# Patient Record
Sex: Female | Born: 1950 | Race: White | Hispanic: No | Smoking: Current every day smoker
Health system: Southern US, Community
[De-identification: ages and names within clinical notes are randomized; demographics above are authoritative.]

## PROBLEM LIST (undated history)

## (undated) DIAGNOSIS — I2510 Atherosclerotic heart disease of native coronary artery without angina pectoris: Secondary | ICD-10-CM

## (undated) DIAGNOSIS — M1990 Unspecified osteoarthritis, unspecified site: Secondary | ICD-10-CM

## (undated) DIAGNOSIS — E7800 Pure hypercholesterolemia, unspecified: Secondary | ICD-10-CM

## (undated) DIAGNOSIS — E875 Hyperkalemia: Secondary | ICD-10-CM

## (undated) DIAGNOSIS — I10 Essential (primary) hypertension: Secondary | ICD-10-CM

## (undated) DIAGNOSIS — E119 Type 2 diabetes mellitus without complications: Secondary | ICD-10-CM

## (undated) DIAGNOSIS — I219 Acute myocardial infarction, unspecified: Secondary | ICD-10-CM

## (undated) DIAGNOSIS — C801 Malignant (primary) neoplasm, unspecified: Secondary | ICD-10-CM

## (undated) DIAGNOSIS — E559 Vitamin D deficiency, unspecified: Secondary | ICD-10-CM

## (undated) HISTORY — PX: ABDOMINAL HYSTERECTOMY: SHX81

## (undated) HISTORY — PX: OTHER SURGICAL HISTORY: SHX169

## (undated) HISTORY — PX: BUNIONECTOMY: SHX129

---

## 2003-10-25 ENCOUNTER — Emergency Department (HOSPITAL_COMMUNITY): Admission: EM | Admit: 2003-10-25 | Discharge: 2003-10-25 | Payer: Self-pay | Admitting: Emergency Medicine

## 2006-12-18 ENCOUNTER — Ambulatory Visit (HOSPITAL_COMMUNITY): Admission: RE | Admit: 2006-12-18 | Discharge: 2006-12-18 | Payer: Self-pay | Admitting: Internal Medicine

## 2007-01-04 ENCOUNTER — Other Ambulatory Visit: Admission: RE | Admit: 2007-01-04 | Discharge: 2007-01-04 | Payer: Self-pay | Admitting: Internal Medicine

## 2008-02-25 ENCOUNTER — Ambulatory Visit (HOSPITAL_COMMUNITY): Admission: RE | Admit: 2008-02-25 | Discharge: 2008-02-25 | Payer: Self-pay

## 2008-02-25 ENCOUNTER — Other Ambulatory Visit: Admission: RE | Admit: 2008-02-25 | Discharge: 2008-02-25 | Payer: Self-pay | Admitting: Internal Medicine

## 2010-09-25 ENCOUNTER — Encounter: Payer: Self-pay | Admitting: Internal Medicine

## 2014-01-29 ENCOUNTER — Other Ambulatory Visit (HOSPITAL_COMMUNITY): Payer: Self-pay | Admitting: Family Medicine

## 2014-01-29 DIAGNOSIS — Z0000 Encounter for general adult medical examination without abnormal findings: Secondary | ICD-10-CM

## 2014-02-02 ENCOUNTER — Ambulatory Visit (HOSPITAL_COMMUNITY): Payer: Self-pay

## 2014-02-06 ENCOUNTER — Ambulatory Visit (HOSPITAL_COMMUNITY)
Admission: RE | Admit: 2014-02-06 | Discharge: 2014-02-06 | Disposition: A | Discharge status: Home / Self Care | Payer: BC Managed Care – PPO | Source: Ambulatory Visit | Attending: Family Medicine | Admitting: Family Medicine

## 2014-02-06 DIAGNOSIS — Z0000 Encounter for general adult medical examination without abnormal findings: Secondary | ICD-10-CM

## 2014-02-06 DIAGNOSIS — Z1231 Encounter for screening mammogram for malignant neoplasm of breast: Secondary | ICD-10-CM | POA: Insufficient documentation

## 2014-04-16 ENCOUNTER — Telehealth: Payer: Self-pay

## 2014-04-16 NOTE — Telephone Encounter (Signed)
LMOM to call back

## 2014-04-21 NOTE — Telephone Encounter (Signed)
PT ws referred by Delman Cheadle , PA at [facility] for screening colonoscopy. She has not responded to letter or phone call. Letter to PCP.

## 2014-08-26 ENCOUNTER — Ambulatory Visit (HOSPITAL_COMMUNITY)
Admission: RE | Admit: 2014-08-26 | Discharge: 2014-08-26 | Disposition: A | Discharge status: Home / Self Care | Payer: BC Managed Care – PPO | Source: Ambulatory Visit | Attending: Family Medicine | Admitting: Family Medicine

## 2014-08-26 ENCOUNTER — Other Ambulatory Visit (HOSPITAL_COMMUNITY): Payer: Self-pay | Admitting: Family Medicine

## 2014-08-26 DIAGNOSIS — E119 Type 2 diabetes mellitus without complications: Secondary | ICD-10-CM | POA: Diagnosis not present

## 2014-08-26 DIAGNOSIS — F17290 Nicotine dependence, other tobacco product, uncomplicated: Secondary | ICD-10-CM

## 2014-08-26 DIAGNOSIS — E1165 Type 2 diabetes mellitus with hyperglycemia: Secondary | ICD-10-CM

## 2014-08-26 DIAGNOSIS — I10 Essential (primary) hypertension: Secondary | ICD-10-CM | POA: Insufficient documentation

## 2014-08-26 DIAGNOSIS — E782 Mixed hyperlipidemia: Secondary | ICD-10-CM

## 2014-08-26 DIAGNOSIS — IMO0002 Reserved for concepts with insufficient information to code with codable children: Secondary | ICD-10-CM

## 2014-08-26 DIAGNOSIS — R05 Cough: Secondary | ICD-10-CM | POA: Diagnosis not present

## 2015-03-06 ENCOUNTER — Encounter (HOSPITAL_COMMUNITY): Payer: Self-pay | Admitting: Emergency Medicine

## 2015-03-06 ENCOUNTER — Emergency Department (HOSPITAL_COMMUNITY)
Admission: EM | Admit: 2015-03-06 | Discharge: 2015-03-06 | Disposition: A | Discharge status: Home / Self Care | Payer: Self-pay | Attending: Emergency Medicine | Admitting: Emergency Medicine

## 2015-03-06 DIAGNOSIS — Y998 Other external cause status: Secondary | ICD-10-CM | POA: Insufficient documentation

## 2015-03-06 DIAGNOSIS — E119 Type 2 diabetes mellitus without complications: Secondary | ICD-10-CM | POA: Insufficient documentation

## 2015-03-06 DIAGNOSIS — Y9389 Activity, other specified: Secondary | ICD-10-CM | POA: Insufficient documentation

## 2015-03-06 DIAGNOSIS — Y9289 Other specified places as the place of occurrence of the external cause: Secondary | ICD-10-CM | POA: Insufficient documentation

## 2015-03-06 DIAGNOSIS — Z720 Tobacco use: Secondary | ICD-10-CM | POA: Insufficient documentation

## 2015-03-06 DIAGNOSIS — M795 Residual foreign body in soft tissue: Secondary | ICD-10-CM

## 2015-03-06 DIAGNOSIS — I10 Essential (primary) hypertension: Secondary | ICD-10-CM | POA: Insufficient documentation

## 2015-03-06 DIAGNOSIS — S70361A Insect bite (nonvenomous), right thigh, initial encounter: Secondary | ICD-10-CM | POA: Insufficient documentation

## 2015-03-06 DIAGNOSIS — W57XXXA Bitten or stung by nonvenomous insect and other nonvenomous arthropods, initial encounter: Secondary | ICD-10-CM | POA: Insufficient documentation

## 2015-03-06 HISTORY — DX: Type 2 diabetes mellitus without complications: E11.9

## 2015-03-06 HISTORY — DX: Essential (primary) hypertension: I10

## 2015-03-06 HISTORY — DX: Pure hypercholesterolemia, unspecified: E78.00

## 2015-03-06 MED ORDER — DOXYCYCLINE HYCLATE 100 MG PO CAPS: 100.0000 mg | ORAL_CAPSULE | Freq: Two times a day (BID) | ORAL | Status: DC

## 2015-03-06 NOTE — ED Notes (Signed)
Pt reports removed a tick from left thigh today. Pt reports pain to left thigh ever since. Small area of redness noted to anterior thigh.

## 2015-03-06 NOTE — Discharge Instructions (Signed)
Tick Bite Information Ticks are insects that attach themselves to the skin and draw blood for food. There are various types of ticks. Common types include wood ticks and deer ticks. Most ticks live in shrubs and grassy areas. Ticks can climb onto your body when you make contact with leaves or grass where the tick is waiting. The most common places on the body for ticks to attach themselves are the scalp, neck, armpits, waist, and groin. Most tick bites are harmless, but sometimes ticks carry germs that cause diseases. These germs can be spread to a person during the tick's feeding process. The chance of a disease spreading through a tick bite depends on:   The type of tick.  Time of year.   How long the tick is attached.   Geographic location.  HOW CAN YOU PREVENT TICK BITES? Take these steps to help prevent tick bites when you are outdoors:  Wear protective clothing. Long sleeves and long pants are best.   Wear white clothes so you can see ticks more easily.  Tuck your pant legs into your socks.   If walking on a trail, stay in the middle of the trail to avoid brushing against bushes.  Avoid walking through areas with long grass.  Put insect repellent on all exposed skin and along boot tops, pant legs, and sleeve cuffs.   Check clothing, hair, and skin repeatedly and before going inside.   Brush off any ticks that are not attached.  Take a shower or bath as soon as possible after being outdoors.  WHAT IS THE PROPER WAY TO REMOVE A TICK? Ticks should be removed as soon as possible to help prevent diseases caused by tick bites. 1. If latex gloves are available, put them on before trying to remove a tick.  2. Using fine-point tweezers, grasp the tick as close to the skin as possible. You may also use curved forceps or a tick removal tool. Grasp the tick as close to its head as possible. Avoid grasping the tick on its body. 3. Pull gently with steady upward pressure until  the tick lets go. Do not twist the tick or jerk it suddenly. This may break off the tick's head or mouth parts. 4. Do not squeeze or crush the tick's body. This could force disease-carrying fluids from the tick into your body.  5. After the tick is removed, wash the bite area and your hands with soap and water or other disinfectant such as alcohol. 6. Apply a small amount of antiseptic cream or ointment to the bite site.  7. Wash and disinfect any instruments that were used.  Do not try to remove a tick by applying a hot match, petroleum jelly, or fingernail polish to the tick. These methods do not work and may increase the chances of disease being spread from the tick bite.  WHEN SHOULD YOU SEEK MEDICAL CARE? Contact your health care provider if you are unable to remove a tick from your skin or if a part of the tick breaks off and is stuck in the skin.  After a tick bite, you need to be aware of signs and symptoms that could be related to diseases spread by ticks. Contact your health care provider if you develop any of the following in the days or weeks after the tick bite:  Unexplained fever.  Rash. A circular rash that appears days or weeks after the tick bite may indicate the possibility of Lyme disease. The rash may resemble   a target with a bull's-eye and may occur at a different part of your body than the tick bite.  Redness and swelling in the area of the tick bite.   Tender, swollen lymph glands.   Diarrhea.   Weight loss.   Cough.   Fatigue.   Muscle, joint, or bone pain.   Abdominal pain.   Headache.   Lethargy or a change in your level of consciousness.  Difficulty walking or moving your legs.   Numbness in the legs.   Paralysis.  Shortness of breath.   Confusion.   Repeated vomiting.  Document Released: 08/18/2000 Document Revised: 06/11/2013 Document Reviewed: 01/29/2013 [employer] Patient Information 2015 [employer]. This information is  not intended to replace advice given to you by your health care provider. Make sure you discuss any questions you have with your health care provider.  

## 2015-03-07 NOTE — ED Provider Notes (Signed)
CSN: [id number]     Arrival date & time 03/06/15  1656 History   First MD Initiated Contact with Patient 03/06/15 1714     Chief Complaint  Patient presents with  . Insect Bite     (Consider location/radiation/quality/duration/timing/severity/associated sxs/prior Treatment) The history is provided by the patient.   Lisa Jenkins is a 64 y.o. female with a history of DM and HTN presenting for evaluation of tick bite to her left upper thigh. She discovered the firmly embedded tick earlier this evening and removed it using a tweezers but is concerned about possible retained pincher.  She has pain and redness around the site of the bite.  She denies fever but had chills this am, denies rash, headache, nausea or vomiting.  Her cbg today was 49.     Past Medical History  Diagnosis Date  . Diabetes mellitus without complication   . High cholesterol   . Hypertension    History reviewed. No pertinent past surgical history. History reviewed. No pertinent family history. History  Substance Use Topics  . Smoking status: Current Every Day Smoker -- 0.50 packs/day  . Smokeless tobacco: Not on file  . Alcohol Use: No   OB History    No data available     Review of Systems  Constitutional: Positive for chills. Negative for fever.  HENT: Negative.  Negative for sore throat.   Respiratory: Negative for shortness of breath.   Cardiovascular: Negative for chest pain.  Gastrointestinal: Negative for nausea and abdominal pain.  Genitourinary: Negative.   Musculoskeletal: Negative for joint swelling and arthralgias.  Skin: Positive for wound. Negative for rash.  Neurological: Negative for weakness, light-headedness, numbness and headaches.  Psychiatric/Behavioral: Negative.       Allergies  Review of patient's allergies indicates not on file.  Home Medications   Prior to Admission medications   Medication Sig Start Date End Date Taking? Authorizing Provider  doxycycline (VIBRAMYCIN)  100 MG capsule Take 1 capsule (100 mg total) by mouth 2 (two) times daily. 03/06/15   Lisa Jefferson, PA-C   BP 148/73 mmHg  Pulse 112  Temp(Src) 98.6 F (37 C) (Oral)  Resp 18  Ht 5\' 2"  (1.575 m)  Wt 165 lb (74.844 kg)  BMI 30.17 kg/m2  SpO2 98% Physical Exam  Constitutional: She appears well-developed and well-nourished. No distress.  HENT:  Head: Normocephalic.  Neck: Neck supple.  Cardiovascular: Normal rate.   Pulmonary/Chest: Effort normal. She has no wheezes.  Musculoskeletal: Normal range of motion. She exhibits no edema.  Skin: There is erythema.  Indurated lesion left upper anterior thigh with a non blanching 2 cm surrounding area of erythema.  Central punctum has a dark suspected fb.  No drainage, no red streaking.    ED Course  FOREIGN BODY REMOVAL Date/Time: 03/06/2015 5:30 PM Performed by: Lisa Jefferson Authorized by: Lisa Jefferson Consent: Verbal consent obtained. Risks and benefits: risks, benefits and alternatives were discussed Consent given by: patient Patient identity confirmed: verbally with patient Body area: skin Anesthesia method: none. Patient sedated: no Localization method: magnification Removal mechanism: forceps Depth: subcutaneous Complexity: simple 1 objects recovered. Objects recovered: tick pincher Post-procedure assessment: foreign body removed Patient tolerance: Patient tolerated the procedure well with no immediate complications   (including critical care time) Labs Review Labs Reviewed - No data to display  Imaging Review No results found.   EKG Interpretation None      MDM   Final diagnoses:  Tick bite  Foreign body (FB) in  soft tissue    Pt tolerated removal of foreign body well.  She was placed on doxycycline. Advised to use soap and water to keep site clean.  Prn f/u anticipated.  Advised f/u with pcp for any worsened or new sx.    Lisa Jefferson, PA-C 03/07/15 [place], DO 03/09/15 1622

## 2015-07-21 ENCOUNTER — Other Ambulatory Visit: Payer: Self-pay | Admitting: Physician Assistant

## 2015-07-21 ENCOUNTER — Ambulatory Visit: Payer: Self-pay | Admitting: Physician Assistant

## 2015-07-21 ENCOUNTER — Encounter: Payer: Self-pay | Admitting: Physician Assistant

## 2015-07-21 VITALS — BP 144/72 | HR 78 | Temp 98.1°F | Ht 62.0 in | Wt 164.0 lb

## 2015-07-21 DIAGNOSIS — E785 Hyperlipidemia, unspecified: Secondary | ICD-10-CM | POA: Insufficient documentation

## 2015-07-21 DIAGNOSIS — E118 Type 2 diabetes mellitus with unspecified complications: Secondary | ICD-10-CM

## 2015-07-21 DIAGNOSIS — F17210 Nicotine dependence, cigarettes, uncomplicated: Secondary | ICD-10-CM | POA: Insufficient documentation

## 2015-07-21 DIAGNOSIS — E11319 Type 2 diabetes mellitus with unspecified diabetic retinopathy without macular edema: Secondary | ICD-10-CM | POA: Insufficient documentation

## 2015-07-21 DIAGNOSIS — I10 Essential (primary) hypertension: Secondary | ICD-10-CM | POA: Insufficient documentation

## 2015-07-21 DIAGNOSIS — R809 Proteinuria, unspecified: Secondary | ICD-10-CM

## 2015-07-21 DIAGNOSIS — E039 Hypothyroidism, unspecified: Secondary | ICD-10-CM | POA: Insufficient documentation

## 2015-07-21 MED ORDER — AMLODIPINE BESYLATE 5 MG PO TABS: 5.0000 mg | ORAL_TABLET | Freq: Every day | ORAL | Status: DC

## 2015-07-21 NOTE — Progress Notes (Signed)
BP 144/72 mmHg  Pulse 78  Temp(Src) 98.1 F (36.7 C)  Ht 5\' 2"  (1.575 m)  Wt 164 lb (74.39 kg)  BMI 29.99 kg/m2  SpO2 97%   Subjective:    Patient ID: Lisa Jenkins, female    DOB: 1951/08/29, 64 y.o.   MRN: [id number]  HPI: Lisa Jenkins is a 64 y.o. female presenting on 07/21/2015 for Diabetes and Hypothyroidism   HPI   Pt is taking 2 different rx of levothyroxin. Pt got labs drawn on way to her appt here.  Relevant past medical, surgical, family and social history reviewed and updated as indicated. Interim medical history since our last visit reviewed. Allergies and medications reviewed and updated.   Current outpatient prescriptions:  .  aspirin 81 MG chewable tablet, Chew by mouth daily., Disp: , Rfl:  .  insulin aspart (NOVOLOG) 100 UNIT/ML injection, Inject into the skin 3 (three) times daily before meals., Disp: , Rfl:  .  insulin glargine (LANTUS) 100 UNIT/ML injection, Inject 14 Units into the skin at bedtime., Disp: , Rfl:  .  levothyroxine (SYNTHROID, LEVOTHROID) 25 MCG tablet, Take 25 mcg by mouth daily before breakfast., Disp: , Rfl:  .  lisinopril (PRINIVIL,ZESTRIL) 40 MG tablet, Take 40 mg by mouth daily., Disp: , Rfl:  .  rosuvastatin (CRESTOR) 20 MG tablet, Take 20 mg by mouth Nightly., Disp: , Rfl:  .  sitaGLIPtin-metformin (JANUMET) 50-1000 MG tablet, Take 1 tablet by mouth 2 (two) times daily with a meal., Disp: , Rfl:   Review of Systems  Constitutional: Negative for fever, chills, diaphoresis, appetite change, fatigue and unexpected weight change.  HENT: Negative for congestion, dental problem, drooling, ear pain, facial swelling, hearing loss, mouth sores, sneezing, sore throat, trouble swallowing and voice change.   Eyes: Positive for visual disturbance. Negative for pain, discharge, redness and itching.  Respiratory: Positive for wheezing. Negative for cough, choking and shortness of breath.   Cardiovascular: Positive for leg swelling. Negative for  chest pain and palpitations.  Gastrointestinal: Negative for vomiting, abdominal pain, diarrhea, constipation and blood in stool.  Endocrine: Negative for cold intolerance, heat intolerance and polydipsia.  Genitourinary: Negative for dysuria, hematuria and decreased urine volume.  Musculoskeletal: Negative for back pain, arthralgias and gait problem.  Skin: Negative for rash.  Allergic/Immunologic: Negative for environmental allergies.  Neurological: Negative for seizures, syncope, light-headedness and headaches.  Hematological: Negative for adenopathy.  Psychiatric/Behavioral: Negative for suicidal ideas, dysphoric mood and agitation. The patient is not nervous/anxious.     Per HPI unless specifically indicated above     Objective:    BP 144/72 mmHg  Pulse 78  Temp(Src) 98.1 F (36.7 C)  Ht 5\' 2"  (1.575 m)  Wt 164 lb (74.39 kg)  BMI 29.99 kg/m2  SpO2 97%  Wt Readings from Last 3 Encounters:  07/21/15 164 lb (74.39 kg)  03/06/15 165 lb (74.844 kg)    Physical Exam  Constitutional: She is oriented to person, place, and time. She appears well-developed and well-nourished.  HENT:  Head: Normocephalic and atraumatic.  Neck: Neck supple.  Cardiovascular: Normal rate and regular rhythm.   Pulmonary/Chest: Effort normal and breath sounds normal.  Abdominal: Soft. Bowel sounds are normal. She exhibits no mass. There is no tenderness.  Lymphadenopathy:    She has no cervical adenopathy.  Neurological: She is alert and oriented to person, place, and time.  Skin: Skin is warm and dry.  Psychiatric: She has a normal mood and affect. Her behavior is  normal.  Vitals reviewed.  Foot exam done  No results found for this or any previous visit.    Assessment & Plan:   Encounter Diagnoses  Name Primary?  . Type 2 diabetes mellitus with complication, unspecified long term insulin use status ([place]) Yes  . Type 2 diabetes mellitus with retinopathy, macular edema presence unspecified,  unspecified laterality, unspecified long term insulin use status, unspecified retinopathy severity ([place])   . Hyperlipemia   . Essential hypertension, benign   . Cigarette nicotine dependence, uncomplicated   . Proteinuria   . Hypothyroidism, unspecified hypothyroidism type     -Pt counseled to stop the other thryoid med (the duplicate) -Cont current meds -Will call with lab results -Counseled smoking cessation -Add amlodipine for bp  -Pt still needs to attend dm educ class (has been pt here since January 2016 and has still not attended) -F/u 3 mo

## 2015-07-21 NOTE — Patient Instructions (Signed)
Smoking Cessation, Tips for Success If you are ready to quit smoking, congratulations! You have chosen to help yourself be healthier. Cigarettes bring nicotine, tar, carbon monoxide, and other irritants into your body. Your lungs, heart, and blood vessels will be able to work better without these poisons. There are many different ways to quit smoking. Nicotine gum, nicotine patches, a nicotine inhaler, or nicotine nasal spray can help with physical craving. Hypnosis, support groups, and medicines help break the habit of smoking. WHAT THINGS CAN I DO TO MAKE QUITTING EASIER?  Here are some tips to help you quit for good:  Pick a date when you will quit smoking completely. Tell all of your friends and family about your plan to quit on that date.  Do not try to slowly cut down on the number of cigarettes you are smoking. Pick a quit date and quit smoking completely starting on that day.  Throw away all cigarettes.   Clean and remove all ashtrays from your home, work, and car.  On a card, write down your reasons for quitting. Carry the card with you and read it when you get the urge to smoke.  Cleanse your body of nicotine. Drink enough water and fluids to keep your urine clear or pale yellow. Do this after quitting to flush the nicotine from your body.  Learn to predict your moods. Do not let a bad situation be your excuse to have a cigarette. Some situations in your life might tempt you into wanting a cigarette.  Never have "just one" cigarette. It leads to wanting another and another. Remind yourself of your decision to quit.  Change habits associated with smoking. If you smoked while driving or when feeling stressed, try other activities to replace smoking. Stand up when drinking your coffee. Brush your teeth after eating. Sit in a different chair when you read the paper. Avoid alcohol while trying to quit, and try to drink fewer caffeinated beverages. Alcohol and caffeine may urge you to  smoke.  Avoid foods and drinks that can trigger a desire to smoke, such as sugary or spicy foods and alcohol.  Ask people who smoke not to smoke around you.  Have something planned to do right after eating or having a cup of coffee. For example, plan to take a walk or exercise.  Try a relaxation exercise to calm you down and decrease your stress. Remember, you may be tense and nervous for the first 2 weeks after you quit, but this will pass.  Find new activities to keep your hands busy. Play with a pen, coin, or rubber band. Doodle or draw things on paper.  Brush your teeth right after eating. This will help cut down on the craving for the taste of tobacco after meals. You can also try mouthwash.   Use oral substitutes in place of cigarettes. Try using lemon drops, carrots, cinnamon sticks, or chewing gum. Keep them handy so they are available when you have the urge to smoke.  When you have the urge to smoke, try deep breathing.  Designate your home as a nonsmoking area.  If you are a heavy smoker, ask your health care provider about a prescription for nicotine chewing gum. It can ease your withdrawal from nicotine.  Reward yourself. Set aside the cigarette money you save and buy yourself something nice.  Look for support from others. Join a support group or smoking cessation program. Ask someone at home or at work to help you with your plan   to quit smoking.  Always ask yourself, "Do I need this cigarette or is this just a reflex?" Tell yourself, "Today, I choose not to smoke," or "I do not want to smoke." You are reminding yourself of your decision to quit.  Do not replace cigarette smoking with electronic cigarettes (commonly called e-cigarettes). The safety of e-cigarettes is unknown, and some may contain harmful chemicals.  If you relapse, do not give up! Plan ahead and think about what you will do the next time you get the urge to smoke. HOW WILL I FEEL WHEN I QUIT SMOKING? You  may have symptoms of withdrawal because your body is used to nicotine (the addictive substance in cigarettes). You may crave cigarettes, be irritable, feel very hungry, cough often, get headaches, or have difficulty concentrating. The withdrawal symptoms are only temporary. They are strongest when you first quit but will go away within 10-14 days. When withdrawal symptoms occur, stay in control. Think about your reasons for quitting. Remind yourself that these are signs that your body is healing and getting used to being without cigarettes. Remember that withdrawal symptoms are easier to treat than the major diseases that smoking can cause.  Even after the withdrawal is over, expect periodic urges to smoke. However, these cravings are generally short lived and will go away whether you smoke or not. Do not smoke! WHAT RESOURCES ARE AVAILABLE TO HELP ME QUIT SMOKING? Your health care provider can direct you to community resources or hospitals for support, which may include:  Group support.  Education.  Hypnosis.  Therapy.   This information is not intended to replace advice given to you by your health care provider. Make sure you discuss any questions you have with your health care provider.   Document Released: 05/19/2004 Document Revised: 09/11/2014 Document Reviewed: 02/06/2013 Elsevier Interactive Patient Education 2016 Else[employer] Inc.  

## 2015-07-22 ENCOUNTER — Ambulatory Visit: Payer: Self-pay | Admitting: Physician Assistant

## 2015-07-22 LAB — COMPREHENSIVE METABOLIC PANEL
ALBUMIN: 3.5 g/dL — AB (ref 3.6–5.1)
ALK PHOS: 70 U/L (ref 33–130)
ALT: 6 U/L (ref 6–29)
AST: 10 U/L (ref 10–35)
BILIRUBIN TOTAL: 0.5 mg/dL (ref 0.2–1.2)
BUN: 8 mg/dL (ref 7–25)
CO2: 26 mmol/L (ref 20–31)
CREATININE: 0.62 mg/dL (ref 0.50–0.99)
Calcium: 9.1 mg/dL (ref 8.6–10.4)
Chloride: 103 mmol/L (ref 98–110)
Glucose, Bld: 127 mg/dL — ABNORMAL HIGH (ref 65–99)
Potassium: 4.4 mmol/L (ref 3.5–5.3)
SODIUM: 138 mmol/L (ref 135–146)
TOTAL PROTEIN: 6 g/dL — AB (ref 6.1–8.1)

## 2015-07-22 LAB — LIPID PANEL
CHOLESTEROL: 135 mg/dL (ref 125–200)
HDL: 47 mg/dL (ref >=46)
LDL Cholesterol: 61 mg/dL (ref <130)
TRIGLYCERIDES: 136 mg/dL (ref <150)
Total CHOL/HDL Ratio: 2.9 Ratio (ref <=5.0)
VLDL: 27 mg/dL (ref <30)

## 2015-07-22 LAB — TSH: TSH: 1.96 u[IU]/mL (ref 0.350–4.500)

## 2015-07-22 LAB — HEMOGLOBIN A1C
HEMOGLOBIN A1C: 8.1 % — AB (ref <5.7)
MEAN PLASMA GLUCOSE: 186 mg/dL — AB (ref <117)

## 2015-08-01 DIAGNOSIS — E118 Type 2 diabetes mellitus with unspecified complications: Secondary | ICD-10-CM | POA: Insufficient documentation

## 2015-08-17 ENCOUNTER — Other Ambulatory Visit: Payer: Self-pay | Admitting: Physician Assistant

## 2015-08-18 ENCOUNTER — Other Ambulatory Visit: Payer: Self-pay | Admitting: Physician Assistant

## 2015-08-18 MED ORDER — ATORVASTATIN CALCIUM 20 MG PO TABS: 20.0000 mg | ORAL_TABLET | Freq: Every day | ORAL | Status: DC

## 2015-10-15 ENCOUNTER — Other Ambulatory Visit: Payer: Self-pay | Admitting: Physician Assistant

## 2015-10-21 ENCOUNTER — Encounter: Payer: Self-pay | Admitting: Physician Assistant

## 2015-10-21 ENCOUNTER — Ambulatory Visit: Payer: Self-pay | Admitting: Physician Assistant

## 2015-10-21 VITALS — BP 122/64 | HR 85 | Temp 98.1°F | Ht 62.0 in | Wt 161.7 lb

## 2015-10-21 DIAGNOSIS — E118 Type 2 diabetes mellitus with unspecified complications: Principal | ICD-10-CM

## 2015-10-21 DIAGNOSIS — F17210 Nicotine dependence, cigarettes, uncomplicated: Secondary | ICD-10-CM

## 2015-10-21 DIAGNOSIS — E785 Hyperlipidemia, unspecified: Secondary | ICD-10-CM

## 2015-10-21 DIAGNOSIS — I10 Essential (primary) hypertension: Secondary | ICD-10-CM

## 2015-10-21 DIAGNOSIS — Z1211 Encounter for screening for malignant neoplasm of colon: Secondary | ICD-10-CM

## 2015-10-21 DIAGNOSIS — E039 Hypothyroidism, unspecified: Secondary | ICD-10-CM

## 2015-10-21 DIAGNOSIS — E1165 Type 2 diabetes mellitus with hyperglycemia: Secondary | ICD-10-CM

## 2015-10-21 DIAGNOSIS — IMO0002 Reserved for concepts with insufficient information to code with codable children: Secondary | ICD-10-CM | POA: Insufficient documentation

## 2015-10-21 NOTE — Patient Instructions (Signed)
GET BLOOD DRAWN TODAY OR TOMORROW  Smoking Cessation, Tips for Success If you are ready to quit smoking, congratulations! You have chosen to help yourself be healthier. Cigarettes bring nicotine, tar, carbon monoxide, and other irritants into your body. Your lungs, heart, and blood vessels will be able to work better without these poisons. There are many different ways to quit smoking. Nicotine gum, nicotine patches, a nicotine inhaler, or nicotine nasal spray can help with physical craving. Hypnosis, support groups, and medicines help break the habit of smoking. WHAT THINGS CAN I DO TO MAKE QUITTING EASIER?  Here are some tips to help you quit for good:  Pick a date when you will quit smoking completely. Tell all of your friends and family about your plan to quit on that date.  Do not try to slowly cut down on the number of cigarettes you are smoking. Pick a quit date and quit smoking completely starting on that day.  Throw away all cigarettes.   Clean and remove all ashtrays from your home, work, and car.  On a card, write down your reasons for quitting. Carry the card with you and read it when you get the urge to smoke.  Cleanse your body of nicotine. Drink enough water and fluids to keep your urine clear or pale yellow. Do this after quitting to flush the nicotine from your body.  Learn to predict your moods. Do not let a bad situation be your excuse to have a cigarette. Some situations in your life might tempt you into wanting a cigarette.  Never have "just one" cigarette. It leads to wanting another and another. Remind yourself of your decision to quit.  Change habits associated with smoking. If you smoked while driving or when feeling stressed, try other activities to replace smoking. Stand up when drinking your coffee. Brush your teeth after eating. Sit in a different chair when you read the paper. Avoid alcohol while trying to quit, and try to drink fewer caffeinated beverages.  Alcohol and caffeine may urge you to smoke.  Avoid foods and drinks that can trigger a desire to smoke, such as sugary or spicy foods and alcohol.  Ask people who smoke not to smoke around you.  Have something planned to do right after eating or having a cup of coffee. For example, plan to take a walk or exercise.  Try a relaxation exercise to calm you down and decrease your stress. Remember, you may be tense and nervous for the first 2 weeks after you quit, but this will pass.  Find new activities to keep your hands busy. Play with a pen, coin, or rubber band. Doodle or draw things on paper.  Brush your teeth right after eating. This will help cut down on the craving for the taste of tobacco after meals. You can also try mouthwash.   Use oral substitutes in place of cigarettes. Try using lemon drops, carrots, cinnamon sticks, or chewing gum. Keep them handy so they are available when you have the urge to smoke.  When you have the urge to smoke, try deep breathing.  Designate your home as a nonsmoking area.  If you are a heavy smoker, ask your health care provider about a prescription for nicotine chewing gum. It can ease your withdrawal from nicotine.  Reward yourself. Set aside the cigarette money you save and buy yourself something nice.  Look for support from others. Join a support group or smoking cessation program. Ask someone at home or at  work to help you with your plan to quit smoking.  Always ask yourself, "Do I need this cigarette or is this just a reflex?" Tell yourself, "Today, I choose not to smoke," or "I do not want to smoke." You are reminding yourself of your decision to quit.  Do not replace cigarette smoking with electronic cigarettes (commonly called e-cigarettes). The safety of e-cigarettes is unknown, and some may contain harmful chemicals.  If you relapse, do not give up! Plan ahead and think about what you will do the next time you get the urge to smoke. HOW  WILL I FEEL WHEN I QUIT SMOKING? You may have symptoms of withdrawal because your body is used to nicotine (the addictive substance in cigarettes). You may crave cigarettes, be irritable, feel very hungry, cough often, get headaches, or have difficulty concentrating. The withdrawal symptoms are only temporary. They are strongest when you first quit but will go away within 10-14 days. When withdrawal symptoms occur, stay in control. Think about your reasons for quitting. Remind yourself that these are signs that your body is healing and getting used to being without cigarettes. Remember that withdrawal symptoms are easier to treat than the major diseases that smoking can cause.  Even after the withdrawal is over, expect periodic urges to smoke. However, these cravings are generally short lived and will go away whether you smoke or not. Do not smoke! WHAT RESOURCES ARE AVAILABLE TO HELP ME QUIT SMOKING? Your health care provider can direct you to community resources or hospitals for support, which may include:  Group support.  Education.  Hypnosis.  Therapy.   This information is not intended to replace advice given to you by your health care provider. Make sure you discuss any questions you have with your health care provider.   Document Released: 05/19/2004 Document Revised: 09/11/2014 Document Reviewed: 02/06/2013 Elsevier Interactive Patient Education [employer].

## 2015-10-21 NOTE — Progress Notes (Signed)
BP 122/64 mmHg  Pulse 85  Temp(Src) 98.1 F (36.7 C)  Ht 5\' 2"  (1.575 m)  Wt 161 lb 11.2 oz (73.347 kg)  BMI 29.57 kg/m2  SpO2 99%   Subjective:    Patient ID: Lisa Jenkins, female    DOB: 10-15-1950, 65 y.o.   MRN: [id number]  HPI: Lisa Jenkins is a 65 y.o. female presenting on 10/21/2015 for Diabetes and Hypertension   HPI   Pt feeling well today and has no new complaints Reviewed bs logs- look good- mostly in lower 100-120s.  Relevant past medical, surgical, family and social history reviewed and updated as indicated. Interim medical history since our last visit reviewed. Allergies and medications reviewed and updated.   Current outpatient prescriptions:  .  amLODipine (NORVASC) 5 MG tablet, Take 1 tablet (5 mg total) by mouth daily., Disp: 90 tablet, Rfl: 2 .  aspirin 81 MG chewable tablet, Chew by mouth daily., Disp: , Rfl:  .  atorvastatin (LIPITOR) 20 MG tablet, Take 1 tablet (20 mg total) by mouth daily., Disp: 90 tablet, Rfl: 3 .  insulin aspart (NOVOLOG) 100 UNIT/ML injection, Inject into the skin 3 (three) times daily before meals. Sliding scalte- (scale 3, Disp: , Rfl:  .  insulin glargine (LANTUS) 100 UNIT/ML injection, Inject 14 Units into the skin at bedtime., Disp: , Rfl:  .  levothyroxine (SYNTHROID, LEVOTHROID) 25 MCG tablet, Take 25 mcg by mouth daily before breakfast., Disp: , Rfl:  .  lisinopril (PRINIVIL,ZESTRIL) 40 MG tablet, TAKE 1 Tablet BY MOUTH ONCE DAILY FOR BLOOD PRESSURE, Disp: 90 tablet, Rfl: 1 .  sitaGLIPtin-metformin (JANUMET) 50-1000 MG tablet, Take 1 tablet by mouth 2 (two) times daily with a meal., Disp: , Rfl:    Review of Systems  Constitutional: Positive for chills. Negative for fever, diaphoresis, appetite change, fatigue and unexpected weight change.  HENT: Negative for congestion, dental problem, drooling, ear pain, facial swelling, hearing loss, mouth sores, sneezing, sore throat, trouble swallowing and voice change.   Eyes: Positive  for visual disturbance. Negative for pain, discharge, redness and itching.  Respiratory: Positive for cough. Negative for choking, shortness of breath and wheezing.   Cardiovascular: Positive for leg swelling. Negative for chest pain and palpitations.  Gastrointestinal: Negative for vomiting, abdominal pain, diarrhea, constipation and blood in stool.  Endocrine: Positive for polydipsia. Negative for cold intolerance and heat intolerance.  Genitourinary: Negative for dysuria, hematuria and decreased urine volume.  Musculoskeletal: Positive for arthralgias. Negative for back pain and gait problem.  Skin: Negative for rash.  Allergic/Immunologic: Negative for environmental allergies.  Neurological: Negative for seizures, syncope, light-headedness and headaches.  Hematological: Negative for adenopathy.  Psychiatric/Behavioral: Negative for suicidal ideas, dysphoric mood and agitation. The patient is not nervous/anxious.     Per HPI unless specifically indicated above     Objective:    BP 122/64 mmHg  Pulse 85  Temp(Src) 98.1 F (36.7 C)  Ht 5\' 2"  (1.575 m)  Wt 161 lb 11.2 oz (73.347 kg)  BMI 29.57 kg/m2  SpO2 99%  Wt Readings from Last 3 Encounters:  10/21/15 161 lb 11.2 oz (73.347 kg)  07/21/15 164 lb (74.39 kg)  03/06/15 165 lb (74.844 kg)    Physical Exam  Constitutional: She is oriented to person, place, and time. She appears well-developed and well-nourished.  HENT:  Head: Normocephalic and atraumatic.  Neck: Neck supple.  Cardiovascular: Normal rate and regular rhythm.   Pulmonary/Chest: Effort normal and breath sounds normal.  Abdominal: Soft.  Bowel sounds are normal. She exhibits no mass. There is no hepatosplenomegaly. There is no tenderness.  Musculoskeletal: She exhibits no edema.  Lymphadenopathy:    She has no cervical adenopathy.  Neurological: She is alert and oriented to person, place, and time.  Skin: Skin is warm and dry.  Psychiatric: She has a normal mood  and affect. Her behavior is normal.  Vitals reviewed.       Assessment & Plan:   Encounter Diagnoses  Name Primary?  Marland Kitchen Uncontrolled type 2 diabetes mellitus with complication, unspecified long term insulin use status ([place]) Yes  . Essential hypertension, benign   . Screening for colon cancer   . Hypothyroidism, unspecified hypothyroidism type   . Hyperlipidemia   . Cigarette nicotine dependence, uncomplicated     -iFOBT given for colon cancer screening -pt still needs to attend DM educ class -Get labs drawn today- a1c and microalbumin- will call with results -counseled on smoking cessation -F/u 3 months.  RTO sooner prn

## 2015-11-08 ENCOUNTER — Other Ambulatory Visit: Payer: Self-pay | Admitting: Physician Assistant

## 2015-11-08 MED ORDER — SITAGLIPTIN PHOS-METFORMIN HCL 50-1000 MG PO TABS: 1.0000 | ORAL_TABLET | Freq: Two times a day (BID) | ORAL | Status: DC

## 2015-11-08 MED ORDER — LEVOTHYROXINE SODIUM 25 MCG PO TABS: 25.0000 ug | ORAL_TABLET | Freq: Every day | ORAL | Status: DC

## 2015-11-08 MED ORDER — AMLODIPINE BESYLATE 5 MG PO TABS: 5.0000 mg | ORAL_TABLET | Freq: Every day | ORAL | Status: DC

## 2015-11-08 MED ORDER — ATORVASTATIN CALCIUM 20 MG PO TABS: 20.0000 mg | ORAL_TABLET | Freq: Every day | ORAL | Status: DC

## 2016-01-11 ENCOUNTER — Other Ambulatory Visit: Payer: Self-pay

## 2016-01-11 DIAGNOSIS — I10 Essential (primary) hypertension: Secondary | ICD-10-CM

## 2016-01-11 DIAGNOSIS — E1165 Type 2 diabetes mellitus with hyperglycemia: Secondary | ICD-10-CM

## 2016-01-11 DIAGNOSIS — E118 Type 2 diabetes mellitus with unspecified complications: Principal | ICD-10-CM

## 2016-01-11 DIAGNOSIS — E785 Hyperlipidemia, unspecified: Secondary | ICD-10-CM

## 2016-01-19 ENCOUNTER — Ambulatory Visit: Payer: Self-pay | Admitting: Physician Assistant

## 2016-02-21 ENCOUNTER — Ambulatory Visit: Payer: Self-pay | Admitting: Physician Assistant

## 2016-03-09 ENCOUNTER — Encounter: Payer: Self-pay | Admitting: Physician Assistant

## 2016-05-25 ENCOUNTER — Other Ambulatory Visit: Payer: Self-pay | Admitting: Physician Assistant

## 2016-05-28 ENCOUNTER — Other Ambulatory Visit: Payer: Self-pay | Admitting: Physician Assistant

## 2016-05-28 DIAGNOSIS — E1165 Type 2 diabetes mellitus with hyperglycemia: Secondary | ICD-10-CM

## 2016-05-28 DIAGNOSIS — I10 Essential (primary) hypertension: Secondary | ICD-10-CM

## 2016-05-28 DIAGNOSIS — E785 Hyperlipidemia, unspecified: Secondary | ICD-10-CM

## 2016-05-28 DIAGNOSIS — E118 Type 2 diabetes mellitus with unspecified complications: Principal | ICD-10-CM

## 2016-05-28 DIAGNOSIS — E039 Hypothyroidism, unspecified: Secondary | ICD-10-CM

## 2016-05-30 LAB — LIPID PANEL
Cholesterol: 149 mg/dL (ref 125–200)
HDL: 43 mg/dL — ABNORMAL LOW (ref >=46)
LDL CALC: 89 mg/dL (ref <130)
Total CHOL/HDL Ratio: 3.5 Ratio (ref <=5.0)
Triglycerides: 86 mg/dL (ref <150)
VLDL: 17 mg/dL (ref <30)

## 2016-05-30 LAB — COMPREHENSIVE METABOLIC PANEL
ALBUMIN: 3.8 g/dL (ref 3.6–5.1)
ALK PHOS: 81 U/L (ref 33–130)
ALT: 5 U/L — ABNORMAL LOW (ref 6–29)
AST: 8 U/L — AB (ref 10–35)
BILIRUBIN TOTAL: 0.4 mg/dL (ref 0.2–1.2)
BUN: 11 mg/dL (ref 7–25)
CALCIUM: 8.9 mg/dL (ref 8.6–10.4)
CO2: 21 mmol/L (ref 20–31)
Chloride: 108 mmol/L (ref 98–110)
Creat: 0.82 mg/dL (ref 0.50–0.99)
Glucose, Bld: 102 mg/dL — ABNORMAL HIGH (ref 65–99)
Potassium: 5 mmol/L (ref 3.5–5.3)
Sodium: 139 mmol/L (ref 135–146)
Total Protein: 6.2 g/dL (ref 6.1–8.1)

## 2016-05-30 LAB — TSH: TSH: 4.38 mIU/L

## 2016-05-31 ENCOUNTER — Encounter: Payer: Self-pay | Admitting: Physician Assistant

## 2016-05-31 ENCOUNTER — Ambulatory Visit: Payer: Self-pay | Admitting: Physician Assistant

## 2016-05-31 VITALS — BP 130/66 | HR 93 | Temp 98.1°F | Ht 62.0 in | Wt 158.0 lb

## 2016-05-31 DIAGNOSIS — F17210 Nicotine dependence, cigarettes, uncomplicated: Secondary | ICD-10-CM

## 2016-05-31 DIAGNOSIS — E039 Hypothyroidism, unspecified: Secondary | ICD-10-CM

## 2016-05-31 DIAGNOSIS — I10 Essential (primary) hypertension: Secondary | ICD-10-CM

## 2016-05-31 DIAGNOSIS — E785 Hyperlipidemia, unspecified: Secondary | ICD-10-CM

## 2016-05-31 DIAGNOSIS — Z1239 Encounter for other screening for malignant neoplasm of breast: Secondary | ICD-10-CM

## 2016-05-31 DIAGNOSIS — E118 Type 2 diabetes mellitus with unspecified complications: Secondary | ICD-10-CM

## 2016-05-31 LAB — HEMOGLOBIN A1C
Hgb A1c MFr Bld: 7.1 % — ABNORMAL HIGH (ref <5.7)
Mean Plasma Glucose: 157 mg/dL

## 2016-05-31 LAB — MICROALBUMIN, URINE: Microalb, Ur: 26.7 mg/dL

## 2016-05-31 NOTE — Progress Notes (Signed)
BP 130/66 (BP Location: Left Arm, Patient Position: Sitting, Cuff Size: Normal)   Pulse 93   Temp 98.1 F (36.7 C)   Ht 5\' 2"  (1.575 m)   Wt 158 lb (71.7 kg)   SpO2 98%   BMI 28.90 kg/m    Subjective:    Patient ID: Lisa Jenkins, female    DOB: [birth year], 65 y.o.   MRN: [id number]  HPI: Lisa Jenkins is a 65 y.o. female presenting on 05/31/2016 for Follow-up   HPI   Pt moved to [facility] for 5 months.  She moved back here recently.  Her last visit here was in February.  Pt brings in 7 Months of bs logs.  She has not been taking her novolog b/c her  bs have been good.    Pt says she is doing well and has no complaints today.  Relevant past medical, surgical, family and social history reviewed and updated as indicated. Interim medical history since our last visit reviewed. Allergies and medications reviewed and updated.   Current Outpatient Prescriptions:  .  amLODipine (NORVASC) 5 MG tablet, Take 1 tablet (5 mg total) by mouth daily., Disp: 90 tablet, Rfl: 2 .  aspirin 81 MG chewable tablet, Chew by mouth daily., Disp: , Rfl:  .  atorvastatin (LIPITOR) 20 MG tablet, Take 1 tablet (20 mg total) by mouth daily., Disp: 90 tablet, Rfl: 2 .  insulin glargine (LANTUS) 100 UNIT/ML injection, Inject 14 Units into the skin at bedtime., Disp: , Rfl:  .  levothyroxine (SYNTHROID, LEVOTHROID) 25 MCG tablet, Take 1 tablet (25 mcg total) by mouth daily before breakfast., Disp: 90 tablet, Rfl: 1 .  lisinopril (PRINIVIL,ZESTRIL) 40 MG tablet, TAKE 1 Tablet BY MOUTH ONCE DAILY FOR BLOOD PRESSURE, Disp: 90 tablet, Rfl: 1 .  sitaGLIPtin-metformin (JANUMET) 50-1000 MG tablet, Take 1 tablet by mouth 2 (two) times daily with a meal., Disp: 180 tablet, Rfl: 1 .  insulin aspart (NOVOLOG) 100 UNIT/ML injection, Inject into the skin 3 (three) times daily before meals. Sliding scalte- (scale 3, Disp: , Rfl:    Review of Systems  Constitutional: Positive for appetite change. Negative for chills,  diaphoresis, fatigue, fever and unexpected weight change.  HENT: Negative for congestion, dental problem, drooling, ear pain, facial swelling, hearing loss, mouth sores, sneezing, sore throat, trouble swallowing and voice change.   Eyes: Negative for pain, discharge, redness, itching and visual disturbance.  Respiratory: Negative for cough, choking, shortness of breath and wheezing.   Cardiovascular: Negative for chest pain, palpitations and leg swelling.  Gastrointestinal: Negative for abdominal pain, blood in stool, constipation, diarrhea and vomiting.  Endocrine: Negative for cold intolerance, heat intolerance and polydipsia.  Genitourinary: Negative for decreased urine volume, dysuria and hematuria.  Musculoskeletal: Negative for arthralgias, back pain and gait problem.  Skin: Negative for rash.  Allergic/Immunologic: Negative for environmental allergies.  Neurological: Negative for seizures, syncope, light-headedness and headaches.  Hematological: Negative for adenopathy.  Psychiatric/Behavioral: Negative for agitation, dysphoric mood and suicidal ideas. The patient is not nervous/anxious.     Per HPI unless specifically indicated above     Objective:    BP 130/66 (BP Location: Left Arm, Patient Position: Sitting, Cuff Size: Normal)   Pulse 93   Temp 98.1 F (36.7 C)   Ht 5\' 2"  (1.575 m)   Wt 158 lb (71.7 kg)   SpO2 98%   BMI 28.90 kg/m   Wt Readings from Last 3 Encounters:  05/31/16 158 lb (71.7 kg)  10/21/15  161 lb 11.2 oz (73.3 kg)  07/21/15 164 lb (74.4 kg)    Physical Exam  Constitutional: She is oriented to person, place, and time. She appears well-developed and well-nourished.  HENT:  Head: Normocephalic and atraumatic.  Neck: Neck supple.  Cardiovascular: Normal rate and regular rhythm.   Pulmonary/Chest: Effort normal and breath sounds normal.  Abdominal: Soft. Bowel sounds are normal. She exhibits no mass. There is no hepatosplenomegaly. There is no  tenderness.  Musculoskeletal: She exhibits no edema.  Lymphadenopathy:    She has no cervical adenopathy.  Neurological: She is alert and oriented to person, place, and time.  Skin: Skin is warm and dry.  Several red spots R ankle. Inflamed but no signs infection.  Psychiatric: She has a normal mood and affect. Her behavior is normal.  Vitals reviewed.     Results for orders placed or performed in visit on 05/28/16  Lipid Profile  Result Value Ref Range   Cholesterol 149 125 - 200 mg/dL   Triglycerides 86 <150 mg/dL   HDL 43 (L) >=46 mg/dL   Total CHOL/HDL Ratio 3.5 <=5.0 Ratio   VLDL 17 <30 mg/dL   LDL Cholesterol 89 <130 mg/dL  Comprehensive Metabolic Panel (CMET)  Result Value Ref Range   Sodium 139 135 - 146 mmol/L   Potassium 5.0 3.5 - 5.3 mmol/L   Chloride 108 98 - 110 mmol/L   CO2 21 20 - 31 mmol/L   Glucose, Bld 102 (H) 65 - 99 mg/dL   BUN 11 7 - 25 mg/dL   Creat 0.82 0.50 - 0.99 mg/dL   Total Bilirubin 0.4 0.2 - 1.2 mg/dL   Alkaline Phosphatase 81 33 - 130 U/L   AST 8 (L) 10 - 35 U/L   ALT 5 (L) 6 - 29 U/L   Total Protein 6.2 6.1 - 8.1 g/dL   Albumin 3.8 3.6 - 5.1 g/dL   Calcium 8.9 8.6 - 10.4 mg/dL  HgB A1c  Result Value Ref Range   Hgb A1c MFr Bld 7.1 (H) <5.7 %   Mean Plasma Glucose 157 mg/dL  Microalbumin, urine  Result Value Ref Range   Microalb, Ur 26.7 Not estab mg/dL  TSH  Result Value Ref Range   TSH 4.38 mIU/L      Assessment & Plan:   Encounter Diagnoses  Name Primary?  . Type 2 diabetes mellitus with complication, unspecified long term insulin use status ([place]) Yes  . Essential hypertension, benign   . Hypothyroidism, unspecified hypothyroidism type   . Hyperlipidemia   . Cigarette nicotine dependence, uncomplicated   . Screening for breast cancer      -order screening mammogram -refer for annual diabetic eye exam -continue current medications -counseled on care for apparent bug bites right ankle -counseled on smoking  cessation -f/u 3 months.  RTO sooner prn

## 2016-06-12 ENCOUNTER — Ambulatory Visit (HOSPITAL_COMMUNITY): Payer: Self-pay

## 2016-06-28 ENCOUNTER — Ambulatory Visit (HOSPITAL_COMMUNITY)
Admission: RE | Admit: 2016-06-28 | Discharge: 2016-06-28 | Disposition: A | Discharge status: Home / Self Care | Payer: Self-pay | Source: Ambulatory Visit | Attending: Physician Assistant | Admitting: Physician Assistant

## 2016-06-28 ENCOUNTER — Other Ambulatory Visit: Payer: Self-pay | Admitting: Physician Assistant

## 2016-06-28 DIAGNOSIS — Z1239 Encounter for other screening for malignant neoplasm of breast: Secondary | ICD-10-CM

## 2016-06-30 ENCOUNTER — Other Ambulatory Visit: Payer: Self-pay | Admitting: Physician Assistant

## 2016-06-30 DIAGNOSIS — R928 Other abnormal and inconclusive findings on diagnostic imaging of breast: Secondary | ICD-10-CM

## 2016-07-03 ENCOUNTER — Other Ambulatory Visit: Payer: Self-pay | Admitting: Physician Assistant

## 2016-07-03 MED ORDER — SITAGLIPTIN PHOS-METFORMIN HCL 50-1000 MG PO TABS: 1.0000 | ORAL_TABLET | Freq: Two times a day (BID) | ORAL | 1 refills | Status: DC

## 2016-07-19 ENCOUNTER — Other Ambulatory Visit: Payer: Self-pay | Admitting: Physician Assistant

## 2016-07-19 MED ORDER — LEVOTHYROXINE SODIUM 25 MCG PO TABS: 25.0000 ug | ORAL_TABLET | Freq: Every day | ORAL | 1 refills | Status: DC

## 2016-07-28 ENCOUNTER — Encounter (HOSPITAL_COMMUNITY): Payer: Self-pay | Admitting: Emergency Medicine

## 2016-07-28 ENCOUNTER — Observation Stay (HOSPITAL_COMMUNITY)
Admission: EM | Admit: 2016-07-28 | Discharge: 2016-07-29 | Disposition: A | Discharge status: Home / Self Care | Payer: Self-pay | Attending: Internal Medicine | Admitting: Internal Medicine

## 2016-07-28 ENCOUNTER — Emergency Department (HOSPITAL_COMMUNITY): Payer: Self-pay

## 2016-07-28 DIAGNOSIS — E039 Hypothyroidism, unspecified: Secondary | ICD-10-CM | POA: Insufficient documentation

## 2016-07-28 DIAGNOSIS — M791 Myalgia: Secondary | ICD-10-CM | POA: Insufficient documentation

## 2016-07-28 DIAGNOSIS — Z794 Long term (current) use of insulin: Secondary | ICD-10-CM | POA: Insufficient documentation

## 2016-07-28 DIAGNOSIS — Z79899 Other long term (current) drug therapy: Secondary | ICD-10-CM | POA: Insufficient documentation

## 2016-07-28 DIAGNOSIS — M7989 Other specified soft tissue disorders: Secondary | ICD-10-CM | POA: Insufficient documentation

## 2016-07-28 DIAGNOSIS — I10 Essential (primary) hypertension: Secondary | ICD-10-CM | POA: Diagnosis present

## 2016-07-28 DIAGNOSIS — E119 Type 2 diabetes mellitus without complications: Secondary | ICD-10-CM | POA: Insufficient documentation

## 2016-07-28 DIAGNOSIS — E118 Type 2 diabetes mellitus with unspecified complications: Secondary | ICD-10-CM | POA: Diagnosis present

## 2016-07-28 DIAGNOSIS — R109 Unspecified abdominal pain: Secondary | ICD-10-CM | POA: Insufficient documentation

## 2016-07-28 DIAGNOSIS — F17200 Nicotine dependence, unspecified, uncomplicated: Secondary | ICD-10-CM | POA: Insufficient documentation

## 2016-07-28 DIAGNOSIS — F17210 Nicotine dependence, cigarettes, uncomplicated: Secondary | ICD-10-CM | POA: Diagnosis present

## 2016-07-28 DIAGNOSIS — E2749 Other adrenocortical insufficiency: Secondary | ICD-10-CM | POA: Diagnosis present

## 2016-07-28 DIAGNOSIS — Z7982 Long term (current) use of aspirin: Secondary | ICD-10-CM | POA: Insufficient documentation

## 2016-07-28 DIAGNOSIS — Z23 Encounter for immunization: Secondary | ICD-10-CM | POA: Insufficient documentation

## 2016-07-28 DIAGNOSIS — R079 Chest pain, unspecified: Principal | ICD-10-CM | POA: Diagnosis present

## 2016-07-28 LAB — COMPREHENSIVE METABOLIC PANEL
ALK PHOS: 94 U/L (ref 38–126)
ALT: 8 U/L — ABNORMAL LOW (ref 14–54)
ANION GAP: 8 (ref 5–15)
AST: 11 U/L — ABNORMAL LOW (ref 15–41)
Albumin: 3.5 g/dL (ref 3.5–5.0)
BUN: 8 mg/dL (ref 6–20)
CALCIUM: 9.4 mg/dL (ref 8.9–10.3)
CO2: 22 mmol/L (ref 22–32)
Chloride: 106 mmol/L (ref 101–111)
Creatinine, Ser: 0.83 mg/dL (ref 0.44–1.00)
Glucose, Bld: 324 mg/dL — ABNORMAL HIGH (ref 65–99)
Potassium: 4.2 mmol/L (ref 3.5–5.1)
SODIUM: 136 mmol/L (ref 135–145)
TOTAL PROTEIN: 7.2 g/dL (ref 6.5–8.1)
Total Bilirubin: 0.3 mg/dL (ref 0.3–1.2)

## 2016-07-28 LAB — PROTIME-INR
INR: 0.94
PROTHROMBIN TIME: 12.6 s (ref 11.4–15.2)

## 2016-07-28 LAB — CBC WITH DIFFERENTIAL/PLATELET
BASOS ABS: 0 10*3/uL (ref 0.0–0.1)
BASOS PCT: 0 %
Eosinophils Absolute: 0.2 10*3/uL (ref 0.0–0.7)
Eosinophils Relative: 2 %
HEMATOCRIT: 39 % (ref 36.0–46.0)
Hemoglobin: 13.1 g/dL (ref 12.0–15.0)
LYMPHS PCT: 19 %
Lymphs Abs: 2 10*3/uL (ref 0.7–4.0)
MCH: 31.5 pg (ref 26.0–34.0)
MCHC: 33.6 g/dL (ref 30.0–36.0)
MCV: 93.8 fL (ref 78.0–100.0)
MONO ABS: 0.9 10*3/uL (ref 0.1–1.0)
Monocytes Relative: 9 %
NEUTROS ABS: 7.5 10*3/uL (ref 1.7–7.7)
Neutrophils Relative %: 70 %
Platelets: 261 10*3/uL (ref 150–400)
RBC: 4.16 MIL/uL (ref 3.87–5.11)
RDW: 13.2 % (ref 11.5–15.5)
WBC: 10.5 10*3/uL (ref 4.0–10.5)

## 2016-07-28 LAB — URINE MICROSCOPIC-ADD ON

## 2016-07-28 LAB — URINALYSIS, ROUTINE W REFLEX MICROSCOPIC
BILIRUBIN URINE: NEGATIVE
Glucose, UA: >1000 mg/dL — AB
KETONES UR: NEGATIVE mg/dL
Leukocytes, UA: NEGATIVE
Nitrite: NEGATIVE
PROTEIN: 100 mg/dL — AB
Specific Gravity, Urine: 1.01 (ref 1.005–1.030)
pH: 6 (ref 5.0–8.0)

## 2016-07-28 LAB — TROPONIN I

## 2016-07-28 LAB — GLUCOSE, CAPILLARY: GLUCOSE-CAPILLARY: 311 mg/dL — AB (ref 65–99)

## 2016-07-28 MED ORDER — INSULIN ASPART 100 UNIT/ML ~~LOC~~ SOLN
0.0000 [IU] | Freq: Three times a day (TID) | SUBCUTANEOUS | Status: DC
  Administered 2016-07-29 (×2): 3 [IU] via SUBCUTANEOUS

## 2016-07-28 MED ORDER — SITAGLIPTIN PHOS-METFORMIN HCL 50-1000 MG PO TABS: 1.0000 | ORAL_TABLET | Freq: Two times a day (BID) | ORAL | Status: DC

## 2016-07-28 MED ORDER — LINAGLIPTIN 5 MG PO TABS
5.0000 mg | ORAL_TABLET | Freq: Every day | ORAL | Status: DC
  Administered 2016-07-29: 5 mg via ORAL
  Filled 2016-07-28: qty 1

## 2016-07-28 MED ORDER — ACETAMINOPHEN 325 MG PO TABS
650.0000 mg | ORAL_TABLET | ORAL | Status: DC | PRN
  Administered 2016-07-28 – 2016-07-29 (×2): 650 mg via ORAL
  Filled 2016-07-28 (×2): qty 2

## 2016-07-28 MED ORDER — GI COCKTAIL ~~LOC~~: 30.0000 mL | Freq: Four times a day (QID) | ORAL | Status: DC | PRN

## 2016-07-28 MED ORDER — INSULIN ASPART 100 UNIT/ML ~~LOC~~ SOLN
0.0000 [IU] | Freq: Every day | SUBCUTANEOUS | Status: DC
  Administered 2016-07-28: 4 [IU] via SUBCUTANEOUS

## 2016-07-28 MED ORDER — LEVOTHYROXINE SODIUM 25 MCG PO TABS
25.0000 ug | ORAL_TABLET | Freq: Every day | ORAL | Status: DC
  Administered 2016-07-29: 25 ug via ORAL
  Filled 2016-07-28: qty 1

## 2016-07-28 MED ORDER — AMLODIPINE BESYLATE 5 MG PO TABS
5.0000 mg | ORAL_TABLET | Freq: Every day | ORAL | Status: DC
  Administered 2016-07-29: 5 mg via ORAL
  Filled 2016-07-28: qty 1

## 2016-07-28 MED ORDER — HYDROMORPHONE HCL 1 MG/ML IJ SOLN
1.0000 mg | Freq: Once | INTRAMUSCULAR | Status: AC
  Administered 2016-07-28: 1 mg via INTRAVENOUS
  Filled 2016-07-28: qty 1

## 2016-07-28 MED ORDER — ATORVASTATIN CALCIUM 20 MG PO TABS
20.0000 mg | ORAL_TABLET | Freq: Every evening | ORAL | Status: DC
  Administered 2016-07-28: 20 mg via ORAL
  Filled 2016-07-28: qty 1

## 2016-07-28 MED ORDER — NICOTINE 7 MG/24HR TD PT24
7.0000 mg | MEDICATED_PATCH | Freq: Every day | TRANSDERMAL | Status: DC
  Administered 2016-07-28: 7 mg via TRANSDERMAL
  Filled 2016-07-28 (×2): qty 1

## 2016-07-28 MED ORDER — MORPHINE SULFATE (PF) 2 MG/ML IV SOLN
2.0000 mg | INTRAVENOUS | Status: DC | PRN
  Filled 2016-07-28: qty 1

## 2016-07-28 MED ORDER — LISINOPRIL 10 MG PO TABS
40.0000 mg | ORAL_TABLET | Freq: Every day | ORAL | Status: DC
  Administered 2016-07-29: 40 mg via ORAL
  Filled 2016-07-28: qty 4

## 2016-07-28 MED ORDER — INSULIN GLARGINE 100 UNIT/ML ~~LOC~~ SOLN
14.0000 [IU] | Freq: Every day | SUBCUTANEOUS | Status: DC
  Administered 2016-07-28: 14 [IU] via SUBCUTANEOUS
  Filled 2016-07-28 (×3): qty 0.14

## 2016-07-28 MED ORDER — SODIUM CHLORIDE 0.9 % IV BOLUS (SEPSIS)
1000.0000 mL | Freq: Once | INTRAVENOUS | Status: AC
  Administered 2016-07-28: 1000 mL via INTRAVENOUS

## 2016-07-28 MED ORDER — METFORMIN HCL 500 MG PO TABS
1000.0000 mg | ORAL_TABLET | Freq: Two times a day (BID) | ORAL | Status: DC
  Administered 2016-07-29: 1000 mg via ORAL
  Filled 2016-07-28: qty 2

## 2016-07-28 MED ORDER — INFLUENZA VAC SPLIT QUAD 0.5 ML IM SUSY
0.5000 mL | PREFILLED_SYRINGE | INTRAMUSCULAR | Status: AC
  Administered 2016-07-29: 0.5 mL via INTRAMUSCULAR
  Filled 2016-07-28: qty 0.5

## 2016-07-28 MED ORDER — ASPIRIN 81 MG PO CHEW
81.0000 mg | CHEWABLE_TABLET | Freq: Every day | ORAL | Status: DC
  Administered 2016-07-29: 81 mg via ORAL
  Filled 2016-07-28: qty 1

## 2016-07-28 MED ORDER — IOPAMIDOL (ISOVUE-370) INJECTION 76%
100.0000 mL | Freq: Once | INTRAVENOUS | Status: AC | PRN
  Administered 2016-07-28: 100 mL via INTRAVENOUS

## 2016-07-28 MED ORDER — ONDANSETRON HCL 4 MG/2ML IJ SOLN: 4.0000 mg | Freq: Four times a day (QID) | INTRAMUSCULAR | Status: DC | PRN

## 2016-07-28 NOTE — ED Notes (Signed)
Attempted report x1. 

## 2016-07-28 NOTE — ED Provider Notes (Signed)
[facility] DEPT Provider Note   CSN: [id number] Arrival date & time: 07/28/16  1121     History   Chief Complaint Chief Complaint  Patient presents with  . L sided pain    HPI Lisa Jenkins is a 65 y.o. female.  HPI  65 year old female with a history of diabetes, hypertension, and high cholesterol presents with left-sided pain. She states this started yesterday morning when she woke up. She has a hard time describing exactly how the pain feels but mostly describes it as an aching sensation. It is constant, but then she'll describe periods where it seems to fluctuate and get more severe. Primarily points to her shoulder as well as her anterior chest. Coughing or sneezing seem to make the pain worse. She has left leg swelling but also states that she has had left leg swelling on and off for over 10 years. There is no shortness of breath. There is pain in her left back as well. All of this seems to come at the same time. Denies any weakness or numbness, her primary concern is for a possible stroke. There is no headache, nausea, or vomiting. Took Tylenol for pain without any relief. No fevers or urinary symptoms. Has neck pain as well. Any type of movement seems to make pain worse. Laying on left side significantly worsens pain.  Past Medical History:  Diagnosis Date  . Diabetes mellitus without complication ([place])   . High cholesterol   . Hypertension     Patient Active Problem List   Diagnosis Date Noted  . Chest pain 07/28/2016  . 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 Hypothyroidism 10/21/2015  . Uncontrolled type 2 diabetes mellitus with complication ([place]) [id number]  . Type 2 diabetes mellitus with complication ([place]) [id number]  . Hyperlipidemia 07/21/2015  . Essential hypertension, benign 07/21/2015  . Cigarette nicotine dependence, uncomplicated [id number]  . Type 2 diabetes mellitus with retinopathy ([place]) 07/21/2015  . Proteinuria 07/21/2015  . Thyroid activity  decreased 07/21/2015    Past Surgical History:  Procedure Laterality Date  . BUNIONECTOMY Bilateral     OB History    Gravida Para Term Preterm AB Living             0   SAB TAB Ectopic Multiple Live Births                   Home Medications    Prior to Admission medications   Medication Sig Start Date End Date Taking? Authorizing Provider  amLODipine (NORVASC) 5 MG tablet Take 1 tablet (5 mg total) by mouth daily. 11/08/15  Yes Soyla Dryer, PA-C  aspirin 81 MG chewable tablet Chew 81 mg by mouth daily.    Yes Historical Provider, MD  atorvastatin (LIPITOR) 20 MG tablet Take 1 tablet (20 mg total) by mouth daily. Patient taking differently: Take 20 mg by mouth every evening.  11/08/15  Yes Soyla Dryer, PA-C  insulin glargine (LANTUS) 100 UNIT/ML injection Inject 14 Units into the skin at bedtime.   Yes Historical Provider, MD  levothyroxine (SYNTHROID, LEVOTHROID) 25 MCG tablet Take 1 tablet (25 mcg total) by mouth daily before breakfast. 07/19/16  Yes Soyla Dryer, PA-C  lisinopril (PRINIVIL,ZESTRIL) 40 MG tablet TAKE 1 Tablet BY MOUTH ONCE DAILY FOR BLOOD PRESSURE 10/17/15  Yes Soyla Dryer, PA-C  sitaGLIPtin-metformin (JANUMET) 50-1000 MG tablet Take 1 tablet by mouth 2 (two) times daily with a meal. 07/03/16  Yes Soyla Dryer, PA-C    Family History Family History  Problem Relation Age of Onset  . Diabetes Mother   . Heart disease Mother   . Heart disease Father   . Diabetes Sister   . Heart disease Sister   . Diabetes Brother   . Heart disease Brother   . Diabetes Brother   . Heart disease Brother   . Diabetes Brother   . Heart disease Brother   . Diabetes Brother   . Heart disease Brother   . Diabetes Brother   . Heart disease Brother     Social History Social History  Substance Use Topics  . Smoking status: Current Every Day Smoker    Packs/day: 2.00    Years: 46.00  . Smokeless tobacco: Never Used  . Alcohol use No     Allergies     Patient has no known allergies.   Review of Systems Review of Systems  Constitutional: Negative for fever.  Respiratory: Negative for shortness of breath.   Cardiovascular: Positive for chest pain and leg swelling.  Gastrointestinal: Negative for abdominal pain, diarrhea, nausea and vomiting.  Genitourinary: Negative for dysuria. Menstrual problem: left.  Musculoskeletal: Positive for myalgias.  Neurological: Negative for weakness, numbness and headaches.  All other systems reviewed and are negative.    Physical Exam Updated Vital Signs BP (!) 146/68 (BP Location: Left Arm)   Pulse (!) 115   Temp 99 F (37.2 C) (Oral)   Resp 20   Ht 5\' 2"  (1.575 m)   Wt 155 lb 4.8 oz (70.4 kg)   SpO2 93%   BMI 28.40 kg/m   Physical Exam  Constitutional: She is oriented to person, place, and time. She appears well-developed and well-nourished. No distress.  HENT:  Head: Normocephalic and atraumatic.  Right Ear: External ear normal.  Left Ear: External ear normal.  Nose: Nose normal.  Eyes: Right eye exhibits no discharge. Left eye exhibits no discharge.  Neck: Normal range of motion. Neck supple.  She reports some tenderness at base of neck but when I try to reproduce it later I can't find a tender area  Cardiovascular: Normal rate, regular rhythm and normal heart sounds.   Pulses:      Radial pulses are 2+ on the right side, and 2+ on the left side.       Dorsalis pedis pulses are 2+ on the right side, and 2+ on the left side.  Pulmonary/Chest: Effort normal and breath sounds normal. She exhibits no tenderness.  Abdominal: Soft. She exhibits no distension. There is no tenderness.  Musculoskeletal:  No back tenderness or swelling Left lower leg might be slightly swollen compared to right but essentially seems similar size  Neurological: She is alert and oriented to person, place, and time.  5/5 strength in all 4 extremities. Grossly normal sensation  Skin: Skin is warm and dry. She  is not diaphoretic.  Nursing note and vitals reviewed.    ED Treatments / Results  Labs (all labs ordered are listed, but only abnormal results are displayed) Labs Reviewed  COMPREHENSIVE METABOLIC PANEL - Abnormal; Notable for the following:       Result Value   Glucose, Bld 324 (*)    AST 11 (*)    ALT 8 (*)    All other components within normal limits  URINALYSIS, ROUTINE W REFLEX MICROSCOPIC (NOT AT [facility]) - Abnormal; Notable for the following:    Glucose, UA >1000 (*)    Hgb urine dipstick TRACE (*)    Protein, ur 100 (*)  All other components within normal limits  URINE MICROSCOPIC-ADD ON - Abnormal; Notable for the following:    Squamous Epithelial / LPF 6-30 (*)    Bacteria, UA FEW (*)    All other components within normal limits  GLUCOSE, CAPILLARY - Abnormal; Notable for the following:    Glucose-Capillary 311 (*)    All other components within normal limits  CBC WITH DIFFERENTIAL/PLATELET  TROPONIN I  TROPONIN I  TROPONIN I  PROTIME-INR  TROPONIN I  CBC  CORTISOL-AM, BLOOD  BASIC METABOLIC PANEL    EKG  EKG Interpretation  Date/Time:  Friday July 28 2016 11:32:59 EST Ventricular Rate:  78 PR Interval:  118 QRS Duration: 92 QT Interval:  356 QTC Calculation: 405 R Axis:   -8 Text Interpretation:  Normal sinus rhythm Nonspecific T wave abnormality Abnormal ECG No old tracing to compare Confirmed by Texie Tupou MD, Shauntell Iglesia [phone]) on 07/28/2016 3:15:47 PM       EKG Interpretation  Date/Time:  Friday July 28 2016 16:01:11 EST Ventricular Rate:  103 PR Interval:  118 QRS Duration: 92 QT Interval:  344 QTC Calculation: 451 R Axis:   28 Text Interpretation:  Sinus tachycardia Borderline abnrm T, anterolateral leads Baseline wander in lead(s) V6 rate is a little faster, otherwise nonspecific T waves are similar to earlier in the day Confirmed by Lelah Rennaker MD, Rejeana Fadness [phone]) on 07/28/2016 4:13:07 PM        Radiology Ct Angio Chest/abd/pel For  Dissection W And/or Wo Contrast  Result Date: 07/28/2016 CLINICAL DATA:  Severe left-sided chest pain and back pain for 2 days. Also left abdominal and left back pain. EXAM: CT ANGIOGRAPHY CHEST, ABDOMEN AND PELVIS TECHNIQUE: Multidetector CT imaging through the chest, abdomen and pelvis was performed using the standard protocol during bolus administration of intravenous contrast. Multiplanar reconstructed images and MIPs were obtained and reviewed to evaluate the vascular anatomy. CONTRAST:  100 cc Isovue 370 intravenously. COMPARISON:  Chest radiograph 08/26/2014 FINDINGS: CTA CHEST FINDINGS Cardiovascular: Preferential opacification of the thoracic aorta. No evidence of thoracic aortic aneurysm or dissection. Moderate calcific atherosclerotic disease of the aorta with calcified and noncalcified plaque throughout. Normal heart size. No pericardial effusion. Advanced calcific atherosclerotic disease of the coronary arteries. Mediastinum/Nodes: No enlarged mediastinal, hilar, or axillary lymph nodes. Thyroid gland, trachea, and esophagus demonstrate no significant findings. Lungs/Pleura: Lungs are clear. No pleural effusion or pneumothorax. Musculoskeletal: No chest wall abnormality. No acute or significant osseous findings. Review of the MIP images confirms the above findings. CTA ABDOMEN AND PELVIS FINDINGS VASCULAR Aorta: Bovine arch configuration. Calcified and noncalcified atherosclerotic disease of the aorta and mild tortuosity. Celiac: Patent without evidence of aneurysm, dissection, vasculitis or significant stenosis. SMA: Annular atherosclerotic calcifications at its origin without flow limiting stenosis. Renals: Right greater than left calcific atherosclerotic disease at the origins with mild approximate 30% stenosis on the right and no significant stenosis on the left. Accessory left renal artery supplying the inferior pole of the kidney. IMA: Calcified and noncalcified plaque at its origin causing  significant lumen narrowing. The distal vessel is normally opacified. Inflow: Patent without evidence of aneurysm, dissection, vasculitis or significant stenosis. Veins: No obvious venous abnormality within the limitations of this arterial phase study. Review of the MIP images confirms the above findings. NON-VASCULAR Hepatobiliary: No focal liver abnormality is seen. No gallstones, gallbladder wall thickening, or biliary dilatation. Pancreas: Unremarkable. No pancreatic ductal dilatation or surrounding inflammatory changes. Spleen: Normal in size without focal abnormality. Adrenals/Urinary Tract: Enlargement of the left  adrenal gland which measures 4.4 x 3.3 by 5.4 cm. Normal cortical medullary differentiation of bilateral kidneys. Numerous linear and punctate calcifications within the kidneys are felt to be vascular. No evidence of hydronephrosis. Left greater than right perinephric stranding. Stomach/Bowel: Stomach is within normal limits. Appendix appears normal. No evidence of bowel wall thickening, distention, or inflammatory changes. Scattered diverticular without evidence of diverticulitis. Lymphatic: Aortic atherosclerosis. No enlarged abdominal or pelvic lymph nodes. Reproductive: Uterus and bilateral adnexa are unremarkable. Other: No abdominal wall hernia or abnormality. No abdominopelvic ascites. Musculoskeletal: No acute or significant osseous findings. Review of the MIP images confirms the above findings. IMPRESSION: Left adrenal gland enlargement. The precontrast attenuation suggests adrenal adenoma. In the settings of acute symptomatology hemorrhage within a large adrenal adenoma may be considered. Advanced calcific atherosclerotic disease of the coronary arteries. Calcified and noncalcified atherosclerotic disease of the aorta without evidence of dissection or aneurysmal dilation. Calcified and noncalcified plaque at the origins of all the main aortic attributes. These results were called by  telephone at the time of interpretation on 07/28/2016 at 5:18 pm to Dr. Sherwood Gambler , who verbally acknowledged these results. Electronically Signed   By: Fidela Salisbury M.D.   On: 07/28/2016 17:18    Procedures Procedures (including critical care time)  Medications Ordered in ED Medications  aspirin chewable tablet 81 mg (not administered)  insulin glargine (LANTUS) injection 14 Units (14 Units Subcutaneous Given 07/28/16 2353)  lisinopril (PRINIVIL,ZESTRIL) tablet 40 mg (not administered)  atorvastatin (LIPITOR) tablet 20 mg (20 mg Oral Given 07/28/16 2352)  amLODipine (NORVASC) tablet 5 mg (not administered)  levothyroxine (SYNTHROID, LEVOTHROID) tablet 25 mcg (not administered)  acetaminophen (TYLENOL) tablet 650 mg (650 mg Oral Given 07/28/16 2352)  ondansetron (ZOFRAN) injection 4 mg (not administered)  morphine 2 MG/ML injection 2 mg (not administered)  gi cocktail (Maalox,Lidocaine,Donnatal) (not administered)  insulin aspart (novoLOG) injection 0-5 Units (not administered)  insulin aspart (novoLOG) injection 0-15 Units (not administered)  nicotine (NICODERM CQ - dosed in mg/24 hr) patch 7 mg (7 mg Transdermal Patch Applied 07/28/16 2353)  Influenza vac split quadrivalent PF (FLUARIX) injection 0.5 mL (not administered)  linagliptin (TRADJENTA) tablet 5 mg (not administered)    And  metFORMIN (GLUCOPHAGE) tablet 1,000 mg (not administered)  sodium chloride 0.9 % bolus 1,000 mL (0 mLs Intravenous Stopped 07/28/16 1704)  HYDROmorphone (DILAUDID) injection 1 mg (1 mg Intravenous Given 07/28/16 1556)  iopamidol (ISOVUE-370) 76 % injection 100 mL (100 mLs Intravenous Contrast Given 07/28/16 1616)     Initial Impression / Assessment and Plan / ED Course  I have reviewed the triage vital signs and the nursing notes.  Pertinent labs & imaging results that were available during my care of the patient were reviewed by me and considered in my medical decision making (see chart  for details).  Clinical Course as of Jul 29 4  Fri Jul 28, 2016  1541 Pain seems more extremity/back pain but there is chest pain present as well. Given the chest/back pain and it's severity, will get CTA to r/o dissection. While she has reported left calf swelling, this is a chronic issue so my suspicion for PE is lower. Will get 2nd troponin. My suspicion for ACS is much lower, despite her risk factors. If 2nd trop negative and repeat ECG stable, will let her f/u with PCP for cardiac workup.  [SG]  21 Dr. Nehemiah Settle to admit  [SG]    Clinical Course User Index [SG] Sherwood Gambler, MD  I'm not sure if the possible adrenal hemorrhage is real or even symptomatic. However as this can be quite a serious issue, she will be admitted to the hospital for monitoring and cortisol level in the a.m. I think her chest pain is probably not ACS, continue troponins.  Final Clinical Impressions(s) / ED Diagnoses   Final diagnoses:  Nonspecific chest pain    New Prescriptions Current Discharge Medication List       Sherwood Gambler, MD 07/29/16 0005

## 2016-07-28 NOTE — H&P (Addendum)
History and Physical  Lisa Jenkins [id number] DOB: [birth year] DOA: 07/28/2016  Referring physician: Dr. Regenia Skeeter, ED physician PCP: Soyla Dryer, PA-C  Outpatient Specialists:   Chief Complaint: Left-sided chest pain  HPI: Lisa Jenkins is a 65 y.o. female with a history of diabetes type 2, hyper-cholesterolemia, hypertension, hypothyroidism. Patient presents with onset of chest pain last night has been progressing. Chest pain radiates into her left arm and back and down left side. It's been worsening. Pain worse with lying on left side. She presented because the pain wasn't improving.  Emergency Department Course: Patient had 2 troponins 3 hours apart that were negative. She had a CTA of her chest, which showed no aortic dissection. However, the patient's CTA showed an incidental left adrenal adenoma or hemorrhage.   Review of Systems:   Pt denies any fevers, chills, nausea, vomiting, diarrhea, constipation, abdominal pain, shortness of breath, dyspnea on exertion, orthopnea, cough, wheezing, palpitations, headache, vision changes, lightheadedness, dizziness, melena, rectal bleeding.  Review of systems are otherwise negative  Past Medical History:  Diagnosis Date  . Diabetes mellitus without complication ([place])   . High cholesterol   . Hypertension    Past Surgical History:  Procedure Laterality Date  . BUNIONECTOMY Bilateral    Social History:  reports that she has been smoking.  She has a 23.00 pack-year smoking history. She has never used smokeless tobacco. She reports that she does not drink alcohol or use drugs. Patient lives at Home  No Known Allergies  Family History  Problem Relation Age of Onset  . Diabetes Mother   . Heart disease Mother   . Heart disease Father   . Diabetes Sister   . Heart disease Sister   . Diabetes Brother   . Heart disease Brother   . Diabetes Brother   . Heart disease Brother   . Diabetes Brother   . Heart disease Brother   .  Diabetes Brother   . Heart disease Brother   . Diabetes Brother   . Heart disease Brother       Prior to Admission medications   Medication Sig Start Date End Date Taking? Authorizing Provider  amLODipine (NORVASC) 5 MG tablet Take 1 tablet (5 mg total) by mouth daily. 11/08/15  Yes Soyla Dryer, PA-C  aspirin 81 MG chewable tablet Chew 81 mg by mouth daily.    Yes Historical Provider, MD  atorvastatin (LIPITOR) 20 MG tablet Take 1 tablet (20 mg total) by mouth daily. Patient taking differently: Take 20 mg by mouth every evening.  11/08/15  Yes Soyla Dryer, PA-C  insulin glargine (LANTUS) 100 UNIT/ML injection Inject 14 Units into the skin at bedtime.   Yes Historical Provider, MD  levothyroxine (SYNTHROID, LEVOTHROID) 25 MCG tablet Take 1 tablet (25 mcg total) by mouth daily before breakfast. 07/19/16  Yes Soyla Dryer, PA-C  lisinopril (PRINIVIL,ZESTRIL) 40 MG tablet TAKE 1 Tablet BY MOUTH ONCE DAILY FOR BLOOD PRESSURE 10/17/15  Yes Soyla Dryer, PA-C  sitaGLIPtin-metformin (JANUMET) 50-1000 MG tablet Take 1 tablet by mouth 2 (two) times daily with a meal. 07/03/16  Yes Soyla Dryer, PA-C    Physical Exam: BP 120/77   Pulse 86   Temp 98.5 F (36.9 C) (Oral)   Resp 20   Ht 5\' 2"  (1.575 m)   Wt 68.9 kg (152 lb)   SpO2 95%   BMI 27.80 kg/m   General: Older Caucasian female. Awake and alert and oriented x3. No acute cardiopulmonary distress.  HEENT: Normocephalic atraumatic.  Right and left ears normal in appearance.  Pupils equal, round, reactive to light. Extraocular muscles are intact. Sclerae anicteric and noninjected.  Moist mucosal membranes. No mucosal lesions.  Neck: Neck supple without lymphadenopathy. No carotid bruits. No masses palpated.  Cardiovascular: Regular rate with normal S1-S2 sounds. No murmurs, rubs, gallops auscultated. No JVD.  Respiratory: Good respiratory effort with no wheezes, rales, rhonchi. Lungs clear to auscultation bilaterally.  No accessory  muscle use. Abdomen: Soft, nontender, nondistended. Active bowel sounds. No masses or hepatosplenomegaly  Skin: No rashes, lesions, or ulcerations.  Dry, warm to touch. 2+ dorsalis pedis and radial pulses. Musculoskeletal: Tenderness to chest and upper back to palpation. No calf or leg pain. All major joints not erythematous nontender.  No upper or lower joint deformation.  Good ROM.  No contractures  Psychiatric: Intact judgment and insight. Pleasant and cooperative. Neurologic: No focal neurological deficits. Strength is 5/5 and symmetric in upper and lower extremities.  Cranial nerves II through XII are grossly intact.           Labs on Admission: I have personally reviewed following labs and imaging studies  CBC:  Recent Labs Lab 07/28/16 1224  WBC 10.5  NEUTROABS 7.5  HGB 13.1  HCT 39.0  MCV 93.8  PLT 0000000   Basic Metabolic Panel:  Recent Labs Lab 07/28/16 1224  NA 136  K 4.2  CL 106  CO2 22  GLUCOSE 324*  BUN 8  CREATININE 0.83  CALCIUM 9.4   GFR: Estimated Creatinine Clearance: 63.1 mL/min (by C-G formula based on SCr of 0.83 mg/dL). Liver Function Tests:  Recent Labs Lab 07/28/16 1224  AST 11*  ALT 8*  ALKPHOS 94  BILITOT 0.3  PROT 7.2  ALBUMIN 3.5   No results for input(s): LIPASE, AMYLASE in the last 168 hours. No results for input(s): AMMONIA in the last 168 hours. Coagulation Profile: No results for input(s): INR, PROTIME in the last 168 hours. Cardiac Enzymes:  Recent Labs Lab 07/28/16 1224 07/28/16 1546  TROPONINI <0.03 <0.03   BNP (last 3 results) No results for input(s): PROBNP in the last 8760 hours. HbA1C: No results for input(s): HGBA1C in the last 72 hours. CBG: No results for input(s): GLUCAP in the last 168 hours. Lipid Profile: No results for input(s): CHOL, HDL, LDLCALC, TRIG, CHOLHDL, LDLDIRECT in the last 72 hours. Thyroid Function Tests: No results for input(s): TSH, T4TOTAL, FREET4, T3FREE, THYROIDAB in the last 72  hours. Anemia Panel: No results for input(s): VITAMINB12, FOLATE, FERRITIN, TIBC, IRON, RETICCTPCT in the last 72 hours. Urine analysis: No results found for: COLORURINE, APPEARANCEUR, LABSPEC, PHURINE, GLUCOSEU, HGBUR, BILIRUBINUR, KETONESUR, PROTEINUR, UROBILINOGEN, NITRITE, LEUKOCYTESUR Sepsis Labs: @LABRCNTIP (procalcitonin:4,lacticidven:4) )No results found for this or any previous visit (from the past 240 hour(s)).   Radiological Exams on Admission: Ct Angio Chest/abd/pel For Dissection W And/or Wo Contrast  Result Date: 07/28/2016 CLINICAL DATA:  Severe left-sided chest pain and back pain for 2 days. Also left abdominal and left back pain. EXAM: CT ANGIOGRAPHY CHEST, ABDOMEN AND PELVIS TECHNIQUE: Multidetector CT imaging through the chest, abdomen and pelvis was performed using the standard protocol during bolus administration of intravenous contrast. Multiplanar reconstructed images and MIPs were obtained and reviewed to evaluate the vascular anatomy. CONTRAST:  100 cc Isovue 370 intravenously. COMPARISON:  Chest radiograph 08/26/2014 FINDINGS: CTA CHEST FINDINGS Cardiovascular: Preferential opacification of the thoracic aorta. No evidence of thoracic aortic aneurysm or dissection. Moderate calcific atherosclerotic disease of the aorta with calcified and noncalcified plaque throughout.  Normal heart size. No pericardial effusion. Advanced calcific atherosclerotic disease of the coronary arteries. Mediastinum/Nodes: No enlarged mediastinal, hilar, or axillary lymph nodes. Thyroid gland, trachea, and esophagus demonstrate no significant findings. Lungs/Pleura: Lungs are clear. No pleural effusion or pneumothorax. Musculoskeletal: No chest wall abnormality. No acute or significant osseous findings. Review of the MIP images confirms the above findings. CTA ABDOMEN AND PELVIS FINDINGS VASCULAR Aorta: Bovine arch configuration. Calcified and noncalcified atherosclerotic disease of the aorta and mild  tortuosity. Celiac: Patent without evidence of aneurysm, dissection, vasculitis or significant stenosis. SMA: Annular atherosclerotic calcifications at its origin without flow limiting stenosis. Renals: Right greater than left calcific atherosclerotic disease at the origins with mild approximate 30% stenosis on the right and no significant stenosis on the left. Accessory left renal artery supplying the inferior pole of the kidney. IMA: Calcified and noncalcified plaque at its origin causing significant lumen narrowing. The distal vessel is normally opacified. Inflow: Patent without evidence of aneurysm, dissection, vasculitis or significant stenosis. Veins: No obvious venous abnormality within the limitations of this arterial phase study. Review of the MIP images confirms the above findings. NON-VASCULAR Hepatobiliary: No focal liver abnormality is seen. No gallstones, gallbladder wall thickening, or biliary dilatation. Pancreas: Unremarkable. No pancreatic ductal dilatation or surrounding inflammatory changes. Spleen: Normal in size without focal abnormality. Adrenals/Urinary Tract: Enlargement of the left adrenal gland which measures 4.4 x 3.3 by 5.4 cm. Normal cortical medullary differentiation of bilateral kidneys. Numerous linear and punctate calcifications within the kidneys are felt to be vascular. No evidence of hydronephrosis. Left greater than right perinephric stranding. Stomach/Bowel: Stomach is within normal limits. Appendix appears normal. No evidence of bowel wall thickening, distention, or inflammatory changes. Scattered diverticular without evidence of diverticulitis. Lymphatic: Aortic atherosclerosis. No enlarged abdominal or pelvic lymph nodes. Reproductive: Uterus and bilateral adnexa are unremarkable. Other: No abdominal wall hernia or abnormality. No abdominopelvic ascites. Musculoskeletal: No acute or significant osseous findings. Review of the MIP images confirms the above findings.  IMPRESSION: Left adrenal gland enlargement. The precontrast attenuation suggests adrenal adenoma. In the settings of acute symptomatology hemorrhage within a large adrenal adenoma may be considered. Advanced calcific atherosclerotic disease of the coronary arteries. Calcified and noncalcified atherosclerotic disease of the aorta without evidence of dissection or aneurysmal dilation. Calcified and noncalcified plaque at the origins of all the main aortic attributes. These results were called by telephone at the time of interpretation on 07/28/2016 at 5:18 pm to Dr. Sherwood Gambler , who verbally acknowledged these results. Electronically Signed   By: Fidela Salisbury M.D.   On: 07/28/2016 17:18    EKG: Independently reviewed. Sinus tachycardia. Flat T waves in V1, V2, V5 and V6. Acute ST elevation or depression.  Assessment/Plan: Principal Problem:   Chest pain Active Problems:   Essential hypertension, benign   Cigarette nicotine dependence, uncomplicated   Type 2 diabetes mellitus with complication ([place])   Adrenal hemorrhage ([place])    This patient was discussed with the ED physician, including pertinent vitals, physical exam findings, labs, and imaging.  We also discussed care given by the ED provider.  #1 chest pain  Observe on telemetry  I do not think that this represents ACS given the patient's negative troponins and pain reducible on exam  Will rule out with 2 more troponins #2 adrenal adenoma versus adrenal hemorrhage  The concern with adrenal hemorrhage would be adrenal infarct.  Repeat CBC and BMP in the morning to assure stability of H/H and electrolytes  Cortisol level in  the morning  MRI of abdomen with and without contrast in the morning #3 type 2 diabetes  Into a home regimen  Slight scale insulin with CBGs before meals and daily at bedtime #4 nicotine dependence  Negative patch #5 essential hypertension  Continue home medications  DVT prophylaxis: SCDs  given the possibility of adrenal hemorrhage Consultants: none Code Status: Full Family Communication: daughter in room  Disposition Plan: home following observation   Truett Mainland, DO [facility] Pager [phone]  If 7PM-7AM, please contact night-coverage [email or web address] Password [email or web address]

## 2016-07-28 NOTE — ED Triage Notes (Signed)
Pt reports L sided pain that started 2 days ago. Pt states she woke with the discomfort. Pt states the pain is in her arm, side, across her chest and down her L leg. EKG in Triage. VSS.

## 2016-07-29 ENCOUNTER — Observation Stay (HOSPITAL_COMMUNITY): Payer: Self-pay

## 2016-07-29 LAB — BASIC METABOLIC PANEL
Anion gap: 7 (ref 5–15)
BUN: 6 mg/dL (ref 6–20)
CALCIUM: 9 mg/dL (ref 8.9–10.3)
CO2: 24 mmol/L (ref 22–32)
CREATININE: 0.56 mg/dL (ref 0.44–1.00)
Chloride: 106 mmol/L (ref 101–111)
GFR calc Af Amer: >60 mL/min (ref >60)
GFR calc non Af Amer: >60 mL/min (ref >60)
GLUCOSE: 163 mg/dL — AB (ref 65–99)
Potassium: 4.1 mmol/L (ref 3.5–5.1)
Sodium: 137 mmol/L (ref 135–145)

## 2016-07-29 LAB — GLUCOSE, CAPILLARY
Glucose-Capillary: 159 mg/dL — ABNORMAL HIGH (ref 65–99)
Glucose-Capillary: 191 mg/dL — ABNORMAL HIGH (ref 65–99)

## 2016-07-29 LAB — CBC
HCT: 36.7 % (ref 36.0–46.0)
HEMOGLOBIN: 12.1 g/dL (ref 12.0–15.0)
MCH: 30.9 pg (ref 26.0–34.0)
MCHC: 33 g/dL (ref 30.0–36.0)
MCV: 93.6 fL (ref 78.0–100.0)
Platelets: 281 10*3/uL (ref 150–400)
RBC: 3.92 MIL/uL (ref 3.87–5.11)
RDW: 13.3 % (ref 11.5–15.5)
WBC: 9.9 10*3/uL (ref 4.0–10.5)

## 2016-07-29 LAB — CORTISOL-AM, BLOOD: CORTISOL - AM: 12.4 ug/dL (ref 6.7–22.6)

## 2016-07-29 MED ORDER — SODIUM CHLORIDE 0.9 % IV SOLN
INTRAVENOUS | Status: AC
  Filled 2016-07-29: qty 250

## 2016-07-29 MED ORDER — GADOBENATE DIMEGLUMINE 529 MG/ML IV SOLN
14.0000 mL | Freq: Once | INTRAVENOUS | Status: AC | PRN
  Administered 2016-07-29: 14 mL via INTRAVENOUS

## 2016-07-29 MED ORDER — SODIUM CHLORIDE 0.9 % IJ SOLN
INTRAMUSCULAR | Status: AC
  Filled 2016-07-29: qty 50

## 2016-07-29 MED ORDER — NICOTINE 7 MG/24HR TD PT24: 7.0000 mg | MEDICATED_PATCH | Freq: Every day | TRANSDERMAL | 0 refills | Status: DC

## 2016-07-29 NOTE — Discharge Summary (Signed)
Physician Discharge Summary  Lisa Jenkins [id number] DOB: 02/21/1951 DOA: 07/28/2016  PCP: Soyla Dryer, PA-C  Admit date: 07/28/2016 Discharge date: 07/29/2016  Admitted From: Home Disposition:  Home.   Recommendations for Outpatient Follow-up:  1. Follow up with PCP in 1-2 weeks 2. Please obtain BMP/CBC in one week 3.   Please follow up on the following pending results: Abnormal MRI of the abdomen:  Adrenal adenoma..  Home Health: None.  Equipment/Devices: None.  Discharge Condition: No chest pain, no SOB>  CODE STATUS: FULL CODE.  Diet recommendation: Cardiac diet.   Brief/Interim Summary: Patient was admitted into the hospital for atypical chest pain by Dr Nehemiah Settle on Jul 28, 2016. As per his H and P:  " HPI: Lisa Jenkins is a 65 y.o. female with a history of diabetes type 2, hyper-cholesterolemia, hypertension, hypothyroidism. Patient presents with onset of chest pain last night has been progressing. Chest pain radiates into her left arm and back and down left side. It's been worsening. Pain worse with lying on left side. She presented because the pain wasn't improving.  Emergency Department Course: Patient had 2 troponins 3 hours apart that were negative. She had a CTA of her chest, which showed no aortic dissection. However, the patient's CTA showed an incidental left adrenal adenoma or hemorrhage.  HOSPITAL COURSE:  Patient was admitted and troponins were cycled, and they were negative. Her EKG was benign.  Her chest pain has improved, and she had no recurrence.  She did not have any SOB, or lightheadedness.  Though the CTA showed possible adrenal hemorrhage, MRI of the abdomen showed no hemmorrhage, but it did show a 3.1cm adrenal adenoma on the left, suspicious for a benign adrenal adenoma.  She is stable for discharge, and will be discharged today.  She will follow up with her PCP next week.  Thank you and Good  Day.     Discharge Diagnoses:  Principal Problem:    Chest pain Active Problems:   Essential hypertension, benign   Cigarette nicotine dependence, uncomplicated   Type 2 diabetes mellitus with complication ([facility])   Adrenal hemorrhage [facility])    Discharge Instructions  Discharge Instructions    Diet - low sodium heart healthy    Complete by:  As directed    Discharge instructions    Complete by:  As directed    Follow up with your PCP next week. You will need follow up for your adrenal gland tumor as well.   Increase activity slowly    Complete by:  As directed        Medication List    TAKE these medications   amLODipine 5 MG tablet Commonly known as:  NORVASC Take 1 tablet (5 mg total) by mouth daily.   aspirin 81 MG chewable tablet Chew 81 mg by mouth daily.   atorvastatin 20 MG tablet Commonly known as:  LIPITOR Take 1 tablet (20 mg total) by mouth daily. What changed:  when to take this   insulin glargine 100 UNIT/ML injection Commonly known as:  LANTUS Inject 14 Units into the skin at bedtime.   levothyroxine 25 MCG tablet Commonly known as:  SYNTHROID, LEVOTHROID Take 1 tablet (25 mcg total) by mouth daily before breakfast.   lisinopril 40 MG tablet Commonly known as:  PRINIVIL,ZESTRIL TAKE 1 Tablet BY MOUTH ONCE DAILY FOR BLOOD PRESSURE   nicotine 7 mg/24hr patch Commonly known as:  NICODERM CQ - dosed in mg/24 hr Place 1 patch (7 mg total)  onto the skin daily.   sitaGLIPtin-metformin 50-1000 MG tablet Commonly known as:  JANUMET Take 1 tablet by mouth 2 (two) times daily with a meal.       No Known Allergies  Consultations:  None.    Procedures/Studies: Mr Abdomen W Wo Contrast  Result Date: 07/29/2016 CLINICAL DATA:  65 year old female inpatient admitted with chest and abdominal pain, with left adrenal nodule on chest CT. EXAM: MRI ABDOMEN WITHOUT AND WITH CONTRAST TECHNIQUE: Multiplanar multisequence MR imaging of the abdomen was performed both before and after the administration of  intravenous contrast. CONTRAST:  83mL MULTIHANCE GADOBENATE DIMEGLUMINE 529 MG/ML IV SOLN COMPARISON:  07/28/2016 chest CT. FINDINGS: Many of the sequences are motion degraded. Lower chest: Mild scarring versus atelectasis in the dependent right lung base. Hepatobiliary: Normal liver size and configuration. No hepatic steatosis. No liver mass. Normal gallbladder with no convincing cholelithiasis. No biliary ductal dilatation. Common bile duct diameter 5 mm. No choledocholithiasis. No biliary or ampullary mass. No biliary stricture. Pancreas: No pancreatic mass or duct dilation.  No pancreas divisum. Spleen: Normal size. No mass. Adrenals/Urinary Tract: Normal right adrenal. There is a 3.1 x 2.7 cm left adrenal mass (series 7/image 11), which demonstrates uniform prominent loss of signal intensity on out of phase chemical shift imaging, consistent with a benign left adrenal adenoma. No hydronephrosis. Simple subcentimeter renal cysts bilaterally, largest 0.8 cm in the lower left kidney. No suspicious renal masses. Stomach/Bowel: Grossly normal stomach. Visualized small and large bowel is normal caliber, with no bowel wall thickening. Vascular/Lymphatic: Atherosclerotic nonaneurysmal abdominal aorta. Patent portal, splenic, hepatic and renal veins. No pathologically enlarged lymph nodes in the abdomen. Other: No abdominal ascites or focal fluid collection. Musculoskeletal: No aggressive appearing focal osseous lesions. IMPRESSION: 1. Left adrenal adenoma.  No evidence of adrenal hemorrhage. 2. Aortic atherosclerosis. 3. Small simple bilateral renal cysts. 4. Mild scarring versus atelectasis at the right lung base. 5. Otherwise unremarkable MRI abdomen. Electronically Signed   By: Ilona Sorrel M.D.   On: 07/29/2016 10:20   Ct Angio Chest/abd/pel For Dissection W And/or Wo Contrast  Result Date: 07/28/2016 CLINICAL DATA:  Severe left-sided chest pain and back pain for 2 days. Also left abdominal and left back pain.  EXAM: CT ANGIOGRAPHY CHEST, ABDOMEN AND PELVIS TECHNIQUE: Multidetector CT imaging through the chest, abdomen and pelvis was performed using the standard protocol during bolus administration of intravenous contrast. Multiplanar reconstructed images and MIPs were obtained and reviewed to evaluate the vascular anatomy. CONTRAST:  100 cc Isovue 370 intravenously. COMPARISON:  Chest radiograph 08/26/2014 FINDINGS: CTA CHEST FINDINGS Cardiovascular: Preferential opacification of the thoracic aorta. No evidence of thoracic aortic aneurysm or dissection. Moderate calcific atherosclerotic disease of the aorta with calcified and noncalcified plaque throughout. Normal heart size. No pericardial effusion. Advanced calcific atherosclerotic disease of the coronary arteries. Mediastinum/Nodes: No enlarged mediastinal, hilar, or axillary lymph nodes. Thyroid gland, trachea, and esophagus demonstrate no significant findings. Lungs/Pleura: Lungs are clear. No pleural effusion or pneumothorax. Musculoskeletal: No chest wall abnormality. No acute or significant osseous findings. Review of the MIP images confirms the above findings. CTA ABDOMEN AND PELVIS FINDINGS VASCULAR Aorta: Bovine arch configuration. Calcified and noncalcified atherosclerotic disease of the aorta and mild tortuosity. Celiac: Patent without evidence of aneurysm, dissection, vasculitis or significant stenosis. SMA: Annular atherosclerotic calcifications at its origin without flow limiting stenosis. Renals: Right greater than left calcific atherosclerotic disease at the origins with mild approximate 30% stenosis on the right and no significant stenosis on the  left. Accessory left renal artery supplying the inferior pole of the kidney. IMA: Calcified and noncalcified plaque at its origin causing significant lumen narrowing. The distal vessel is normally opacified. Inflow: Patent without evidence of aneurysm, dissection, vasculitis or significant stenosis. Veins: No  obvious venous abnormality within the limitations of this arterial phase study. Review of the MIP images confirms the above findings. NON-VASCULAR Hepatobiliary: No focal liver abnormality is seen. No gallstones, gallbladder wall thickening, or biliary dilatation. Pancreas: Unremarkable. No pancreatic ductal dilatation or surrounding inflammatory changes. Spleen: Normal in size without focal abnormality. Adrenals/Urinary Tract: Enlargement of the left adrenal gland which measures 4.4 x 3.3 by 5.4 cm. Normal cortical medullary differentiation of bilateral kidneys. Numerous linear and punctate calcifications within the kidneys are felt to be vascular. No evidence of hydronephrosis. Left greater than right perinephric stranding. Stomach/Bowel: Stomach is within normal limits. Appendix appears normal. No evidence of bowel wall thickening, distention, or inflammatory changes. Scattered diverticular without evidence of diverticulitis. Lymphatic: Aortic atherosclerosis. No enlarged abdominal or pelvic lymph nodes. Reproductive: Uterus and bilateral adnexa are unremarkable. Other: No abdominal wall hernia or abnormality. No abdominopelvic ascites. Musculoskeletal: No acute or significant osseous findings. Review of the MIP images confirms the above findings. IMPRESSION: Left adrenal gland enlargement. The precontrast attenuation suggests adrenal adenoma. In the settings of acute symptomatology hemorrhage within a large adrenal adenoma may be considered. Advanced calcific atherosclerotic disease of the coronary arteries. Calcified and noncalcified atherosclerotic disease of the aorta without evidence of dissection or aneurysmal dilation. Calcified and noncalcified plaque at the origins of all the main aortic attributes. These results were called by telephone at the time of interpretation on 07/28/2016 at 5:18 pm to Dr. Sherwood Gambler , who verbally acknowledged these results. Electronically Signed   By: Fidela Salisbury  M.D.   On: 07/28/2016 17:18       Subjective: I feel better.    Discharge Exam: Vitals:   07/29/16 0423 07/29/16 0814  BP: (!) 130/58 (!) 153/69  Pulse: 74 (!) 107  Resp: 20 20  Temp: 98.1 F (36.7 C) 99.3 F (37.4 C)   Vitals:   07/28/16 2132 07/29/16 0004 07/29/16 0423 07/29/16 0814  BP: (!) 146/68 133/64 (!) 130/58 (!) 153/69  Pulse: (!) 115 93 74 (!) 107  Resp: 20 19 20 20   Temp: 99 F (37.2 C) 99.2 F (37.3 C) 98.1 F (36.7 C) 99.3 F (37.4 C)  TempSrc: Oral Oral Oral Oral  SpO2: 93% 90% 95% 96%  Weight:      Height:        General: Pt is alert, awake, not in acute distress Cardiovascular: RRR, S1/S2 +, no rubs, no gallops Respiratory: CTA bilaterally, no wheezing, no rhonchi Abdominal: Soft, NT, ND, bowel sounds + Extremities: no edema, no cyanosis    The results of significant diagnostics from this hospitalization (including imaging, microbiology, ancillary and laboratory) are listed below for reference.     Basic Metabolic Panel:  Recent Labs Lab 07/28/16 1224 07/29/16 0703  NA 136 137  K 4.2 4.1  CL 106 106  CO2 22 24  GLUCOSE 324* 163*  BUN 8 6  CREATININE 0.83 0.56  CALCIUM 9.4 9.0   Liver Function Tests:  Recent Labs Lab 07/28/16 1224  AST 11*  ALT 8*  ALKPHOS 94  BILITOT 0.3  PROT 7.2  ALBUMIN 3.5   CBC:  Recent Labs Lab 07/28/16 1224 07/29/16 0703  WBC 10.5 9.9  NEUTROABS 7.5  --   HGB 13.1  12.1  HCT 39.0 36.7  MCV 93.8 93.6  PLT 261 281   Cardiac Enzymes:  Recent Labs Lab 07/28/16 1224 07/28/16 1546 07/28/16 2158  TROPONINI <0.03 <0.03 <0.03   BNP: Invalid input(s): POCBNP CBG:  Recent Labs Lab 07/28/16 2138 07/29/16 0744 07/29/16 1125  GLUCAP 311* 159* 191*   Urinalysis    Component Value Date/Time   COLORURINE YELLOW 07/28/2016 1703   APPEARANCEUR CLEAR 07/28/2016 1703   LABSPEC 1.010 07/28/2016 1703   PHURINE 6.0 07/28/2016 1703   GLUCOSEU >1000 (A) 07/28/2016 1703   HGBUR TRACE (A)  07/28/2016 1703   BILIRUBINUR NEGATIVE 07/28/2016 1703   KETONESUR NEGATIVE 07/28/2016 1703   PROTEINUR 100 (A) 07/28/2016 1703   NITRITE NEGATIVE 07/28/2016 1703   LEUKOCYTESUR NEGATIVE 07/28/2016 1703    Time coordinating discharge: Over 30 minutes  SIGNED:  Orvan Falconer, MD FACP [facility] 07/29/2016, 11:29 AM   If 7PM-7AM, please contact night-coverage [email or web address] Password [email or web address]

## 2016-09-13 ENCOUNTER — Ambulatory Visit: Payer: Self-pay | Admitting: Physician Assistant

## 2016-09-19 ENCOUNTER — Encounter: Payer: Self-pay | Admitting: Physician Assistant

## 2016-09-19 NOTE — Progress Notes (Signed)
Pt was referred to [facility] due to screening mammogram recommending dx mammo and US of the left breast. Pt had appointment scheduled for 08-22-16 at 10:00 am with [facility] for [facility].  Called and spoke to [facility] to f/u per [facility] patient no showed to her appointment.  07-20-17 BD

## 2016-09-25 ENCOUNTER — Other Ambulatory Visit: Payer: Self-pay

## 2016-09-25 DIAGNOSIS — E785 Hyperlipidemia, unspecified: Secondary | ICD-10-CM

## 2016-09-25 DIAGNOSIS — E039 Hypothyroidism, unspecified: Secondary | ICD-10-CM

## 2016-09-25 DIAGNOSIS — I10 Essential (primary) hypertension: Secondary | ICD-10-CM

## 2016-09-25 DIAGNOSIS — E118 Type 2 diabetes mellitus with unspecified complications: Secondary | ICD-10-CM

## 2016-09-28 ENCOUNTER — Ambulatory Visit: Payer: Self-pay | Admitting: Physician Assistant

## 2016-10-21 ENCOUNTER — Other Ambulatory Visit: Payer: Self-pay | Admitting: Physician Assistant

## 2016-12-29 ENCOUNTER — Encounter (HOSPITAL_COMMUNITY): Payer: Self-pay | Admitting: *Deleted

## 2016-12-29 ENCOUNTER — Emergency Department (HOSPITAL_COMMUNITY): Payer: Self-pay

## 2016-12-29 ENCOUNTER — Emergency Department (HOSPITAL_COMMUNITY)
Admission: EM | Admit: 2016-12-29 | Discharge: 2016-12-29 | Disposition: A | Discharge status: Home / Self Care | Payer: Self-pay | Attending: Emergency Medicine | Admitting: Emergency Medicine

## 2016-12-29 DIAGNOSIS — S92501A Displaced unspecified fracture of right lesser toe(s), initial encounter for closed fracture: Secondary | ICD-10-CM

## 2016-12-29 DIAGNOSIS — S92351A Displaced fracture of fifth metatarsal bone, right foot, initial encounter for closed fracture: Secondary | ICD-10-CM | POA: Insufficient documentation

## 2016-12-29 DIAGNOSIS — Y929 Unspecified place or not applicable: Secondary | ICD-10-CM | POA: Insufficient documentation

## 2016-12-29 DIAGNOSIS — Z794 Long term (current) use of insulin: Secondary | ICD-10-CM | POA: Insufficient documentation

## 2016-12-29 DIAGNOSIS — Y999 Unspecified external cause status: Secondary | ICD-10-CM | POA: Insufficient documentation

## 2016-12-29 DIAGNOSIS — E119 Type 2 diabetes mellitus without complications: Secondary | ICD-10-CM | POA: Insufficient documentation

## 2016-12-29 DIAGNOSIS — F17200 Nicotine dependence, unspecified, uncomplicated: Secondary | ICD-10-CM | POA: Insufficient documentation

## 2016-12-29 DIAGNOSIS — X58XXXA Exposure to other specified factors, initial encounter: Secondary | ICD-10-CM | POA: Insufficient documentation

## 2016-12-29 DIAGNOSIS — Z7982 Long term (current) use of aspirin: Secondary | ICD-10-CM | POA: Insufficient documentation

## 2016-12-29 DIAGNOSIS — Y939 Activity, unspecified: Secondary | ICD-10-CM | POA: Insufficient documentation

## 2016-12-29 DIAGNOSIS — I10 Essential (primary) hypertension: Secondary | ICD-10-CM | POA: Insufficient documentation

## 2016-12-29 NOTE — ED Notes (Signed)
Pt alert & oriented x4, stable gait. Patient given discharge instructions, paperwork & prescription(s). Registration completed in room.  Patient verbalized understanding. Pt left department w/ no further questions. 

## 2016-12-29 NOTE — ED Triage Notes (Signed)
Pt reports right foot and right little toe pain x 2 weeks. Pt states her foot began hurting , she looked at it and noticed that her right little toe was dark purple and she put some Dr. Zoe Lan "ingrown toenail" relief. Pt reports the toe has gotten darker the last several days and the pain has increased. Pedal pulses present and pt is able to feel the right small toe.

## 2016-12-29 NOTE — Discharge Instructions (Signed)
Keep the toe buddy taped for at least 2 weeks.  Wear the shoe for support as needed.  Elevate your foot when possible.  Tylenol if needed for pain.

## 2016-12-29 NOTE — ED Triage Notes (Signed)
R foot pain   Dr Laurell Roof is PCP

## 2017-01-01 ENCOUNTER — Encounter: Payer: Self-pay | Admitting: Physician Assistant

## 2017-01-01 ENCOUNTER — Ambulatory Visit: Payer: Self-pay | Admitting: Physician Assistant

## 2017-01-01 VITALS — BP 156/76 | HR 76 | Temp 97.7°F | Wt 139.5 lb

## 2017-01-01 DIAGNOSIS — E039 Hypothyroidism, unspecified: Secondary | ICD-10-CM

## 2017-01-01 DIAGNOSIS — E1165 Type 2 diabetes mellitus with hyperglycemia: Secondary | ICD-10-CM

## 2017-01-01 DIAGNOSIS — E785 Hyperlipidemia, unspecified: Secondary | ICD-10-CM

## 2017-01-01 DIAGNOSIS — I10 Essential (primary) hypertension: Secondary | ICD-10-CM

## 2017-01-01 DIAGNOSIS — E118 Type 2 diabetes mellitus with unspecified complications: Principal | ICD-10-CM

## 2017-01-01 DIAGNOSIS — Z9119 Patient's noncompliance with other medical treatment and regimen: Secondary | ICD-10-CM

## 2017-01-01 DIAGNOSIS — F17219 Nicotine dependence, cigarettes, with unspecified nicotine-induced disorders: Secondary | ICD-10-CM

## 2017-01-01 DIAGNOSIS — Z91199 Patient's noncompliance with other medical treatment and regimen due to unspecified reason: Secondary | ICD-10-CM

## 2017-01-01 DIAGNOSIS — L03031 Cellulitis of right toe: Secondary | ICD-10-CM

## 2017-01-01 MED ORDER — CEPHALEXIN 500 MG PO CAPS: 500.0000 mg | ORAL_CAPSULE | Freq: Four times a day (QID) | ORAL | 0 refills | Status: DC

## 2017-01-01 MED ORDER — LEVOTHYROXINE SODIUM 25 MCG PO TABS: 25.0000 ug | ORAL_TABLET | Freq: Every day | ORAL | 0 refills | Status: DC

## 2017-01-01 MED ORDER — LISINOPRIL 20 MG PO TABS: 40.0000 mg | ORAL_TABLET | Freq: Every day | ORAL | 0 refills | Status: DC

## 2017-01-01 MED ORDER — AMLODIPINE BESYLATE 5 MG PO TABS: 5.0000 mg | ORAL_TABLET | Freq: Every day | ORAL | 0 refills | Status: DC

## 2017-01-01 NOTE — Progress Notes (Signed)
BP (!) 156/76 (BP Location: Left Arm, Patient Position: Sitting, Cuff Size: Normal)   Pulse 76   Temp 97.7 F (36.5 C)   Wt 139 lb 8 oz (63.3 kg)   SpO2 98%   BMI 25.51 kg/m    Subjective:    Patient ID: Lisa Jenkins, female    DOB: 26-Oct-1950, 66 y.o.   MRN: [id number]  HPI: Lisa Jenkins is a 66 y.o. female presenting on 01/01/2017 for Follow-up   HPI   Pt past due for follow-up.  Again.  She only came in now because she is out of most of her meds.    She says she went to ER.  Nurse notes in chart but no provider note.  Pt wearing cast shoe and says her 5th toe is broken.   Pt Says her bs running 200s  Pt denies injury to foot- she says she did not bump it on anything, get stepped on nor walk into anything.  She says it just spontaneously started hurting.  Reviewed xray result done several days ago.  Pt still smoking.   Relevant past medical, surgical, family and social history reviewed and updated as indicated. Interim medical history since our last visit reviewed. Allergies and medications reviewed and updated.   Current Outpatient Prescriptions:  .  aspirin 81 MG chewable tablet, Chew 81 mg by mouth daily. , Disp: , Rfl:  .  insulin glargine (LANTUS) 100 UNIT/ML injection, Inject 14 Units into the skin at bedtime., Disp: , Rfl:  .  sitaGLIPtin-metformin (JANUMET) 50-1000 MG tablet, Take 1 tablet by mouth 2 (two) times daily with a meal., Disp: 180 tablet, Rfl: 1 .  amLODipine (NORVASC) 5 MG tablet, Take 1 tablet (5 mg total) by mouth daily. (Patient not taking: Reported on 01/01/2017), Disp: 90 tablet, Rfl: 2 .  atorvastatin (LIPITOR) 20 MG tablet, Take 1 tablet (20 mg total) by mouth daily. (Patient not taking: Reported on 01/01/2017), Disp: 90 tablet, Rfl: 2 .  levothyroxine (SYNTHROID, LEVOTHROID) 25 MCG tablet, Take 1 tablet (25 mcg total) by mouth daily before breakfast. (Patient not taking: Reported on 01/01/2017), Disp: 90 tablet, Rfl: 1 .  lisinopril  (PRINIVIL,ZESTRIL) 40 MG tablet, TAKE 1 Tablet BY MOUTH ONCE DAILY FOR BLOOD PRESSURE (Patient not taking: Reported on 01/01/2017), Disp: 90 tablet, Rfl: 1 .  nicotine (NICODERM CQ - DOSED IN MG/24 HR) 7 mg/24hr patch, Place 1 patch (7 mg total) onto the skin daily. (Patient not taking: Reported on 01/01/2017), Disp: 28 patch, Rfl: 0   Review of Systems  Constitutional: Negative for appetite change, chills, diaphoresis, fatigue, fever and unexpected weight change.  HENT: Negative for congestion, dental problem, drooling, ear pain, facial swelling, hearing loss, mouth sores, sneezing, sore throat, trouble swallowing and voice change.   Eyes: Negative for pain, discharge, redness, itching and visual disturbance.  Respiratory: Negative for cough, choking, shortness of breath and wheezing.   Cardiovascular: Negative for chest pain, palpitations and leg swelling.  Gastrointestinal: Negative for abdominal pain, blood in stool, constipation, diarrhea and vomiting.  Endocrine: Negative for cold intolerance, heat intolerance and polydipsia.  Genitourinary: Negative for decreased urine volume, dysuria and hematuria.  Musculoskeletal: Positive for gait problem. Negative for arthralgias and back pain.  Skin: Negative for rash.  Allergic/Immunologic: Negative for environmental allergies.  Neurological: Negative for seizures, syncope, light-headedness and headaches.  Hematological: Negative for adenopathy.  Psychiatric/Behavioral: Negative for agitation, dysphoric mood and suicidal ideas. The patient is not nervous/anxious.     Per  HPI unless specifically indicated above     Objective:    BP (!) 156/76 (BP Location: Left Arm, Patient Position: Sitting, Cuff Size: Normal)   Pulse 76   Temp 97.7 F (36.5 C)   Wt 139 lb 8 oz (63.3 kg)   SpO2 98%   BMI 25.51 kg/m   Wt Readings from Last 3 Encounters:  01/01/17 139 lb 8 oz (63.3 kg)  12/29/16 135 lb (61.2 kg)  07/28/16 155 lb 4.8 oz (70.4 kg)     Physical Exam  Constitutional: She is oriented to person, place, and time. She appears well-developed and well-nourished.  HENT:  Head: Normocephalic and atraumatic.  Neck: Neck supple.  Cardiovascular: Normal rate and regular rhythm.   Pulmonary/Chest: Effort normal and breath sounds normal.  Abdominal: Soft. Bowel sounds are normal. She exhibits no mass. There is no hepatosplenomegaly. There is no tenderness.  Musculoskeletal: She exhibits no edema.  Lymphadenopathy:    She has no cervical adenopathy.  Neurological: She is alert and oriented to person, place, and time.  Skin: Skin is warm and dry.  The R pinky toe is red and tender.  The side that touches the 4th toe is macerated but the skin is not yet broken.  The toe has no deformity.   Psychiatric: She has a normal mood and affect. Her behavior is normal.  Vitals reviewed.        Assessment & Plan:   Encounter Diagnoses  Name Primary?  Marland Kitchen Uncontrolled type 2 diabetes mellitus with complication, unspecified whether long term insulin use ([place]) Yes  . Essential hypertension, benign   . Cellulitis of toe of right foot   . Hyperlipidemia, unspecified hyperlipidemia type   . Cigarette nicotine dependence with nicotine-induced disorder   . Hypothyroidism, unspecified type   . Personal history of noncompliance with medical treatment, presenting hazards to health      -Foot exam done today -pt's medassist was renewed.  Pt was given rx to get filled locally so she can get started back on her meds today -pt to get Labs drawn -cellulitis versus early diabetic ulcer right 5th toe.  Pt given amoxil to start today.   -pt is counseled on smoking cessation.  Discussed how stopping now could help her toe -pt to RTO in one week to recheck toe.  Will review labs at that time.  She is to RTO sooner if the toe worsens.  Went over what signs would be considered worsening  (ie increased pain, redness, drainage, swelling, odor, etc)

## 2017-01-01 NOTE — Patient Instructions (Addendum)
Get labs/blood drawn (fasting) Bring in paperwork for medassist Take care of your feet.   Take your antibiotics Stopping smoking would help your feet.

## 2017-01-02 NOTE — ED Provider Notes (Signed)
Lisa Jenkins   CSN: [id number] Arrival date & time: 12/29/16  1928     History   Chief Complaint Chief Complaint  Patient presents with  . Foot Pain    HPI Lisa Jenkins is a 66 y.o. female.  HPI   Lisa Jenkins is a 66 y.o. female who presents to the Emergency Department complaining of right foot pain and fifth toe pain for 2 weeks.  Noticed that her little toe was bruised several days ago.  Pain worse with weight bearing. She denies known injury but states she may have "stumped" her toe unknowingly.  She denies calf pain, numbness of the foot or swelling.  Past Medical History:  Diagnosis Date  . Diabetes mellitus without complication ([place])   . High cholesterol   . Hypertension     Patient Active Problem List   Diagnosis Date Noted  . Chest pain 07/28/2016  . 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 Hypothyroidism 10/21/2015  . Uncontrolled type 2 diabetes mellitus with complication ([place]) 57/84/6962  . Type 2 diabetes mellitus with complication ([place]) 95/28/4132  . Hyperlipidemia 07/21/2015  . Essential hypertension, benign 07/21/2015  . Cigarette nicotine dependence, uncomplicated 44/09/270  . Type 2 diabetes mellitus with retinopathy ([place]) 07/21/2015  . Proteinuria 07/21/2015  . Thyroid activity decreased 07/21/2015    Past Surgical History:  Procedure Laterality Date  . BUNIONECTOMY Bilateral     OB History    Gravida Para Term Preterm AB Living             0   SAB TAB Ectopic Multiple Live Births                   Home Medications    Prior to Admission medications   Medication Sig Start Date End Date Taking? Authorizing Provider  amLODipine (NORVASC) 5 MG tablet Take 1 tablet (5 mg total) by mouth daily. 01/01/17   Soyla Dryer, PA-C  aspirin 81 MG chewable tablet Chew 81 mg by mouth daily.     Historical Provider, MD  atorvastatin (LIPITOR) 20 MG tablet Take 1 tablet (20 mg total) by mouth daily. Patient not taking: Reported  on 01/01/2017 11/08/15   Soyla Dryer, PA-C  cephALEXin (KEFLEX) 500 MG capsule Take 1 capsule (500 mg total) by mouth 4 (four) times daily. 01/01/17   Soyla Dryer, PA-C  insulin glargine (LANTUS) 100 UNIT/ML injection Inject 14 Units into the skin at bedtime.    Historical Provider, MD  levothyroxine (SYNTHROID, LEVOTHROID) 25 MCG tablet Take 1 tablet (25 mcg total) by mouth daily before breakfast. 01/01/17   Soyla Dryer, PA-C  lisinopril (PRINIVIL,ZESTRIL) 20 MG tablet Take 2 tablets (40 mg total) by mouth daily. 01/01/17   Soyla Dryer, PA-C  nicotine (NICODERM CQ - DOSED IN MG/24 HR) 7 mg/24hr patch Place 1 patch (7 mg total) onto the skin daily. Patient not taking: Reported on 01/01/2017 07/29/16   Orvan Falconer, MD  sitaGLIPtin-metformin (JANUMET) 50-1000 MG tablet Take 1 tablet by mouth 2 (two) times daily with a meal. 07/03/16   Soyla Dryer, PA-C    Family History Family History  Problem Relation Age of Onset  . Diabetes Mother   . Heart disease Mother   . Heart disease Father   . Diabetes Sister   . Heart disease Sister   . Diabetes Brother   . Heart disease Brother   . Diabetes Brother   . Heart disease Brother   . Diabetes Brother   .  Heart disease Brother   . Diabetes Brother   . Heart disease Brother   . Diabetes Brother   . Heart disease Brother     Social History Social History  Substance Use Topics  . Smoking status: Current Every Day Smoker    Packs/day: 0.50    Years: 46.00  . Smokeless tobacco: Never Used  . Alcohol use No     Allergies   Patient has no known allergies.   Review of Systems Review of Systems  Constitutional: Negative for chills and fever.  Musculoskeletal: Positive for arthralgias (right foot pain). Negative for joint swelling.  Skin: Positive for color change. Negative for wound.  Neurological: Negative for weakness and numbness.  All other systems reviewed and are negative.    Physical Exam Updated Vital Signs BP  136/66 (BP Location: Left Arm)   Pulse 81   Temp 98.2 F (36.8 C) (Oral)   Resp 18   Ht 5\' 2"  (1.575 m)   Wt 61.2 kg   SpO2 99%   BMI 24.69 kg/m   Physical Exam  Constitutional: She is oriented to person, place, and time. She appears well-developed and well-nourished. No distress.  HENT:  Head: Normocephalic.  Mouth/Throat: Oropharynx is clear and moist.  Neck: Normal range of motion. Neck supple. No Kernig's sign noted. No thyromegaly present.  Cardiovascular: Normal rate, regular rhythm, normal heart sounds and intact distal pulses.   Pulmonary/Chest: Effort normal and breath sounds normal. She has no wheezes.  Abdominal: Normal appearance.  Musculoskeletal: Normal range of motion. She exhibits tenderness. She exhibits no deformity.  ttp of the dorsal right foot and fifth toe.  Bruising of the toe.  Skin is warm and dry.  No edema, no bony deformity  Neurological: She is alert and oriented to person, place, and time. No sensory deficit.  Skin: Skin is warm and dry. Capillary refill takes less than 2 seconds. No rash noted.     ED Treatments / Results  Labs (all labs ordered are listed, but only abnormal results are displayed) Labs Reviewed - No data to display  EKG  EKG Interpretation None       Radiology No results found.  Procedures Procedures (including critical care time)  Medications Ordered in ED Medications - No data to display   Initial Impression / Assessment and Plan / ED Course  I have reviewed the triage vital signs and the nursing notes.  Pertinent labs & imaging results that were available during my care of the patient were reviewed by me and considered in my medical decision making (see chart for details).     NV intact.  Xr shows fx of the fifth toe.    Toes buddy taped and post op shoe applied by nursing.  Orthopedic referral given.  Pt agrees to RICE therapy and orthopedic f/u.    Final Clinical Impressions(s) / ED Diagnoses   Final  diagnoses:  Fracture of fifth toe, right, closed, initial encounter    New Prescriptions Discharge Medication List as of 12/29/2016  9:02 PM       Param Capri Ammie Ferrier 01/02/17 2336    Milton Ferguson, MD 01/03/17 1557

## 2017-01-08 ENCOUNTER — Ambulatory Visit: Payer: Self-pay | Admitting: Physician Assistant

## 2017-01-08 ENCOUNTER — Encounter: Payer: Self-pay | Admitting: Physician Assistant

## 2017-01-08 ENCOUNTER — Other Ambulatory Visit (HOSPITAL_COMMUNITY)
Admission: RE | Admit: 2017-01-08 | Discharge: 2017-01-08 | Disposition: A | Discharge status: Home / Self Care | Payer: Self-pay | Source: Ambulatory Visit | Attending: Physician Assistant | Admitting: Physician Assistant

## 2017-01-08 VITALS — BP 138/64 | HR 74 | Temp 98.2°F | Ht 62.0 in | Wt 141.0 lb

## 2017-01-08 DIAGNOSIS — E785 Hyperlipidemia, unspecified: Secondary | ICD-10-CM

## 2017-01-08 DIAGNOSIS — I10 Essential (primary) hypertension: Secondary | ICD-10-CM

## 2017-01-08 DIAGNOSIS — L03031 Cellulitis of right toe: Secondary | ICD-10-CM

## 2017-01-08 DIAGNOSIS — E118 Type 2 diabetes mellitus with unspecified complications: Principal | ICD-10-CM

## 2017-01-08 DIAGNOSIS — E039 Hypothyroidism, unspecified: Secondary | ICD-10-CM

## 2017-01-08 DIAGNOSIS — E1165 Type 2 diabetes mellitus with hyperglycemia: Secondary | ICD-10-CM

## 2017-01-08 DIAGNOSIS — F17219 Nicotine dependence, cigarettes, with unspecified nicotine-induced disorders: Secondary | ICD-10-CM

## 2017-01-08 LAB — COMPREHENSIVE METABOLIC PANEL
ALK PHOS: 78 U/L (ref 38–126)
ALT: 11 U/L — AB (ref 14–54)
AST: 13 U/L — ABNORMAL LOW (ref 15–41)
Albumin: 3.6 g/dL (ref 3.5–5.0)
Anion gap: 7 (ref 5–15)
BILIRUBIN TOTAL: 0.6 mg/dL (ref 0.3–1.2)
BUN: 16 mg/dL (ref 6–20)
CALCIUM: 9.6 mg/dL (ref 8.9–10.3)
CHLORIDE: 103 mmol/L (ref 101–111)
CO2: 23 mmol/L (ref 22–32)
CREATININE: 0.76 mg/dL (ref 0.44–1.00)
Glucose, Bld: 194 mg/dL — ABNORMAL HIGH (ref 65–99)
Potassium: 4.3 mmol/L (ref 3.5–5.1)
Sodium: 133 mmol/L — ABNORMAL LOW (ref 135–145)
TOTAL PROTEIN: 6.8 g/dL (ref 6.5–8.1)

## 2017-01-08 LAB — LIPID PANEL
Cholesterol: 276 mg/dL — ABNORMAL HIGH (ref 0–200)
HDL: 40 mg/dL — AB (ref >40)
LDL Cholesterol: 202 mg/dL — ABNORMAL HIGH (ref 0–99)
TRIGLYCERIDES: 168 mg/dL — AB (ref <150)
Total CHOL/HDL Ratio: 6.9 RATIO
VLDL: 34 mg/dL (ref 0–40)

## 2017-01-08 LAB — CBC WITH DIFFERENTIAL/PLATELET
Basophils Absolute: 0.1 10*3/uL (ref 0.0–0.1)
Basophils Relative: 1 %
EOS PCT: 2 %
Eosinophils Absolute: 0.1 10*3/uL (ref 0.0–0.7)
HCT: 41.7 % (ref 36.0–46.0)
Hemoglobin: 13.9 g/dL (ref 12.0–15.0)
LYMPHS ABS: 2.3 10*3/uL (ref 0.7–4.0)
LYMPHS PCT: 31 %
MCH: 31 pg (ref 26.0–34.0)
MCHC: 33.3 g/dL (ref 30.0–36.0)
MCV: 92.9 fL (ref 78.0–100.0)
Monocytes Absolute: 0.5 10*3/uL (ref 0.1–1.0)
Monocytes Relative: 7 %
Neutro Abs: 4.2 10*3/uL (ref 1.7–7.7)
Neutrophils Relative %: 59 %
PLATELETS: 273 10*3/uL (ref 150–400)
RBC: 4.49 MIL/uL (ref 3.87–5.11)
RDW: 13.5 % (ref 11.5–15.5)
WBC: 7.2 10*3/uL (ref 4.0–10.5)

## 2017-01-08 LAB — TSH: TSH: 6.519 u[IU]/mL — AB (ref 0.350–4.500)

## 2017-01-08 NOTE — Progress Notes (Signed)
BP 138/64 (BP Location: Left Arm, Patient Position: Sitting, Cuff Size: Normal)   Pulse 74   Temp 98.2 F (36.8 C)   Ht 5\' 2"  (1.575 m)   Wt 141 lb (64 kg)   SpO2 98%   BMI 25.79 kg/m    Subjective:    Patient ID: Lisa Jenkins, female    DOB: 1951-06-21, 66 y.o.   MRN: [id number]  HPI: Lisa Jenkins is a 66 y.o. female presenting on 01/08/2017 for Toe Pain (f/u)   HPI   Pt states bs 170 this a.m. Yesterday it was 213.   None less than 150.   Pt says her toe is feeling better.  She is wearing her regular shoes again.  Relevant past medical, surgical, family and social history reviewed and updated as indicated. Interim medical history since our last visit reviewed. Allergies and medications reviewed and updated.   Current Outpatient Prescriptions:  .  amLODipine (NORVASC) 5 MG tablet, Take 1 tablet (5 mg total) by mouth daily., Disp: 30 tablet, Rfl: 0 .  aspirin 81 MG chewable tablet, Chew 81 mg by mouth daily. , Disp: , Rfl:  .  atorvastatin (LIPITOR) 20 MG tablet, Take 1 tablet (20 mg total) by mouth daily., Disp: 90 tablet, Rfl: 2 .  cephALEXin (KEFLEX) 500 MG capsule, Take 1 capsule (500 mg total) by mouth 4 (four) times daily., Disp: 28 capsule, Rfl: 0 .  insulin glargine (LANTUS) 100 UNIT/ML injection, Inject 14 Units into the skin at bedtime., Disp: , Rfl:  .  lisinopril (PRINIVIL,ZESTRIL) 20 MG tablet, Take 2 tablets (40 mg total) by mouth daily., Disp: 60 tablet, Rfl: 0 .  sitaGLIPtin-metformin (JANUMET) 50-1000 MG tablet, Take 1 tablet by mouth 2 (two) times daily with a meal., Disp: 180 tablet, Rfl: 1 .  levothyroxine (SYNTHROID, LEVOTHROID) 25 MCG tablet, Take 1 tablet (25 mcg total) by mouth daily before breakfast., Disp: 30 tablet, Rfl: 0 .  nicotine (NICODERM CQ - DOSED IN MG/24 HR) 7 mg/24hr patch, Place 1 patch (7 mg total) onto the skin daily. (Patient not taking: Reported on 01/01/2017), Disp: 28 patch, Rfl: 0  Review of Systems  Constitutional: Negative for  appetite change, chills, diaphoresis, fatigue, fever and unexpected weight change.  HENT: Negative for congestion, dental problem, drooling, ear pain, facial swelling, hearing loss, mouth sores, sneezing, sore throat, trouble swallowing and voice change.   Eyes: Negative for pain, discharge, redness, itching and visual disturbance.  Respiratory: Negative for cough, choking, shortness of breath and wheezing.   Cardiovascular: Negative for chest pain, palpitations and leg swelling.  Gastrointestinal: Negative for abdominal pain, blood in stool, constipation, diarrhea and vomiting.  Endocrine: Negative for cold intolerance, heat intolerance and polydipsia.  Genitourinary: Negative for decreased urine volume, dysuria and hematuria.  Musculoskeletal: Negative for arthralgias, back pain and gait problem.  Skin: Negative for rash.  Allergic/Immunologic: Negative for environmental allergies.  Neurological: Negative for seizures, syncope, light-headedness and headaches.  Hematological: Negative for adenopathy.  Psychiatric/Behavioral: Negative for agitation, dysphoric mood and suicidal ideas. The patient is not nervous/anxious.     Per HPI unless specifically indicated above     Objective:    BP 138/64 (BP Location: Left Arm, Patient Position: Sitting, Cuff Size: Normal)   Pulse 74   Temp 98.2 F (36.8 C)   Ht 5\' 2"  (1.575 m)   Wt 141 lb (64 kg)   SpO2 98%   BMI 25.79 kg/m   Wt Readings from Last 3 Encounters:  01/08/17 141 lb (64 kg)  01/01/17 139 lb 8 oz (63.3 kg)  12/29/16 135 lb (61.2 kg)    Physical Exam  Constitutional: She is oriented to person, place, and time. She appears well-developed and well-nourished.  HENT:  Head: Normocephalic and atraumatic.  Neck: Neck supple.  Cardiovascular: Normal rate and regular rhythm.   Pulmonary/Chest: Effort normal and breath sounds normal.  Abdominal: Soft. Bowel sounds are normal. She exhibits no mass. There is no hepatosplenomegaly.  There is no tenderness.  Musculoskeletal: She exhibits no edema.  Lymphadenopathy:    She has no cervical adenopathy.  Neurological: She is alert and oriented to person, place, and time.  Skin: Skin is warm and dry.  Psychiatric: She has a normal mood and affect. Her behavior is normal.  Vitals reviewed.   Results for orders placed or performed during the hospital encounter of 01/08/17  TSH  Result Value Ref Range   TSH 6.519 (H) 0.350 - 4.500 uIU/mL  Lipid panel  Result Value Ref Range   Cholesterol 276 (H) 0 - 200 mg/dL   Triglycerides 168 (H) <150 mg/dL   HDL 40 (L) >40 mg/dL   Total CHOL/HDL Ratio 6.9 RATIO   VLDL 34 0 - 40 mg/dL   LDL Cholesterol 202 (H) 0 - 99 mg/dL  CBC with Differential/Platelet  Result Value Ref Range   WBC 7.2 4.0 - 10.5 K/uL   RBC 4.49 3.87 - 5.11 MIL/uL   Hemoglobin 13.9 12.0 - 15.0 g/dL   HCT 41.7 36.0 - 46.0 %   MCV 92.9 78.0 - 100.0 fL   MCH 31.0 26.0 - 34.0 pg   MCHC 33.3 30.0 - 36.0 g/dL   RDW 13.5 11.5 - 15.5 %   Platelets 273 150 - 400 K/uL   Neutrophils Relative % 59 %   Neutro Abs 4.2 1.7 - 7.7 K/uL   Lymphocytes Relative 31 %   Lymphs Abs 2.3 0.7 - 4.0 K/uL   Monocytes Relative 7 %   Monocytes Absolute 0.5 0.1 - 1.0 K/uL   Eosinophils Relative 2 %   Eosinophils Absolute 0.1 0.0 - 0.7 K/uL   Basophils Relative 1 %   Basophils Absolute 0.1 0.0 - 0.1 K/uL  Comprehensive metabolic panel  Result Value Ref Range   Sodium 133 (L) 135 - 145 mmol/L   Potassium 4.3 3.5 - 5.1 mmol/L   Chloride 103 101 - 111 mmol/L   CO2 23 22 - 32 mmol/L   Glucose, Bld 194 (H) 65 - 99 mg/dL   BUN 16 6 - 20 mg/dL   Creatinine, Ser 0.76 0.44 - 1.00 mg/dL   Calcium 9.6 8.9 - 10.3 mg/dL   Total Protein 6.8 6.5 - 8.1 g/dL   Albumin 3.6 3.5 - 5.0 g/dL   AST 13 (L) 15 - 41 U/L   ALT 11 (L) 14 - 54 U/L   Alkaline Phosphatase 78 38 - 126 U/L   Total Bilirubin 0.6 0.3 - 1.2 mg/dL   GFR calc non Af Amer >60 >60 mL/min   GFR calc Af Amer >60 >60 mL/min    Anion gap 7 5 - 15      Assessment & Plan:   Encounter Diagnoses  Name Primary?  Marland Kitchen Uncontrolled type 2 diabetes mellitus with complication, unspecified whether long term insulin use ([place]) Yes  . Cellulitis of toe of right foot   . Essential hypertension, benign   . Hyperlipidemia, unspecified hyperlipidemia type   . Cigarette nicotine dependence with nicotine-induced disorder   .  Hypothyroidism, unspecified type     -reviewed labs with pt.  a1c still pending (pt just had labs drawn this morning) -pt to Increase lantus to 20u. -pt counseled to review her medicaiton list and make sure she is taking everything she is supposed to -pt counseled to Watch toe.  She is to check her feet daily and RTO immediately  For any worsening -pt is counseled on smoking cessation -f/u with bs log 1 month

## 2017-01-09 LAB — HEMOGLOBIN A1C
HEMOGLOBIN A1C: 13.7 % — AB (ref 4.8–5.6)
MEAN PLASMA GLUCOSE: 346 mg/dL

## 2017-01-18 ENCOUNTER — Telehealth: Payer: Self-pay | Admitting: Student

## 2017-01-18 NOTE — Telephone Encounter (Signed)
Pt was notified to make sure to eat 3 meals a day and to not skip meals. Pt notified no medications changes made. Pt is to call if FBS <70 or >300. Pt verbalized understanding.

## 2017-01-18 NOTE — Telephone Encounter (Addendum)
Pt called c/o blurred vision and FBS today 01-18-17. When asked if she had eaten anything last night patient stated she had not. When asked to confirm that she had not eaten last night, pt stated she ate fish at 6:30PM last night. Will call pt back.  PA advised for patient to make sure to eat her 3 meals daily and to not skip meals. No medication changes are to be made. Pt is to call the office if FBS is <70 or >300

## 2017-02-08 ENCOUNTER — Ambulatory Visit: Payer: Self-pay | Admitting: Physician Assistant

## 2017-02-08 ENCOUNTER — Encounter: Payer: Self-pay | Admitting: Physician Assistant

## 2017-02-08 ENCOUNTER — Other Ambulatory Visit: Payer: Self-pay | Admitting: Physician Assistant

## 2017-02-08 VITALS — BP 162/70 | HR 76 | Temp 98.1°F | Ht 62.0 in | Wt 143.5 lb

## 2017-02-08 DIAGNOSIS — Z1211 Encounter for screening for malignant neoplasm of colon: Secondary | ICD-10-CM

## 2017-02-08 DIAGNOSIS — E1165 Type 2 diabetes mellitus with hyperglycemia: Secondary | ICD-10-CM

## 2017-02-08 DIAGNOSIS — E118 Type 2 diabetes mellitus with unspecified complications: Principal | ICD-10-CM

## 2017-02-08 MED ORDER — AMLODIPINE BESYLATE 5 MG PO TABS: 5.0000 mg | ORAL_TABLET | Freq: Every day | ORAL | 1 refills | Status: DC

## 2017-02-08 MED ORDER — ATORVASTATIN CALCIUM 20 MG PO TABS: 20.0000 mg | ORAL_TABLET | Freq: Every day | ORAL | 2 refills | Status: DC

## 2017-02-08 MED ORDER — LEVOTHYROXINE SODIUM 25 MCG PO TABS: 25.0000 ug | ORAL_TABLET | Freq: Every day | ORAL | 2 refills | Status: DC

## 2017-02-08 MED ORDER — LISINOPRIL 20 MG PO TABS: 40.0000 mg | ORAL_TABLET | Freq: Every day | ORAL | 1 refills | Status: DC

## 2017-02-08 NOTE — Progress Notes (Signed)
BP (!) 162/70 (BP Location: Left Arm, Patient Position: Sitting, Cuff Size: Normal)   Pulse 76   Temp 98.1 F (36.7 C)   Ht 5\' 2"  (1.575 m)   Wt 143 lb 8 oz (65.1 kg)   SpO2 97%   BMI 26.25 kg/m    Subjective:    Patient ID: Lisa Jenkins, female    DOB: Jan 14, 1951, 66 y.o.   MRN: [id number]  HPI: JACLYNNE BALDO is a 66 y.o. female presenting on 02/08/2017 for Diabetes   HPI  a1c done last month 13.7.  Pt with bs log= this week range from 51-89.  Pt says she has been eating properly more recently  Relevant past medical, surgical, family and social history reviewed and updated as indicated. Interim medical history since our last visit reviewed. Allergies and medications reviewed and updated.   Current Outpatient Prescriptions:  .  aspirin 81 MG chewable tablet, Chew 81 mg by mouth daily. , Disp: , Rfl:  .  insulin glargine (LANTUS) 100 UNIT/ML injection, Inject 20 Units into the skin at bedtime. , Disp: , Rfl:  .  sitaGLIPtin-metformin (JANUMET) 50-1000 MG tablet, Take 1 tablet by mouth 2 (two) times daily with a meal., Disp: 180 tablet, Rfl: 1 .  amLODipine (NORVASC) 5 MG tablet, Take 1 tablet (5 mg total) by mouth daily. (Patient not taking: Reported on 02/08/2017), Disp: 30 tablet, Rfl: 0 .  atorvastatin (LIPITOR) 20 MG tablet, Take 1 tablet (20 mg total) by mouth daily. (Patient not taking: Reported on 02/08/2017), Disp: 90 tablet, Rfl: 2 .  levothyroxine (SYNTHROID, LEVOTHROID) 25 MCG tablet, Take 1 tablet (25 mcg total) by mouth daily before breakfast. (Patient not taking: Reported on 02/08/2017), Disp: 30 tablet, Rfl: 0 .  lisinopril (PRINIVIL,ZESTRIL) 20 MG tablet, Take 2 tablets (40 mg total) by mouth daily. (Patient not taking: Reported on 02/08/2017), Disp: 60 tablet, Rfl: 0 .  nicotine (NICODERM CQ - DOSED IN MG/24 HR) 7 mg/24hr patch, Place 1 patch (7 mg total) onto the skin daily. (Patient not taking: Reported on 01/01/2017), Disp: 28 patch, Rfl: 0  Review of Systems   Constitutional: Negative for appetite change, chills, diaphoresis, fatigue, fever and unexpected weight change.  HENT: Negative for congestion, dental problem, drooling, ear pain, facial swelling, hearing loss, mouth sores, sneezing, sore throat, trouble swallowing and voice change.   Eyes: Negative for pain, discharge, redness, itching and visual disturbance.  Respiratory: Negative for cough, choking, shortness of breath and wheezing.   Cardiovascular: Positive for leg swelling. Negative for chest pain and palpitations.  Gastrointestinal: Negative for abdominal pain, blood in stool, constipation, diarrhea and vomiting.  Endocrine: Negative for cold intolerance, heat intolerance and polydipsia.  Genitourinary: Negative for decreased urine volume, dysuria and hematuria.  Musculoskeletal: Negative for arthralgias, back pain and gait problem.  Skin: Negative for rash.  Allergic/Immunologic: Negative for environmental allergies.  Neurological: Negative for seizures, syncope, light-headedness and headaches.  Hematological: Negative for adenopathy.  Psychiatric/Behavioral: Negative for agitation, dysphoric mood and suicidal ideas. The patient is not nervous/anxious.     Per HPI unless specifically indicated above     Objective:    BP (!) 162/70 (BP Location: Left Arm, Patient Position: Sitting, Cuff Size: Normal)   Pulse 76   Temp 98.1 F (36.7 C)   Ht 5\' 2"  (1.575 m)   Wt 143 lb 8 oz (65.1 kg)   SpO2 97%   BMI 26.25 kg/m   Wt Readings from Last 3 Encounters:  02/08/17 143  lb 8 oz (65.1 kg)  01/08/17 141 lb (64 kg)  01/01/17 139 lb 8 oz (63.3 kg)    Physical Exam  Constitutional: She is oriented to person, place, and time. She appears well-developed and well-nourished.  HENT:  Head: Normocephalic and atraumatic.  Neck: Neck supple.  Cardiovascular: Normal rate and regular rhythm.   Pulmonary/Chest: Effort normal and breath sounds normal.  Abdominal: Soft. Bowel sounds are  normal. She exhibits no mass. There is no hepatosplenomegaly. There is no tenderness.  Musculoskeletal: She exhibits no edema.  Lymphadenopathy:    She has no cervical adenopathy.  Neurological: She is alert and oriented to person, place, and time.  Skin: Skin is warm and dry.  Psychiatric: She has a normal mood and affect. Her behavior is normal.  Vitals reviewed.   Results for orders placed or performed during the hospital encounter of 01/08/17  TSH  Result Value Ref Range   TSH 6.519 (H) 0.350 - 4.500 uIU/mL  Hemoglobin A1c  Result Value Ref Range   Hgb A1c MFr Bld 13.7 (H) 4.8 - 5.6 %   Mean Plasma Glucose 346 mg/dL  Lipid panel  Result Value Ref Range   Cholesterol 276 (H) 0 - 200 mg/dL   Triglycerides 168 (H) <150 mg/dL   HDL 40 (L) >40 mg/dL   Total CHOL/HDL Ratio 6.9 RATIO   VLDL 34 0 - 40 mg/dL   LDL Cholesterol 202 (H) 0 - 99 mg/dL  CBC with Differential/Platelet  Result Value Ref Range   WBC 7.2 4.0 - 10.5 K/uL   RBC 4.49 3.87 - 5.11 MIL/uL   Hemoglobin 13.9 12.0 - 15.0 g/dL   HCT 41.7 36.0 - 46.0 %   MCV 92.9 78.0 - 100.0 fL   MCH 31.0 26.0 - 34.0 pg   MCHC 33.3 30.0 - 36.0 g/dL   RDW 13.5 11.5 - 15.5 %   Platelets 273 150 - 400 K/uL   Neutrophils Relative % 59 %   Neutro Abs 4.2 1.7 - 7.7 K/uL   Lymphocytes Relative 31 %   Lymphs Abs 2.3 0.7 - 4.0 K/uL   Monocytes Relative 7 %   Monocytes Absolute 0.5 0.1 - 1.0 K/uL   Eosinophils Relative 2 %   Eosinophils Absolute 0.1 0.0 - 0.7 K/uL   Basophils Relative 1 %   Basophils Absolute 0.1 0.0 - 0.1 K/uL  Comprehensive metabolic panel  Result Value Ref Range   Sodium 133 (L) 135 - 145 mmol/L   Potassium 4.3 3.5 - 5.1 mmol/L   Chloride 103 101 - 111 mmol/L   CO2 23 22 - 32 mmol/L   Glucose, Bld 194 (H) 65 - 99 mg/dL   BUN 16 6 - 20 mg/dL   Creatinine, Ser 0.76 0.44 - 1.00 mg/dL   Calcium 9.6 8.9 - 10.3 mg/dL   Total Protein 6.8 6.5 - 8.1 g/dL   Albumin 3.6 3.5 - 5.0 g/dL   AST 13 (L) 15 - 41 U/L   ALT  11 (L) 14 - 54 U/L   Alkaline Phosphatase 78 38 - 126 U/L   Total Bilirubin 0.6 0.3 - 1.2 mg/dL   GFR calc non Af Amer >60 >60 mL/min   GFR calc Af Amer >60 >60 mL/min   Anion gap 7 5 - 15      Assessment & Plan:   Encounter Diagnoses  Name Primary?  Marland Kitchen Uncontrolled type 2 diabetes mellitus with complication, unspecified whether long term insulin use ([place]) Yes  . Screening  for colon cancer     -counseled pt to not run out of meds -in light of recent low bs, will Decrease to 15u lantus. Pt reminded to call office for fbs > 300 or < 70 -pt given iFOBT for colon cancer screening -F/u 38months. RTO sooner prn

## 2017-02-14 ENCOUNTER — Other Ambulatory Visit: Payer: Self-pay | Admitting: Physician Assistant

## 2017-02-14 ENCOUNTER — Encounter: Payer: Self-pay | Admitting: Physician Assistant

## 2017-02-14 DIAGNOSIS — I10 Essential (primary) hypertension: Secondary | ICD-10-CM

## 2017-02-14 DIAGNOSIS — E1165 Type 2 diabetes mellitus with hyperglycemia: Secondary | ICD-10-CM

## 2017-02-14 DIAGNOSIS — E118 Type 2 diabetes mellitus with unspecified complications: Principal | ICD-10-CM

## 2017-02-14 DIAGNOSIS — E785 Hyperlipidemia, unspecified: Secondary | ICD-10-CM

## 2017-02-14 DIAGNOSIS — E039 Hypothyroidism, unspecified: Secondary | ICD-10-CM

## 2017-02-14 NOTE — Progress Notes (Signed)
Pt called 02-12-17 stating her FBS had been low at 70. Pt states she had eaten two hotdogs the night before and gave herself her insulin. Pt states she has supper around 5-6pm and goes to bed around 9-10pm.  PA advises pt to eat a small snack before she goes to bed (ie. crackers, apple). Considering patient's high a1c of 13.7 from 01-08-17 no changes to her insulin will be made.  LPN notified pt on 2-95-74 and pt verbalized understanding.

## 2017-02-26 LAB — IFOBT (OCCULT BLOOD): IFOBT: NEGATIVE

## 2017-03-12 ENCOUNTER — Encounter: Payer: Self-pay | Admitting: Physician Assistant

## 2017-03-12 ENCOUNTER — Other Ambulatory Visit: Payer: Self-pay | Admitting: Physician Assistant

## 2017-03-12 MED ORDER — METFORMIN HCL 1000 MG PO TABS: 1000.0000 mg | ORAL_TABLET | Freq: Two times a day (BID) | ORAL | 2 refills | Status: DC

## 2017-03-12 NOTE — Progress Notes (Signed)
Pt called stating she took her last Janumet today (03-12-17). Pt would like to be switched to something cheaper since she has not been able renew with [facility] (pt is to submit Notice of Award and Benefit letter from social security in order to renew with [facility]).   Pt is to start on metformin and increase Lantus insulin to 20 units QHS (pt previously on 15 units). Pt is to call the office if FBS is <70 or >300. Pt verbalized understanding. rx for metformin will be sent to [facility], per patient's request.

## 2017-05-04 ENCOUNTER — Other Ambulatory Visit (HOSPITAL_COMMUNITY)
Admission: RE | Admit: 2017-05-04 | Discharge: 2017-05-04 | Disposition: A | Discharge status: Home / Self Care | Payer: Self-pay | Source: Ambulatory Visit | Attending: Physician Assistant | Admitting: Physician Assistant

## 2017-05-04 DIAGNOSIS — E785 Hyperlipidemia, unspecified: Secondary | ICD-10-CM | POA: Insufficient documentation

## 2017-05-04 DIAGNOSIS — E118 Type 2 diabetes mellitus with unspecified complications: Secondary | ICD-10-CM | POA: Insufficient documentation

## 2017-05-04 DIAGNOSIS — I10 Essential (primary) hypertension: Secondary | ICD-10-CM | POA: Insufficient documentation

## 2017-05-04 DIAGNOSIS — E1165 Type 2 diabetes mellitus with hyperglycemia: Secondary | ICD-10-CM | POA: Insufficient documentation

## 2017-05-04 DIAGNOSIS — E039 Hypothyroidism, unspecified: Secondary | ICD-10-CM | POA: Insufficient documentation

## 2017-05-04 LAB — COMPREHENSIVE METABOLIC PANEL
ALBUMIN: 3.5 g/dL (ref 3.5–5.0)
ALT: 7 U/L — ABNORMAL LOW (ref 14–54)
AST: 11 U/L — ABNORMAL LOW (ref 15–41)
Alkaline Phosphatase: 92 U/L (ref 38–126)
Anion gap: 9 (ref 5–15)
BILIRUBIN TOTAL: 0.4 mg/dL (ref 0.3–1.2)
BUN: 11 mg/dL (ref 6–20)
CO2: 27 mmol/L (ref 22–32)
Calcium: 9.2 mg/dL (ref 8.9–10.3)
Chloride: 98 mmol/L — ABNORMAL LOW (ref 101–111)
Creatinine, Ser: 0.73 mg/dL (ref 0.44–1.00)
GFR calc Af Amer: >60 mL/min (ref >60)
GFR calc non Af Amer: >60 mL/min (ref >60)
GLUCOSE: 319 mg/dL — AB (ref 65–99)
POTASSIUM: 3.8 mmol/L (ref 3.5–5.1)
SODIUM: 134 mmol/L — AB (ref 135–145)
TOTAL PROTEIN: 7.4 g/dL (ref 6.5–8.1)

## 2017-05-04 LAB — TSH: TSH: 4.429 u[IU]/mL (ref 0.350–4.500)

## 2017-05-04 LAB — LIPID PANEL
CHOL/HDL RATIO: 2.8 ratio
CHOLESTEROL: 165 mg/dL (ref 0–200)
HDL: 60 mg/dL (ref >40)
LDL Cholesterol: 80 mg/dL (ref 0–99)
Triglycerides: 125 mg/dL (ref <150)
VLDL: 25 mg/dL (ref 0–40)

## 2017-05-04 LAB — HEMOGLOBIN A1C
Hgb A1c MFr Bld: 6.6 % — ABNORMAL HIGH (ref 4.8–5.6)
MEAN PLASMA GLUCOSE: 142.72 mg/dL

## 2017-05-05 LAB — MICROALBUMIN, URINE: Microalb, Ur: 4294.4 ug/mL — ABNORMAL HIGH

## 2017-05-10 ENCOUNTER — Ambulatory Visit: Payer: Self-pay | Admitting: Physician Assistant

## 2017-05-10 ENCOUNTER — Encounter: Payer: Self-pay | Admitting: Physician Assistant

## 2017-05-10 VITALS — BP 130/74 | HR 90 | Temp 98.4°F | Ht 62.0 in | Wt 145.0 lb

## 2017-05-10 DIAGNOSIS — E039 Hypothyroidism, unspecified: Secondary | ICD-10-CM

## 2017-05-10 DIAGNOSIS — E11319 Type 2 diabetes mellitus with unspecified diabetic retinopathy without macular edema: Secondary | ICD-10-CM

## 2017-05-10 DIAGNOSIS — Z1239 Encounter for other screening for malignant neoplasm of breast: Secondary | ICD-10-CM

## 2017-05-10 DIAGNOSIS — I10 Essential (primary) hypertension: Secondary | ICD-10-CM

## 2017-05-10 DIAGNOSIS — E785 Hyperlipidemia, unspecified: Secondary | ICD-10-CM

## 2017-05-10 DIAGNOSIS — F17210 Nicotine dependence, cigarettes, uncomplicated: Secondary | ICD-10-CM

## 2017-05-10 DIAGNOSIS — R809 Proteinuria, unspecified: Secondary | ICD-10-CM

## 2017-05-10 MED ORDER — ATORVASTATIN CALCIUM 20 MG PO TABS: 20.0000 mg | ORAL_TABLET | Freq: Every day | ORAL | 3 refills | Status: DC

## 2017-05-10 MED ORDER — LISINOPRIL 20 MG PO TABS: 40.0000 mg | ORAL_TABLET | Freq: Every day | ORAL | 3 refills | Status: DC

## 2017-05-10 MED ORDER — LEVOTHYROXINE SODIUM 25 MCG PO TABS: 25.0000 ug | ORAL_TABLET | Freq: Every day | ORAL | 3 refills | Status: DC

## 2017-05-10 MED ORDER — AMLODIPINE BESYLATE 5 MG PO TABS: 5.0000 mg | ORAL_TABLET | Freq: Every day | ORAL | 3 refills | Status: DC

## 2017-05-10 NOTE — Progress Notes (Signed)
BP 130/74 (BP Location: Left Arm, Patient Position: Sitting, Cuff Size: Normal)   Pulse 90   Temp 98.4 F (36.9 C) (Other (Comment))   Ht 5\' 2"  (1.575 m)   Wt 145 lb (65.8 kg)   SpO2 98%   BMI 26.52 kg/m    Subjective:    Patient ID: Lisa Jenkins, female    DOB: March 11, 1951, 66 y.o.   MRN: [id number]  HPI: Lisa Jenkins is a 67 y.o. female presenting on 05/10/2017 for Diabetes and Hyperlipidemia   HPI  Pt is trying really hard to be compliant.  She ran out of her amlodipine several days ago.   she has no complaints.    Relevant past medical, surgical, family and social history reviewed and updated as indicated. Interim medical history since our last visit reviewed. Allergies and medications reviewed and updated.   Current Outpatient Prescriptions:  .  aspirin 81 MG chewable tablet, Chew 81 mg by mouth daily. , Disp: , Rfl:  .  atorvastatin (LIPITOR) 20 MG tablet, Take 1 tablet (20 mg total) by mouth daily., Disp: 30 tablet, Rfl: 2 .  insulin glargine (LANTUS) 100 UNIT/ML injection, Inject 20 Units into the skin at bedtime. , Disp: , Rfl:  .  levothyroxine (SYNTHROID, LEVOTHROID) 25 MCG tablet, Take 1 tablet (25 mcg total) by mouth daily before breakfast., Disp: 30 tablet, Rfl: 2 .  lisinopril (PRINIVIL,ZESTRIL) 20 MG tablet, Take 2 tablets (40 mg total) by mouth daily., Disp: 60 tablet, Rfl: 1 .  metFORMIN (GLUCOPHAGE) 1000 MG tablet, Take 1 tablet (1,000 mg total) by mouth 2 (two) times daily with a meal., Disp: 60 tablet, Rfl: 2 .  amLODipine (NORVASC) 5 MG tablet, Take 1 tablet (5 mg total) by mouth daily. (Patient not taking: Reported on 05/10/2017), Disp: 30 tablet, Rfl: 1   Review of Systems  Constitutional: Negative for appetite change, chills, diaphoresis, fatigue, fever and unexpected weight change.  HENT: Negative for congestion, dental problem, drooling, ear pain, facial swelling, hearing loss, mouth sores, sneezing, sore throat, trouble swallowing and voice change.    Eyes: Negative for pain, discharge, redness, itching and visual disturbance.  Respiratory: Negative for cough, choking, shortness of breath and wheezing.   Cardiovascular: Negative for chest pain, palpitations and leg swelling.  Gastrointestinal: Negative for abdominal pain, blood in stool, constipation, diarrhea and vomiting.  Endocrine: Negative for cold intolerance, heat intolerance and polydipsia.  Genitourinary: Negative for decreased urine volume, dysuria and hematuria.  Musculoskeletal: Negative for arthralgias, back pain and gait problem.  Skin: Negative for rash.  Allergic/Immunologic: Negative for environmental allergies.  Neurological: Negative for seizures, syncope, light-headedness and headaches.  Hematological: Negative for adenopathy.  Psychiatric/Behavioral: Negative for agitation, dysphoric mood and suicidal ideas. The patient is not nervous/anxious.     Per HPI unless specifically indicated above     Objective:    BP 130/74 (BP Location: Left Arm, Patient Position: Sitting, Cuff Size: Normal)   Pulse 90   Temp 98.4 F (36.9 C) (Other (Comment))   Ht 5\' 2"  (1.575 m)   Wt 145 lb (65.8 kg)   SpO2 98%   BMI 26.52 kg/m   Wt Readings from Last 3 Encounters:  05/10/17 145 lb (65.8 kg)  02/08/17 143 lb 8 oz (65.1 kg)  01/08/17 141 lb (64 kg)    Physical Exam  Constitutional: She is oriented to person, place, and time. She appears well-developed and well-nourished.  HENT:  Head: Normocephalic and atraumatic.  Neck: Neck supple.  Cardiovascular: Normal rate and regular rhythm.   Pulmonary/Chest: Effort normal and breath sounds normal.  Abdominal: Soft. Bowel sounds are normal. She exhibits no mass. There is no hepatosplenomegaly. There is no tenderness.  Musculoskeletal: She exhibits no edema.  Lymphadenopathy:    She has no cervical adenopathy.  Neurological: She is alert and oriented to person, place, and time.  Skin: Skin is warm and dry.  Psychiatric: She  has a normal mood and affect. Her behavior is normal.  Vitals reviewed.      Results for orders placed or performed during the hospital encounter of 05/04/17  Lipid Profile  Result Value Ref Range   Cholesterol 165 0 - 200 mg/dL   Triglycerides 125 <150 mg/dL   HDL 60 >40 mg/dL   Total CHOL/HDL Ratio 2.8 RATIO   VLDL 25 0 - 40 mg/dL   LDL Cholesterol 80 0 - 99 mg/dL  Hemoglobin A1C  Result Value Ref Range   Hgb A1c MFr Bld 6.6 (H) 4.8 - 5.6 %   Mean Plasma Glucose 142.72 mg/dL  TSH  Result Value Ref Range   TSH 4.429 0.350 - 4.500 uIU/mL  Comprehensive metabolic panel  Result Value Ref Range   Sodium 134 (L) 135 - 145 mmol/L   Potassium 3.8 3.5 - 5.1 mmol/L   Chloride 98 (L) 101 - 111 mmol/L   CO2 27 22 - 32 mmol/L   Glucose, Bld 319 (H) 65 - 99 mg/dL   BUN 11 6 - 20 mg/dL   Creatinine, Ser 0.73 0.44 - 1.00 mg/dL   Calcium 9.2 8.9 - 10.3 mg/dL   Total Protein 7.4 6.5 - 8.1 g/dL   Albumin 3.5 3.5 - 5.0 g/dL   AST 11 (L) 15 - 41 U/L   ALT 7 (L) 14 - 54 U/L   Alkaline Phosphatase 92 38 - 126 U/L   Total Bilirubin 0.4 0.3 - 1.2 mg/dL   GFR calc non Af Amer >60 >60 mL/min   GFR calc Af Amer >60 >60 mL/min   Anion gap 9 5 - 15  Microalbumin, urine  Result Value Ref Range   Microalb, Ur 4,294.4 (H) Not Estab. ug/mL      Assessment & Plan:   Encounter Diagnoses  Name Primary?  . Type 2 diabetes mellitus with retinopathy, macular edema presence unspecified, unspecified laterality, unspecified retinopathy severity, unspecified whether long term insulin use ([place]) Yes  . Essential hypertension, benign   . Hyperlipidemia, unspecified hyperlipidemia type   . Hypothyroidism, unspecified type   . Cigarette nicotine dependence, uncomplicated   . Proteinuria, unspecified type   . Screening for breast cancer      -reviewed labs with pt -Refilled amlodipine.  Continue other meds.  No changes -will order screening Mammogram for October -will order annual Dm eye exam -pt to  follow up in 3 months.  RTO sooner prn

## 2017-06-04 ENCOUNTER — Ambulatory Visit: Payer: Self-pay | Admitting: Physician Assistant

## 2017-07-08 ENCOUNTER — Inpatient Hospital Stay (HOSPITAL_COMMUNITY)
Admission: EM | Admit: 2017-07-08 | Discharge: 2017-07-10 | DRG: 246 | Disposition: A | Discharge status: Home / Self Care | Payer: Medicaid Other | Attending: Internal Medicine | Admitting: Internal Medicine

## 2017-07-08 ENCOUNTER — Encounter (HOSPITAL_COMMUNITY): Payer: Self-pay | Admitting: Emergency Medicine

## 2017-07-08 ENCOUNTER — Emergency Department (HOSPITAL_COMMUNITY): Payer: Medicaid Other

## 2017-07-08 ENCOUNTER — Other Ambulatory Visit: Payer: Self-pay

## 2017-07-08 DIAGNOSIS — E785 Hyperlipidemia, unspecified: Secondary | ICD-10-CM

## 2017-07-08 DIAGNOSIS — J181 Lobar pneumonia, unspecified organism: Secondary | ICD-10-CM

## 2017-07-08 DIAGNOSIS — E119 Type 2 diabetes mellitus without complications: Secondary | ICD-10-CM | POA: Diagnosis present

## 2017-07-08 DIAGNOSIS — I25110 Atherosclerotic heart disease of native coronary artery with unstable angina pectoris: Secondary | ICD-10-CM | POA: Diagnosis not present

## 2017-07-08 DIAGNOSIS — Z955 Presence of coronary angioplasty implant and graft: Secondary | ICD-10-CM

## 2017-07-08 DIAGNOSIS — I214 Non-ST elevation (NSTEMI) myocardial infarction: Secondary | ICD-10-CM | POA: Diagnosis present

## 2017-07-08 DIAGNOSIS — E039 Hypothyroidism, unspecified: Secondary | ICD-10-CM | POA: Diagnosis present

## 2017-07-08 DIAGNOSIS — D649 Anemia, unspecified: Secondary | ICD-10-CM | POA: Diagnosis present

## 2017-07-08 DIAGNOSIS — Z79899 Other long term (current) drug therapy: Secondary | ICD-10-CM | POA: Diagnosis not present

## 2017-07-08 DIAGNOSIS — E7800 Pure hypercholesterolemia, unspecified: Secondary | ICD-10-CM | POA: Diagnosis present

## 2017-07-08 DIAGNOSIS — R079 Chest pain, unspecified: Secondary | ICD-10-CM | POA: Diagnosis present

## 2017-07-08 DIAGNOSIS — Z8249 Family history of ischemic heart disease and other diseases of the circulatory system: Secondary | ICD-10-CM

## 2017-07-08 DIAGNOSIS — Z794 Long term (current) use of insulin: Secondary | ICD-10-CM | POA: Diagnosis not present

## 2017-07-08 DIAGNOSIS — J189 Pneumonia, unspecified organism: Secondary | ICD-10-CM | POA: Insufficient documentation

## 2017-07-08 DIAGNOSIS — F17210 Nicotine dependence, cigarettes, uncomplicated: Secondary | ICD-10-CM | POA: Diagnosis present

## 2017-07-08 DIAGNOSIS — Z7982 Long term (current) use of aspirin: Secondary | ICD-10-CM

## 2017-07-08 DIAGNOSIS — I10 Essential (primary) hypertension: Secondary | ICD-10-CM | POA: Diagnosis present

## 2017-07-08 DIAGNOSIS — E11319 Type 2 diabetes mellitus with unspecified diabetic retinopathy without macular edema: Secondary | ICD-10-CM | POA: Diagnosis present

## 2017-07-08 LAB — CBC
HCT: 34.2 % — ABNORMAL LOW (ref 36.0–46.0)
HCT: 34.4 % — ABNORMAL LOW (ref 36.0–46.0)
Hemoglobin: 11.3 g/dL — ABNORMAL LOW (ref 12.0–15.0)
Hemoglobin: 11.5 g/dL — ABNORMAL LOW (ref 12.0–15.0)
MCH: 31.2 pg (ref 26.0–34.0)
MCH: 31.3 pg (ref 26.0–34.0)
MCHC: 33 g/dL (ref 30.0–36.0)
MCHC: 33.4 g/dL (ref 30.0–36.0)
MCV: 94.5 fL (ref 78.0–100.0)
MCV: 93.7 fL (ref 78.0–100.0)
PLATELETS: 263 10*3/uL (ref 150–400)
Platelets: 274 10*3/uL (ref 150–400)
RBC: 3.62 MIL/uL — AB (ref 3.87–5.11)
RBC: 3.67 MIL/uL — AB (ref 3.87–5.11)
RDW: 13.2 % (ref 11.5–15.5)
RDW: 13.2 % (ref 11.5–15.5)
WBC: 9 10*3/uL (ref 4.0–10.5)
WBC: 9.9 10*3/uL (ref 4.0–10.5)

## 2017-07-08 LAB — PROTIME-INR
INR: 0.89
Prothrombin Time: 12 seconds (ref 11.4–15.2)

## 2017-07-08 LAB — BASIC METABOLIC PANEL
ANION GAP: 5 (ref 5–15)
ANION GAP: 11 (ref 5–15)
BUN: 13 mg/dL (ref 6–20)
BUN: 13 mg/dL (ref 6–20)
CALCIUM: 8.6 mg/dL — AB (ref 8.9–10.3)
CALCIUM: 8.7 mg/dL — AB (ref 8.9–10.3)
CO2: 24 mmol/L (ref 22–32)
CO2: 22 mmol/L (ref 22–32)
CREATININE: 0.83 mg/dL (ref 0.44–1.00)
CREATININE: 0.73 mg/dL (ref 0.44–1.00)
Chloride: 106 mmol/L (ref 101–111)
Chloride: 104 mmol/L (ref 101–111)
GFR calc non Af Amer: >60 mL/min (ref >60)
Glucose, Bld: 312 mg/dL — ABNORMAL HIGH (ref 65–99)
Glucose, Bld: 207 mg/dL — ABNORMAL HIGH (ref 65–99)
Potassium: 4.2 mmol/L (ref 3.5–5.1)
Potassium: 4.1 mmol/L (ref 3.5–5.1)
SODIUM: 137 mmol/L (ref 135–145)
Sodium: 135 mmol/L (ref 135–145)

## 2017-07-08 LAB — GLUCOSE, CAPILLARY
GLUCOSE-CAPILLARY: 166 mg/dL — AB (ref 65–99)
GLUCOSE-CAPILLARY: 164 mg/dL — AB (ref 65–99)
GLUCOSE-CAPILLARY: 328 mg/dL — AB (ref 65–99)
Glucose-Capillary: 130 mg/dL — ABNORMAL HIGH (ref 65–99)

## 2017-07-08 LAB — HEPARIN LEVEL (UNFRACTIONATED)
HEPARIN UNFRACTIONATED: 0.4 [IU]/mL (ref 0.30–0.70)
Heparin Unfractionated: <0.1 IU/mL — ABNORMAL LOW (ref 0.30–0.70)

## 2017-07-08 LAB — APTT: APTT: 37 s — AB (ref 24–36)

## 2017-07-08 LAB — CBG MONITORING, ED: Glucose-Capillary: 235 mg/dL — ABNORMAL HIGH (ref 65–99)

## 2017-07-08 LAB — PROCALCITONIN: Procalcitonin: <0.1 ng/mL

## 2017-07-08 LAB — MRSA PCR SCREENING: MRSA by PCR: NEGATIVE

## 2017-07-08 LAB — TROPONIN I
TROPONIN I: 0.57 ng/mL — AB (ref <0.03)
Troponin I: 0.73 ng/mL (ref <0.03)

## 2017-07-08 LAB — STREP PNEUMONIAE URINARY ANTIGEN: Strep Pneumo Urinary Antigen: NEGATIVE

## 2017-07-08 MED ORDER — INSULIN GLARGINE 100 UNIT/ML ~~LOC~~ SOLN
8.0000 [IU] | Freq: Every day | SUBCUTANEOUS | Status: DC
  Administered 2017-07-09: 8 [IU] via SUBCUTANEOUS
  Filled 2017-07-08 (×2): qty 0.08

## 2017-07-08 MED ORDER — GI COCKTAIL ~~LOC~~
ORAL | Status: AC
  Filled 2017-07-08: qty 30

## 2017-07-08 MED ORDER — ASPIRIN 81 MG PO CHEW
81.0000 mg | CHEWABLE_TABLET | Freq: Every day | ORAL | Status: DC
  Administered 2017-07-08: 81 mg via ORAL
  Filled 2017-07-08 (×2): qty 1

## 2017-07-08 MED ORDER — ASPIRIN 81 MG PO CHEW
CHEWABLE_TABLET | ORAL | Status: AC
  Filled 2017-07-08: qty 1

## 2017-07-08 MED ORDER — INSULIN ASPART 100 UNIT/ML ~~LOC~~ SOLN
0.0000 [IU] | SUBCUTANEOUS | Status: DC
  Administered 2017-07-08: 2 [IU] via SUBCUTANEOUS
  Administered 2017-07-08: 1 [IU] via SUBCUTANEOUS
  Administered 2017-07-08: 2 [IU] via SUBCUTANEOUS
  Administered 2017-07-08: 7 [IU] via SUBCUTANEOUS
  Administered 2017-07-09: 2 [IU] via SUBCUTANEOUS
  Administered 2017-07-09: 3 [IU] via SUBCUTANEOUS
  Administered 2017-07-09: 12:00:00 2 [IU] via SUBCUTANEOUS
  Administered 2017-07-09 – 2017-07-10 (×2): 3 [IU] via SUBCUTANEOUS

## 2017-07-08 MED ORDER — ALPRAZOLAM 0.25 MG PO TABS
0.2500 mg | ORAL_TABLET | Freq: Two times a day (BID) | ORAL | Status: DC | PRN
Start: 2017-07-08 — End: 2017-07-10

## 2017-07-08 MED ORDER — ASPIRIN 300 MG RE SUPP: 300.0000 mg | RECTAL | Status: AC

## 2017-07-08 MED ORDER — DEXTROSE 5 % IV SOLN
1.0000 g | INTRAVENOUS | Status: DC
  Administered 2017-07-09 – 2017-07-10 (×2): 1 g via INTRAVENOUS
  Filled 2017-07-08 (×2): qty 10

## 2017-07-08 MED ORDER — DEXTROSE 5 % IV SOLN
1.0000 g | Freq: Once | INTRAVENOUS | Status: AC
  Administered 2017-07-08: 1 g via INTRAVENOUS
  Filled 2017-07-08: qty 10

## 2017-07-08 MED ORDER — HEPARIN BOLUS VIA INFUSION
3500.0000 [IU] | Freq: Once | INTRAVENOUS | Status: AC
  Administered 2017-07-08: 3500 [IU] via INTRAVENOUS

## 2017-07-08 MED ORDER — HEPARIN BOLUS VIA INFUSION
1500.0000 [IU] | Freq: Once | INTRAVENOUS | Status: AC
  Administered 2017-07-08: 1500 [IU] via INTRAVENOUS
  Filled 2017-07-08: qty 1500

## 2017-07-08 MED ORDER — ASPIRIN 81 MG PO CHEW: 324.0000 mg | CHEWABLE_TABLET | ORAL | Status: AC

## 2017-07-08 MED ORDER — ATORVASTATIN CALCIUM 80 MG PO TABS
80.0000 mg | ORAL_TABLET | Freq: Every day | ORAL | Status: DC
  Administered 2017-07-08 – 2017-07-09 (×2): 80 mg via ORAL
  Filled 2017-07-08 (×2): qty 1

## 2017-07-08 MED ORDER — MORPHINE SULFATE (PF) 2 MG/ML IV SOLN: 2.0000 mg | INTRAVENOUS | Status: DC | PRN

## 2017-07-08 MED ORDER — LEVOTHYROXINE SODIUM 25 MCG PO TABS
25.0000 ug | ORAL_TABLET | Freq: Every day | ORAL | Status: DC
  Administered 2017-07-08 – 2017-07-10 (×3): 25 ug via ORAL
  Filled 2017-07-08 (×3): qty 1

## 2017-07-08 MED ORDER — DEXTROSE 5 % IV SOLN
500.0000 mg | INTRAVENOUS | Status: DC
  Administered 2017-07-08 – 2017-07-10 (×3): 500 mg via INTRAVENOUS
  Filled 2017-07-08 (×4): qty 500

## 2017-07-08 MED ORDER — METOPROLOL TARTRATE 12.5 MG HALF TABLET
12.5000 mg | ORAL_TABLET | Freq: Two times a day (BID) | ORAL | Status: DC
  Administered 2017-07-08: 12.5 mg via ORAL
  Filled 2017-07-08: qty 1

## 2017-07-08 MED ORDER — PANTOPRAZOLE SODIUM 40 MG PO TBEC
DELAYED_RELEASE_TABLET | ORAL | Status: AC
  Filled 2017-07-08: qty 1

## 2017-07-08 MED ORDER — HEPARIN (PORCINE) IN NACL 100-0.45 UNIT/ML-% IJ SOLN
950.0000 [IU]/h | INTRAMUSCULAR | Status: DC
  Administered 2017-07-08: 750 [IU]/h via INTRAVENOUS
  Filled 2017-07-08 (×2): qty 250

## 2017-07-08 MED ORDER — NITROGLYCERIN 0.4 MG SL SUBL: 0.4000 mg | SUBLINGUAL_TABLET | SUBLINGUAL | Status: DC | PRN

## 2017-07-08 MED ORDER — LISINOPRIL 40 MG PO TABS
40.0000 mg | ORAL_TABLET | Freq: Every day | ORAL | Status: DC
  Administered 2017-07-08 – 2017-07-10 (×3): 40 mg via ORAL
  Filled 2017-07-08: qty 1
  Filled 2017-07-08: qty 4
  Filled 2017-07-08: qty 1

## 2017-07-08 MED ORDER — ATORVASTATIN CALCIUM 20 MG PO TABS: 20.0000 mg | ORAL_TABLET | Freq: Every day | ORAL | Status: DC

## 2017-07-08 MED ORDER — ACETAMINOPHEN 325 MG PO TABS: 650.0000 mg | ORAL_TABLET | ORAL | Status: DC | PRN

## 2017-07-08 MED ORDER — ONDANSETRON HCL 4 MG/2ML IJ SOLN: 4.0000 mg | Freq: Four times a day (QID) | INTRAMUSCULAR | Status: DC | PRN

## 2017-07-08 NOTE — Consult Note (Signed)
Cardiology Consultation:   Patient ID: FUMIYE LUBBEN; [id number]; [birth year]   Admit date: 07/08/2017 Date of Consult: 07/08/2017  Primary Care Provider: Soyla Dryer, PA-C Primary Cardiologist: New    Patient Profile:   ALISANDRA SON is a 66 y.o. 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 Ree Kida.  History of Present Illness:   Ms. Giorgio Has no personal history of cardiac issues. On Friday night after she laid down to sleep she developed central chest pressure that lasted for about 20 minutes. It was not associated with any dyspnea, palpitations, diaphoresis and did not radiate. She says it felt like indigestion and she felt like she needed to burp but couldn't. She was eventually able to go to sleep. On Saturday she had no pain during the day. She was able to work in the yard and take a shower without any discomfort. Again after she laid down in the bed she developed the same pain and had some mild nausea. It continued for about 30 minutes and her family encouraged her to call EMS. She was instructed to take 4 asa 81 mg and this eased her pain. The pain finally resolved while she was in the ED.   Prior to this she denies any exertional chest discomfort or dyspnea. She denies recent edema. She used to have edema but not since she reduced her salt intake. She denies orthopnea. She continues to smoke 1/2 PPD and denies alcohol intake. She reports a strong family history of CAD with 5 brothers, all deceased, having "heart problems". Her sister is living and has had bypass surgery per the pt.   The patient is hemodynamically stable. Being treated for CAP based on chest xray. No fever or leukocytosis.    Significant findings: Troponins 0.57, 0.73 EKG NSR 72 bpm with new non-specific T changes in inferior and lateral leads. K+  4.2,   SCr  0.73 Hgb 11.5,   WBC 9.9 CXR: No pulmonary edema, Streaky right upper lobe opacities suspicious for pneumonia. Followup PA and lateral chest X-ray is recommended in 3-4 weeks following trial of antibiotic therapy to ensure resolution and exclude underlying malignancy.    Past Medical History:  Diagnosis Date  . Diabetes mellitus without complication ([place])   . High cholesterol   . Hypertension     Past Surgical History:  Procedure Laterality Date  . BUNIONECTOMY Bilateral      Home Medications:  Prior to Admission medications   Medication Sig Start Date End Date Taking? Authorizing Provider  amLODipine (NORVASC) 5 MG tablet Take 1 tablet (5 mg total) by mouth daily. 05/10/17   Soyla Dryer, PA-C  aspirin 81 MG chewable tablet Chew 81 mg by mouth daily.     [provider]  atorvastatin (LIPITOR) 20 MG tablet Take 1 tablet (20 mg total) by mouth daily. 05/10/17   Soyla Dryer, PA-C  insulin glargine (LANTUS) 100 UNIT/ML injection Inject 20 Units into the skin at bedtime.     [provider]  levothyroxine (SYNTHROID, LEVOTHROID) 25 MCG tablet Take 1 tablet (25 mcg total) by mouth daily before breakfast. 05/10/17   Soyla Dryer, PA-C  lisinopril (PRINIVIL,ZESTRIL) 20 MG tablet Take 2 tablets (40 mg total) by mouth daily. 05/10/17   Soyla Dryer, PA-C  metFORMIN (GLUCOPHAGE) 1000 MG tablet Take 1 tablet (1,000  mg total) by mouth 2 (two) times daily with a meal. 03/12/17   Soyla Dryer, PA-C    Inpatient Medications: Scheduled Meds: . aspirin      . aspirin  324 mg Oral NOW   Or  . aspirin  300 mg Rectal NOW  . aspirin  81 mg Oral Daily  . atorvastatin  20 mg Oral q1800  . gi cocktail      . insulin aspart  0-9 Units Subcutaneous Q4H  . insulin glargine  8 Units Subcutaneous QHS  . levothyroxine  25 mcg Oral QAC breakfast  . lisinopril  40 mg Oral Daily  . pantoprazole       Continuous Infusions: . azithromycin    . [START ON 07/09/2017]  cefTRIAXone (ROCEPHIN)  IV    . heparin 750 Units/hr (07/08/17 0600)   PRN Meds: acetaminophen, ALPRAZolam, morphine injection, nitroGLYCERIN, ondansetron (ZOFRAN) IV  Allergies:   No Known Allergies  Social History:   Social History   Socioeconomic History  . Marital status: Single    Spouse name: Not on file  . Number of children: Not on file  . Years of education: Not on file  . Highest education level: Not on file  Social Needs  . Financial resource strain: Not on file  . Food insecurity - worry: Not on file  . Food insecurity - inability: Not on file  . Transportation needs - medical: Not on file  . Transportation needs - non-medical: Not on file  Occupational History  . Not on file  Tobacco Use  . Smoking status: Current Every Day Smoker    Packs/day: 0.50    Years: 46.00    Pack years: 23.00  . Smokeless tobacco: Never Used  Substance and Sexual Activity  . Alcohol use: No    Alcohol/week: 0.0 oz  . Drug use: No  . Sexual activity: Not on file  Other Topics Concern  . Not on file  Social History Narrative  . Not on file    Family History:    Family History  Problem Relation Age of Onset  . Diabetes Mother   . Heart disease Mother   . Heart disease Father   . Diabetes Sister   . Heart disease Sister   . Diabetes Brother   . Heart disease Brother   . Diabetes Brother   . Heart disease Brother   . Diabetes Brother   . Heart disease Brother   . Diabetes Brother   . Heart disease Brother   . Diabetes Brother   . Heart disease Brother      ROS:  Please see the history of present illness.  ROS  All other ROS reviewed and negative.     Physical Exam/Data:   Vitals:   07/08/17 0446 07/08/17 0456 07/08/17 0600 07/08/17 0622  BP:  132/63 (!) 144/62   Pulse: 68 72 62 61  Resp: 19 (!) 22 17 18   Temp:   97.9 F (36.6 C)   TempSrc:   Oral   SpO2: 100% 98% 99% 100%  Weight:      Height:        Intake/Output Summary (Last 24 hours) at 07/08/2017  0751 Last data filed at 07/08/2017 0600 Gross per 24 hour  Intake 7.5 ml  Output -  Net 7.5 ml   Filed Weights   07/08/17 0142  Weight: 145 lb (65.8 kg)   Body mass index is 25.69 kg/m.  General:  Well nourished, well developed, in no  acute distress HEENT: normal Lymph: no adenopathy Neck: no JVD Endocrine:  No thryomegaly Vascular: No carotid bruits; FA pulses 2+ bilaterally without bruits  Cardiac:  normal S1, S2; RRR; no murmur  Lungs:  clear to auscultation bilaterally, no wheezing, rhonchi or rales  Abd: soft, nontender, no hepatomegaly  Ext: no edema Musculoskeletal:  No deformities, BUE and BLE strength normal and equal Skin: warm and dry  Neuro:  CNs 2-12 intact, no focal abnormalities noted Psych:  Normal affect   EKG:  The EKG was personally reviewed and demonstrates:  NSR 72 bpm with new non-specific T changes in inferior and lateral leads. Telemetry:  Telemetry was personally reviewed and demonstrates:  Sinus rhythm in the 60's  Relevant CV Studies:  None  Laboratory Data:  Chemistry Recent Labs  Lab 07/08/17 0115 07/08/17 0456  NA 135 137  K 4.2 4.1  CL 106 104  CO2 24 22  GLUCOSE 312* 207*  BUN 13 13  CREATININE 0.83 0.73  CALCIUM 8.6* 8.7*  GFRNONAA >60 >60  GFRAA >60 >60  ANIONGAP 5 11    No results for input(s): PROT, ALBUMIN, AST, ALT, ALKPHOS, BILITOT in the last 168 hours. Hematology Recent Labs  Lab 07/08/17 0115 07/08/17 0456  WBC 9.0 9.9  RBC 3.62* 3.67*  HGB 11.3* 11.5*  HCT 34.2* 34.4*  MCV 94.5 93.7  MCH 31.2 31.3  MCHC 33.0 33.4  RDW 13.2 13.2  PLT 263 274   Cardiac Enzymes Recent Labs  Lab 07/08/17 0115 07/08/17 0456  TROPONINI 0.57* 0.73*   No results for input(s): TROPIPOC in the last 168 hours.  BNPNo results for input(s): BNP, PROBNP in the last 168 hours.  DDimer No results for input(s): DDIMER in the last 168 hours.  Radiology/Studies:  Dg Chest 2 View  Result Date: 07/08/2017 CLINICAL DATA:  Chest  pain. EXAM: CHEST  2 VIEW COMPARISON:  Chest CTA 07/28/2016 FINDINGS: Streaky right upper lobe opacities suspicious for pneumonia. Normal heart size and mediastinal contours. No pulmonary edema, pleural fluid or pneumothorax. No acute osseous abnormalities. IMPRESSION: Streaky right upper lobe opacities suspicious for pneumonia. Followup PA and lateral chest X-ray is recommended in 3-4 weeks following trial of antibiotic therapy to ensure resolution and exclude underlying malignancy. Electronically Signed   By: Jeb Levering M.D.   On: 07/08/2017 03:14    Assessment and Plan:   Non ST elevation MI: No previous hx of CAD. Pt with chest pressure after laying down to sleep for 2 night. No associated symptoms except mild nausea. Felt like indigestion per pt. Troponins are elevated 0.57, 0.73. CVD risk factors include HTN, HLD, DM, Smoking and family history. She was also noted to have advanced calcific atherosclerosis of the coronary arteries noted on CTA in 07/2016. EKG shows non-specific T wave changes which are new compared to 2017. Although her symptoms are somewhat atypical, it is likely that with her risk factors, she does have underlying CAD. She is being treated for CAP and this may contributing to her elevated troponin. Currently chest pain free. Continue to trend troponins and monitor symptoms. Continuing on asa, statin and heparin infusion. Check echo for wall motion and LV function. Will possibly need cardiac cath for further definitive cardiac evaluation.   Hypertension: BP currently stable.   Hyperlipidemia:  LDL 80 on 05/04/2017. Continue atorvastatin  Type 2 DM on insulin: Last Hgb A1c 6.6 in 04/2017. Management per IM. On SSI.   Tobacco use: Pt smokes 1/2 PPD. Counseled on smoking cessation  especially with her strong famliy hx of CAD. She already uses nicotine gum when she runs out of cigarettes.    For questions or updates, please contact [place] Please consult [email or web address]  for contact info under Cardiology/STEMI.   Signed, Daune Perch, NP  07/08/2017 7:51 AM As above, patient seen and examined. Briefly she is a 66 year old female with past medical history of diabetes mellitus, hypertension, hyperlipidemia, tobacco abuse, family history of coronary disease with non-ST elevation myocardial infarction. Patient developed substernal "indigestion" Friday evening for 20 minutes. The pain did not radiate. No associated symptoms. Not pleuritic, positional or related to food. No associated water brash. Resolved spontaneously. She had recurrent symptoms last evening that resolved after she presented to [facility]. She was transferred to [facility] [address] and is presently pain-free. Cardiology asked to evaluate. Troponin 0.73. Electrocardiogram shows sinus rhythm with inferior lateral T-wave inversion.   1 non-ST elevation myocardial infarction-plan to treat with aspirin, heparin, statin and add low-dose beta-blockade. Patient will require cardiac catheterization. The risks and benefits including myocardial infarction, CVA and death discussed and she agrees to proceed. Echocardiogram is pending for LV function.   2 hyperlipidemia-given non-ST elevation myocardial infarction I will increase Lipitor to 80 mg daily. Check lipids and liver in 4 weeks.  3 hypertension-add metoprolol and continue ACE inhibitor. Follow blood pressure and adjust regimen as needed.  4 tobacco abuse-patient counseled on discontinuing.  5 diabetes mellitus-management per primary care.  Kirk Ruths, MD

## 2017-07-08 NOTE — Progress Notes (Signed)
ANTICOAGULATION CONSULT NOTE - Follow Up Consult  Pharmacy Consult for Heparin Indication: NSTEMI  No Known Allergies  Patient Measurements: Height: 5\' 3"  (160 cm) Weight: 145 lb (65.8 kg) IBW/kg (Calculated) : 52.4 Heparin Dosing Weight:    Vital Signs: Temp: 98.2 F (36.8 C) (11/04 1531) Temp Source: Oral (11/04 1531) BP: 140/58 (11/04 1531) Pulse Rate: 63 (11/04 1531)  Labs: Recent Labs    07/08/17 0115 07/08/17 0456 07/08/17 1041 07/08/17 1802  HGB 11.3* 11.5*  --   --   HCT 34.2* 34.4*  --   --   PLT 263 274  --   --   APTT 37*  --   --   --   LABPROT 12.0  --   --   --   INR 0.89  --   --   --   HEPARINUNFRC  --   --  <0.10* 0.40  CREATININE 0.83 0.73  --   --   TROPONINI 0.57* 0.73*  --   --     Estimated Creatinine Clearance: 64.8 mL/min (by C-G formula based on SCr of 0.73 mg/dL).   Assessment: Anticoag: Hep for NSTEMI. HL 0.4 now in goal range.  Goal of Therapy:  Heparin level 0.3-0.7 units/ml Monitor platelets by anticoagulation protocol: Yes   Plan:  -Heparin 950/hr - Daily HL and CBC - f/u cath   Olaoluwa Grieder S. Alford Highland, PharmD, BCPS Clinical Staff Pharmacist Pager [phone]  Eilene Ghazi Stillinger 07/08/2017,7:10 PM

## 2017-07-08 NOTE — ED Provider Notes (Signed)
[facility] EMERGENCY DEPARTMENT Provider Note   CSN: [id number] Arrival date & time: 07/08/17  0141     History   Chief Complaint Chief Complaint  Patient presents with  . Chest Pain    HPI Lisa Jenkins is a 66 y.o. female.  The history is provided by the patient.  She has history of hypertension, diabetes, hyperlipidemia and is a cigarette smoker.  She has been having chest pain throughout the day today.  Pain is episodic-lasting 5-56minutes.  She describes it as a dull, burning pain which she rates at 6/10.  It seems to be worse when she lays down.  It does not seem to be affected by exertion.  There is no associated dyspnea, nausea, diaphoresis.  She tried taking some Tums, but it did not seem to give her any relief.  Cardiac risk factors are as noted above, as well as strong family history of premature coronary atherosclerosis.   Past Medical History:  Diagnosis Date  . Diabetes mellitus without complication ([place])   . High cholesterol   . Hypertension     Patient Active Problem List   Diagnosis Date Noted  . NSTEMI (non-ST elevated myocardial infarction) ([place]) 07/08/2017  . Chest pain 07/28/2016  . 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 Hypothyroidism 10/21/2015  . Uncontrolled type 2 diabetes mellitus with complication ([place]) 45/80/9983  . Type 2 diabetes mellitus with complication ([place]) 38/25/0539  . Hyperlipidemia 07/21/2015  . Essential hypertension, benign 07/21/2015  . Cigarette nicotine dependence, uncomplicated 76/73/4193  . Type 2 diabetes mellitus with retinopathy ([place]) 07/21/2015  . Proteinuria 07/21/2015  . Thyroid activity decreased 07/21/2015    Past Surgical History:  Procedure Laterality Date  . BUNIONECTOMY Bilateral     OB History    Gravida Para Term Preterm AB Living             0   SAB TAB Ectopic Multiple Live Births                   Home Medications    Prior to Admission medications   Medication Sig Start Date End Date Taking?  Authorizing Provider  amLODipine (NORVASC) 5 MG tablet Take 1 tablet (5 mg total) by mouth daily. 05/10/17   Soyla Dryer, PA-C  aspirin 81 MG chewable tablet Chew 81 mg by mouth daily.     [provider]  atorvastatin (LIPITOR) 20 MG tablet Take 1 tablet (20 mg total) by mouth daily. 05/10/17   Soyla Dryer, PA-C  insulin glargine (LANTUS) 100 UNIT/ML injection Inject 20 Units into the skin at bedtime.     [provider]  levothyroxine (SYNTHROID, LEVOTHROID) 25 MCG tablet Take 1 tablet (25 mcg total) by mouth daily before breakfast. 05/10/17   Soyla Dryer, PA-C  lisinopril (PRINIVIL,ZESTRIL) 20 MG tablet Take 2 tablets (40 mg total) by mouth daily. 05/10/17   Soyla Dryer, PA-C  metFORMIN (GLUCOPHAGE) 1000 MG tablet Take 1 tablet (1,000 mg total) by mouth 2 (two) times daily with a meal. 03/12/17   Soyla Dryer, PA-C    Family History Family History  Problem Relation Age of Onset  . Diabetes Mother   . Heart disease Mother   . Heart disease Father   . Diabetes Sister   . Heart disease Sister   . Diabetes Brother   . Heart disease Brother   . Diabetes Brother   . Heart disease Brother   . Diabetes Brother   . Heart disease Brother   .  Diabetes Brother   . Heart disease Brother   . Diabetes Brother   . Heart disease Brother     Social History Social History   Tobacco Use  . Smoking status: Current Every Day Smoker    Packs/day: 0.50    Years: 46.00    Pack years: 23.00  . Smokeless tobacco: Never Used  Substance Use Topics  . Alcohol use: No    Alcohol/week: 0.0 oz  . Drug use: No     Allergies   Patient has no known allergies.   Review of Systems Review of Systems  All other systems reviewed and are negative.    Physical Exam Updated Vital Signs BP (!) 144/62 (BP Location: Left Arm)   Pulse 61   Temp 97.9 F (36.6 C) (Oral)   Resp 18   Ht 5\' 3"  (1.6 m)   Wt 65.8 kg (145 lb)   SpO2 100%   BMI 25.69 kg/m   Physical  Exam  Nursing note and vitals reviewed.   66 year old female, resting comfortably and in no acute distress. Vital signs are significant for borderline hypertension. Oxygen saturation is 100%, which is normal. Head is normocephalic and atraumatic. PERRLA, EOMI. Oropharynx is clear. Neck is nontender and supple without adenopathy or JVD. Back is nontender and there is no CVA tenderness. Lungs are clear without rales, wheezes, or rhonchi. Chest is nontender. Heart has regular rate and rhythm without murmur. Abdomen is soft, flat, nontender without masses or hepatosplenomegaly and peristalsis is normoactive. Extremities have no cyanosis or edema, full range of motion is present. Skin is warm and dry without rash. Neurologic: Mental status is normal, cranial nerves are intact, there are no motor or sensory deficits.  ED Treatments / Results  Labs (all labs ordered are listed, but only abnormal results are displayed) Labs Reviewed  BASIC METABOLIC PANEL - Abnormal; Notable for the following components:      Result Value   Glucose, Bld 312 (*)    Calcium 8.6 (*)    All other components within normal limits  CBC - Abnormal; Notable for the following components:   RBC 3.62 (*)    Hemoglobin 11.3 (*)    HCT 34.2 (*)    All other components within normal limits  TROPONIN I - Abnormal; Notable for the following components:   Troponin I 0.57 (*)    All other components within normal limits    EKG  EKG Interpretation  Date/Time:  Sunday July 08 2017 01:52:06 EDT Ventricular Rate:  72 PR Interval:    QRS Duration: 94 QT Interval:  417 QTC Calculation: 457 R Axis:   18 Text Interpretation:  Sinus rhythm Nonspecific T abnormalities, inferior leads When compared with ECG of 07/28/2016, No significant change was found Confirmed by Delora Fuel ([id number]) on 07/08/2017 3:44:52 AM       Radiology Dg Chest 2 View  Result Date: 07/08/2017 CLINICAL DATA:  Chest pain. EXAM: CHEST  2 VIEW  COMPARISON:  Chest CTA 07/28/2016 FINDINGS: Streaky right upper lobe opacities suspicious for pneumonia. Normal heart size and mediastinal contours. No pulmonary edema, pleural fluid or pneumothorax. No acute osseous abnormalities. IMPRESSION: Streaky right upper lobe opacities suspicious for pneumonia. Followup PA and lateral chest X-ray is recommended in 3-4 weeks following trial of antibiotic therapy to ensure resolution and exclude underlying malignancy. Electronically Signed   By: Jeb Levering M.D.   On: 07/08/2017 03:14    Procedures Procedures (including critical care time) CRITICAL CARE Performed  by: Delora Fuel Total critical care time: 45 minutes Critical care time was exclusive of separately billable procedures and treating other patients. Critical care was necessary to treat or prevent imminent or life-threatening deterioration. Critical care was time spent personally by me on the following activities: development of treatment plan with patient and/or surrogate as well as nursing, discussions with consultants, evaluation of patient's response to treatment, examination of patient, obtaining history from patient or surrogate, ordering and performing treatments and interventions, ordering and review of laboratory studies, ordering and review of radiographic studies, pulse oximetry and re-evaluation of patient's condition.  Medications Ordered in ED Medications  gi cocktail suspension (not administered)  pantoprazole (PROTONIX) 40 MG EC tablet (not administered)  aspirin 81 MG chewable tablet (not administered)  cefTRIAXone (ROCEPHIN) 1 g in dextrose 5 % 50 mL IVPB (not administered)     Initial Impression / Assessment and Plan / ED Course  I have reviewed the triage vital signs and the nursing notes.  Pertinent labs & imaging results that were available during my care of the patient were reviewed by me and considered in my medical decision making (see chart for details).  Chest  pain of uncertain cause.  His story seems suspicious for GI origin.  She is given aspirin and also a GI cocktail and a dose of pantoprazole.  Screening labs are obtained as well as chest x-ray.  Chest x-ray is reported to show possible right upper lobe infiltrate, and she is started on ceftriaxone for this.  Mild anemia is present which is unchanged from baseline.  Troponin has come back elevated at 0.57.  Curiously, patient is feeling much better following GI cocktail and pantoprazole and she has had no further episodes of pain.  She is started on heparin.  Case is discussed with Dr. Myna Hidalgo of [facility] who agrees to admit the patient and transfer her to [facility] where she can be evaluated by cardiology.  Final Clinical Impressions(s) / ED Diagnoses   Final diagnoses:  NSTEMI (non-ST elevated myocardial infarction) [facility])  Community acquired pneumonia of right upper lobe of lung (HCC)  Normochromic normocytic anemia    New Prescriptions This SmartLink is deprecated. Use AVSMEDLIST instead to display the medication list for a patient.   Delora Fuel, MD 82/50/03 [phone]

## 2017-07-08 NOTE — Progress Notes (Signed)
  PROGRESS NOTE    Lisa Jenkins  BWL:[id number] DOB: 1951-03-14 DOA: 07/08/2017 PCP: Soyla Dryer, PA-C   Chief Complaint  Patient presents with  . Chest Pain    Brief Narrative:  HPI On 07/08/2017 by Dr. Christia Reading Opyd Lisa Jenkins is a 66 y.o. female with medical history significant for hypertension, insulin-dependent diabetes mellitus, hyperlipidemia, tobacco abuse, and family history of premature CAD, now presenting to the emergency department with 1 day of intermittent chest pain.  Patient reports that she has had intermittent episodes of localized, burning, moderate to severe chest pain, lasting 5-10 minutes at a time, worse with lying down, and with no other alleviating or exacerbating factors identified.  The patient took Tums for her symptoms, but with no relief.  Also reports a cough for the past week, but denies fevers or chills, and denies any significant dyspnea.  Assessment & Plan   NSTEMI -Presented with intermittent chest pain, atypical in nature -elevated troponin: 0.57, 0.73 -Patient does have significant risk factors: HTN, HLD, DM, smoking, family history -Cardiology consulted and appreciated -pending echocardiogram and possible cath on 07/09/2017  IDDM -Hemoglobin A1c was 6.6 in August 2018 -Continue lantus, ISS, and CBG monitoring   Hypertension -Norvasc held -Continue lisinopril  Hypothyroidism -Continue Synthroid -TSH 4.429 in August 2018  Hyperlipidemia -Statin increased, will need repeat lipid panel and CMP in 4 weeks  CAP -Reported cough last week, denied fever or shortness of breath -Chest x-ray shows pneumonia in the right upper lobe -Currently afebrile with no leukocytosis, no hypoxia -On azithromycin and Rocephin -Procalcitonin <0.10  DVT Prophylaxis  heparin  Code Status: Full  Family Communication: None at bedside  Disposition Plan: Admitted, pending further workup  Consultants Cardiology  Procedures  None   LOS: 0 days   Time  Spent in minutes   30 minutes  Kade Demicco D.O. on 07/08/2017 at 3:37 PM  Between 7am to 7pm - Pager - [phone]  After 7pm go to [email or web address] - password [email or web address]  And look for the night coverage person covering for me after hours  [facility] Office  [phone]

## 2017-07-08 NOTE — Progress Notes (Signed)
ANTICOAGULATION CONSULT NOTE - Preliminary  Pharmacy Consult for Heparin Indication: Non-STEMI  No Known Allergies  Patient Measurements: Height: 5\' 3"  (160 cm) Weight: 145 lb (65.8 kg) IBW/kg (Calculated) : 52.4 HEPARIN DW (KG): 65.6   Vital Signs:    Labs: Recent Labs    07/08/17 0115  HGB 11.3*  HCT 34.2*  PLT 263  CREATININE 0.83  TROPONINI 0.57*   Estimated Creatinine Clearance: 62.5 mL/min (by C-G formula based on SCr of 0.83 mg/dL).  Medical History: Past Medical History:  Diagnosis Date  . Diabetes mellitus without complication ([place])   . High cholesterol   . Hypertension     Medications:  Aspirin 324 mg x 1 dose in the ED  Assessment: 66 yo female seen in the ED for complaints of intermittent chest pain since last night (07/07/17). Troponin is elevated. Pharmacy has been consulted for IV heparin dosing.  Goal of Therapy:  Heparin level goal: 0.3-0.7 units/ml Monitor platelets by anticoagulation protocol: Yes   Plan:  Heparin 3500 unit IV bolus Heparin infusion at 750 unit/hr Heparin level in 6-8 hours  Preliminary review of pertinent patient information completed.  Forestine Na clinical pharmacist will complete review during morning rounds to assess the patient and finalize treatment regimen.  Norberto Sorenson, [facility] 07/08/2017,3:52 AM

## 2017-07-08 NOTE — H&P (Signed)
History and Physical    SEDA KRONBERG HAL:[id number] DOB: Feb 25, 1951 DOA: 07/08/2017  PCP: Soyla Dryer, PA-C   Patient coming from: Home  Chief Complaint: Chest pain   HPI: Lisa Jenkins is a 66 y.o. female with medical history significant for hypertension, insulin-dependent diabetes mellitus, hyperlipidemia, tobacco abuse, and family history of premature CAD, now presenting to the emergency department with 1 day of intermittent chest pain.  Patient reports that she has had intermittent episodes of localized, burning, moderate to severe chest pain, lasting 5-10 minutes at a time, worse with lying down, and with no other alleviating or exacerbating factors identified.  The patient took Tums for her symptoms, but with no relief.  Also reports a cough for the past week, but denies fevers or chills, and denies any significant dyspnea.  ED Course: Upon arrival to the ED, patient is found to be saturating well on room air, and with vital signs stable.  EKG features a sinus rhythm with nonspecific T wave abnormalities in the inferior leads, similar to prior.  Chest x-ray is notable for streaky right upper lobe opacities concerning for pneumonia.  Chemistry panel is notable for glucose of 312.  CBC features a normocytic anemia with hemoglobin of 11.3.  Troponin is elevated to a value of 0.57.  The patient was treated with Rocephin and started on heparin infusion.  She remained hemodynamically stable and has not been in any apparent respiratory distress in the ED.  She will be admitted to the stepdown unit at [facility] for ongoing evaluation and management of non-STEMI.  Review of Systems:  All other systems reviewed and apart from HPI, are negative.  Past Medical History:  Diagnosis Date  . Diabetes mellitus without complication ([place])   . High cholesterol   . Hypertension     Past Surgical History:  Procedure Laterality Date  . BUNIONECTOMY Bilateral      reports that she has been  smoking.  She has a 23.00 pack-year smoking history. she has never used smokeless tobacco. She reports that she does not drink alcohol or use drugs.  No Known Allergies  Family History  Problem Relation Age of Onset  . Diabetes Mother   . Heart disease Mother   . Heart disease Father   . Diabetes Sister   . Heart disease Sister   . Diabetes Brother   . Heart disease Brother   . Diabetes Brother   . Heart disease Brother   . Diabetes Brother   . Heart disease Brother   . Diabetes Brother   . Heart disease Brother   . Diabetes Brother   . Heart disease Brother      Prior to Admission medications   Medication Sig Start Date End Date Taking? Authorizing Provider  amLODipine (NORVASC) 5 MG tablet Take 1 tablet (5 mg total) by mouth daily. 05/10/17   Soyla Dryer, PA-C  aspirin 81 MG chewable tablet Chew 81 mg by mouth daily.     [provider]  atorvastatin (LIPITOR) 20 MG tablet Take 1 tablet (20 mg total) by mouth daily. 05/10/17   Soyla Dryer, PA-C  insulin glargine (LANTUS) 100 UNIT/ML injection Inject 20 Units into the skin at bedtime.     [provider]  levothyroxine (SYNTHROID, LEVOTHROID) 25 MCG tablet Take 1 tablet (25 mcg total) by mouth daily before breakfast. 05/10/17   Soyla Dryer, PA-C  lisinopril (PRINIVIL,ZESTRIL) 20 MG tablet Take 2 tablets (40 mg total) by mouth daily. 05/10/17  Soyla Dryer, PA-C  metFORMIN (GLUCOPHAGE) 1000 MG tablet Take 1 tablet (1,000 mg total) by mouth 2 (two) times daily with a meal. 03/12/17   Soyla Dryer, PA-C    Physical Exam: Vitals:   07/08/17 0142  Weight: 65.8 kg (145 lb)  Height: 5\' 3"  (1.6 m)      Constitutional: NAD, calm, comfortable Eyes: PERTLA, lids and conjunctivae normal ENMT: Mucous membranes are moist. Posterior pharynx clear of any exudate or lesions.   Neck: normal, supple, no masses, no thyromegaly Respiratory: clear to auscultation bilaterally, no wheezing, no crackles. Normal  respiratory effort.    Cardiovascular: S1 & S2 heard, regular rate and rhythm. No significant JVD. Abdomen: No distension, no tenderness, no masses palpated. Bowel sounds normal.  Musculoskeletal: no clubbing / cyanosis. No joint deformity upper and lower extremities.    Skin: no significant rashes, lesions, ulcers. Warm, dry, well-perfused. Neurologic: CN 2-12 grossly intact. Sensation intact. Strength 5/5 in all 4 limbs.  Psychiatric: Alert and oriented x 3. Calm, cooperative.     Labs on Admission: I have personally reviewed following labs and imaging studies  CBC: Recent Labs  Lab 07/08/17 0115  WBC 9.0  HGB 11.3*  HCT 34.2*  MCV 94.5  PLT 371   Basic Metabolic Panel: Recent Labs  Lab 07/08/17 0115  NA 135  K 4.2  CL 106  CO2 24  GLUCOSE 312*  BUN 13  CREATININE 0.83  CALCIUM 8.6*   GFR: Estimated Creatinine Clearance: 62.5 mL/min (by C-G formula based on SCr of 0.83 mg/dL). Liver Function Tests: No results for input(s): AST, ALT, ALKPHOS, BILITOT, PROT, ALBUMIN in the last 168 hours. No results for input(s): LIPASE, AMYLASE in the last 168 hours. No results for input(s): AMMONIA in the last 168 hours. Coagulation Profile: No results for input(s): INR, PROTIME in the last 168 hours. Cardiac Enzymes: Recent Labs  Lab 07/08/17 0115  TROPONINI 0.57*   BNP (last 3 results) No results for input(s): PROBNP in the last 8760 hours. HbA1C: No results for input(s): HGBA1C in the last 72 hours. CBG: No results for input(s): GLUCAP in the last 168 hours. Lipid Profile: No results for input(s): CHOL, HDL, LDLCALC, TRIG, CHOLHDL, LDLDIRECT in the last 72 hours. Thyroid Function Tests: No results for input(s): TSH, T4TOTAL, FREET4, T3FREE, THYROIDAB in the last 72 hours. Anemia Panel: No results for input(s): VITAMINB12, FOLATE, FERRITIN, TIBC, IRON, RETICCTPCT in the last 72 hours. Urine analysis:    Component Value Date/Time   COLORURINE YELLOW 07/28/2016 1703     APPEARANCEUR CLEAR 07/28/2016 1703   LABSPEC 1.010 07/28/2016 1703   PHURINE 6.0 07/28/2016 1703   GLUCOSEU >1000 (A) 07/28/2016 1703   HGBUR TRACE (A) 07/28/2016 1703   BILIRUBINUR NEGATIVE 07/28/2016 1703   KETONESUR NEGATIVE 07/28/2016 1703   PROTEINUR 100 (A) 07/28/2016 1703   NITRITE NEGATIVE 07/28/2016 1703   LEUKOCYTESUR NEGATIVE 07/28/2016 1703   Sepsis Labs: @LABRCNTIP (procalcitonin:4,lacticidven:4) )No results found for this or any previous visit (from the past 240 hour(s)).   Radiological Exams on Admission: Dg Chest 2 View  Result Date: 07/08/2017 CLINICAL DATA:  Chest pain. EXAM: CHEST  2 VIEW COMPARISON:  Chest CTA 07/28/2016 FINDINGS: Streaky right upper lobe opacities suspicious for pneumonia. Normal heart size and mediastinal contours. No pulmonary edema, pleural fluid or pneumothorax. No acute osseous abnormalities. IMPRESSION: Streaky right upper lobe opacities suspicious for pneumonia. Followup PA and lateral chest X-ray is recommended in 3-4 weeks following trial of antibiotic therapy to ensure resolution and  exclude underlying malignancy. Electronically Signed   By: Jeb Levering M.D.   On: 07/08/2017 03:14    EKG: Independently reviewed. Sinus rhythm, T-wave flattening and inversions in inferior leads, similar to prior.   Assessment/Plan  1. Non-STEMI - Pt presents with a day of intermittent chest pain, atypical description, but with significant risk-factors and elevated troponin - She has been treated with ASA 324 mg, started on heparin infusion  - Plan to continue cardiac monitoring, trend troponin, continue ASA, statin, and ACE, use Lopressor as needed, discuss with cardiology    2. Insulin-dependent DM  - A1c was 6.6% in August 2018  - Managed at home with Lantus 15 units qHS and metformin  - She is NPO on admission and will be treated with Lantus 8 units qHS and low-intensity SSI   3. Hypertension  - Continue lisinopril, hold Norvasc, use  beta-blocker prn    4. Hypothyroidism  - TSH normal two months ago  - Continue Synthroid   5. CAP  - Pt reports cough for the past week, denies fever or significant dyspnea  - CXR suggests PNA in RUL - No fever or leukocytosis, no hypoxia  - Plan to continue Rocephin and azithromycin for now while checking sputum culture and procalcitonin    DVT prophylaxis: IV heparin  Code Status: Full  Family Communication: Discussed with patient Disposition Plan: Admit to SDU at [facility] Consults called: Cardiology Admission status: Inpatient    Vianne Bulls, MD [facility] Pager [phone]  If 7PM-7AM, please contact night-coverage [email or web address] Password [facility]  07/08/2017, 3:44 AM

## 2017-07-08 NOTE — ED Triage Notes (Signed)
Pain in center of chest and lt side since last night

## 2017-07-08 NOTE — Progress Notes (Signed)
ANTICOAGULATION CONSULT NOTE - Follow Up Consult  Pharmacy Consult for heparin Indication: chest pain/ACS  No Known Allergies  Patient Measurements: Height: 5\' 3"  (160 cm) Weight: 145 lb (65.8 kg) IBW/kg (Calculated) : 52.4 Heparin Dosing Weight: 65 kg  Vital Signs: Temp: 98 F (36.7 C) (11/04 0800) Temp Source: Oral (11/04 0800) BP: 158/73 (11/04 0800) Pulse Rate: 73 (11/04 0800)  Labs: Recent Labs    07/08/17 0115 07/08/17 0456 07/08/17 1041  HGB 11.3* 11.5*  --   HCT 34.2* 34.4*  --   PLT 263 274  --   APTT 37*  --   --   LABPROT 12.0  --   --   INR 0.89  --   --   HEPARINUNFRC  --   --  <0.10*  CREATININE 0.83 0.73  --   TROPONINI 0.57* 0.73*  --     Estimated Creatinine Clearance: 64.8 mL/min (by C-G formula based on SCr of 0.73 mg/dL).   Medications:  Heparin 750 units/hr  Assessment: 66 yoF on heparin drip for ACS rule out. Initial level undetectable, per RN there have been no issues with infusion. CBC stable, no S/Sx bleeding noted. Cards planning for eventual cardiac cath.   Goal of Therapy:  Heparin level 0.3-0.7 units/ml Monitor platelets by anticoagulation protocol: Yes   Plan:  -Heparin 1500 units x1 -Increase heparin infusion to 950 units/hr -Check 6-hr heparin level -Monitor heparin level, CBC, S/Sx bleeding daily  Arrie Senate, PharmD PGY-2 Cardiology Pharmacy Resident Pager: [phone] 07/08/2017

## 2017-07-09 ENCOUNTER — Other Ambulatory Visit (HOSPITAL_COMMUNITY): Payer: Self-pay

## 2017-07-09 ENCOUNTER — Encounter (HOSPITAL_COMMUNITY): Payer: Self-pay | Admitting: Interventional Cardiology

## 2017-07-09 ENCOUNTER — Encounter (HOSPITAL_COMMUNITY)
Admission: EM | Disposition: A | Discharge status: Home / Self Care | Payer: Self-pay | Source: Home / Self Care | Attending: Internal Medicine

## 2017-07-09 DIAGNOSIS — I25110 Atherosclerotic heart disease of native coronary artery with unstable angina pectoris: Secondary | ICD-10-CM

## 2017-07-09 HISTORY — PX: LEFT HEART CATH AND CORONARY ANGIOGRAPHY: CATH118249

## 2017-07-09 HISTORY — PX: CORONARY STENT INTERVENTION: CATH118234

## 2017-07-09 LAB — GLUCOSE, CAPILLARY
GLUCOSE-CAPILLARY: 163 mg/dL — AB (ref 65–99)
GLUCOSE-CAPILLARY: 224 mg/dL — AB (ref 65–99)
GLUCOSE-CAPILLARY: 156 mg/dL — AB (ref 65–99)
Glucose-Capillary: 107 mg/dL — ABNORMAL HIGH (ref 65–99)
Glucose-Capillary: 206 mg/dL — ABNORMAL HIGH (ref 65–99)
Glucose-Capillary: 181 mg/dL — ABNORMAL HIGH (ref 65–99)

## 2017-07-09 LAB — CBC
HEMATOCRIT: 34.8 % — AB (ref 36.0–46.0)
HEMOGLOBIN: 11.3 g/dL — AB (ref 12.0–15.0)
MCH: 30 pg (ref 26.0–34.0)
MCHC: 32.5 g/dL (ref 30.0–36.0)
MCV: 92.3 fL (ref 78.0–100.0)
Platelets: 296 10*3/uL (ref 150–400)
RBC: 3.77 MIL/uL — AB (ref 3.87–5.11)
RDW: 13.3 % (ref 11.5–15.5)
WBC: 9.6 10*3/uL (ref 4.0–10.5)

## 2017-07-09 LAB — POCT ACTIVATED CLOTTING TIME: Activated Clotting Time: 296 seconds

## 2017-07-09 LAB — CREATININE, SERUM
Creatinine, Ser: 0.67 mg/dL (ref 0.44–1.00)
GFR calc Af Amer: >60 mL/min (ref >60)

## 2017-07-09 LAB — HEPARIN LEVEL (UNFRACTIONATED): HEPARIN UNFRACTIONATED: 0.5 [IU]/mL (ref 0.30–0.70)

## 2017-07-09 LAB — PROCALCITONIN

## 2017-07-09 LAB — HIV ANTIBODY (ROUTINE TESTING W REFLEX): HIV Screen 4th Generation wRfx: NONREACTIVE

## 2017-07-09 SURGERY — LEFT HEART CATH AND CORONARY ANGIOGRAPHY
Anesthesia: LOCAL

## 2017-07-09 MED ORDER — MIDAZOLAM HCL 2 MG/2ML IJ SOLN
INTRAMUSCULAR | Status: AC
  Filled 2017-07-09: qty 2

## 2017-07-09 MED ORDER — SODIUM CHLORIDE 0.9% FLUSH
3.0000 mL | Freq: Two times a day (BID) | INTRAVENOUS | Status: DC
  Administered 2017-07-09 (×2): 3 mL via INTRAVENOUS

## 2017-07-09 MED ORDER — SODIUM CHLORIDE 0.9 % IV SOLN: 250.0000 mL | INTRAVENOUS | Status: DC | PRN

## 2017-07-09 MED ORDER — SODIUM CHLORIDE 0.9% FLUSH: 3.0000 mL | Freq: Two times a day (BID) | INTRAVENOUS | Status: DC

## 2017-07-09 MED ORDER — LIDOCAINE HCL (PF) 1 % IJ SOLN
INTRAMUSCULAR | Status: DC | PRN
  Administered 2017-07-09: 2 mL

## 2017-07-09 MED ORDER — FENTANYL CITRATE (PF) 100 MCG/2ML IJ SOLN
INTRAMUSCULAR | Status: AC
  Filled 2017-07-09: qty 2

## 2017-07-09 MED ORDER — IOPAMIDOL (ISOVUE-370) INJECTION 76%
INTRAVENOUS | Status: DC | PRN
  Administered 2017-07-09: 155 mL

## 2017-07-09 MED ORDER — IOPAMIDOL (ISOVUE-370) INJECTION 76%
INTRAVENOUS | Status: AC
  Filled 2017-07-09: qty 100

## 2017-07-09 MED ORDER — SODIUM CHLORIDE 0.9 % WEIGHT BASED INFUSION: 3.0000 mL/kg/h | INTRAVENOUS | Status: DC

## 2017-07-09 MED ORDER — ONDANSETRON HCL 4 MG/2ML IJ SOLN: 4.0000 mg | Freq: Four times a day (QID) | INTRAMUSCULAR | Status: DC | PRN

## 2017-07-09 MED ORDER — SODIUM CHLORIDE 0.9 % WEIGHT BASED INFUSION
1.0000 mL/kg/h | INTRAVENOUS | Status: DC
  Administered 2017-07-09: 1 mL/kg/h via INTRAVENOUS

## 2017-07-09 MED ORDER — NITROGLYCERIN 1 MG/10 ML FOR IR/CATH LAB
INTRA_ARTERIAL | Status: AC
  Filled 2017-07-09: qty 10

## 2017-07-09 MED ORDER — METOPROLOL TARTRATE 25 MG PO TABS
25.0000 mg | ORAL_TABLET | Freq: Two times a day (BID) | ORAL | Status: DC
  Administered 2017-07-09 – 2017-07-10 (×3): 25 mg via ORAL
  Filled 2017-07-09 (×3): qty 1

## 2017-07-09 MED ORDER — LABETALOL HCL 5 MG/ML IV SOLN: 10.0000 mg | INTRAVENOUS | Status: AC | PRN

## 2017-07-09 MED ORDER — CLOPIDOGREL BISULFATE 300 MG PO TABS
ORAL_TABLET | ORAL | Status: DC | PRN
  Administered 2017-07-09: 600 mg via ORAL

## 2017-07-09 MED ORDER — HEPARIN SODIUM (PORCINE) 1000 UNIT/ML IJ SOLN
INTRAMUSCULAR | Status: AC
  Filled 2017-07-09: qty 1

## 2017-07-09 MED ORDER — SODIUM CHLORIDE 0.9% FLUSH: 3.0000 mL | INTRAVENOUS | Status: DC | PRN

## 2017-07-09 MED ORDER — MORPHINE SULFATE (PF) 4 MG/ML IV SOLN: 2.0000 mg | INTRAVENOUS | Status: DC | PRN

## 2017-07-09 MED ORDER — MIDAZOLAM HCL 2 MG/2ML IJ SOLN
INTRAMUSCULAR | Status: DC | PRN
  Administered 2017-07-09: 1 mg via INTRAVENOUS

## 2017-07-09 MED ORDER — HEPARIN SODIUM (PORCINE) 1000 UNIT/ML IJ SOLN
INTRAMUSCULAR | Status: DC | PRN
  Administered 2017-07-09: 4000 [IU] via INTRAVENOUS
  Administered 2017-07-09: 6000 [IU] via INTRAVENOUS
  Administered 2017-07-09: 2000 [IU] via INTRAVENOUS

## 2017-07-09 MED ORDER — OXYCODONE HCL 5 MG PO TABS: 5.0000 mg | ORAL_TABLET | ORAL | Status: DC | PRN

## 2017-07-09 MED ORDER — HEPARIN (PORCINE) IN NACL 2-0.9 UNIT/ML-% IJ SOLN
INTRAMUSCULAR | Status: AC
  Filled 2017-07-09: qty 1000

## 2017-07-09 MED ORDER — HEPARIN SODIUM (PORCINE) 5000 UNIT/ML IJ SOLN
5000.0000 [IU] | Freq: Three times a day (TID) | INTRAMUSCULAR | Status: DC
  Administered 2017-07-09 – 2017-07-10 (×2): 5000 [IU] via SUBCUTANEOUS
  Filled 2017-07-09 (×2): qty 1

## 2017-07-09 MED ORDER — ACETAMINOPHEN 325 MG PO TABS: 650.0000 mg | ORAL_TABLET | ORAL | Status: DC | PRN

## 2017-07-09 MED ORDER — CLOPIDOGREL BISULFATE 300 MG PO TABS
ORAL_TABLET | ORAL | Status: AC
  Filled 2017-07-09: qty 2

## 2017-07-09 MED ORDER — VERAPAMIL HCL 2.5 MG/ML IV SOLN
INTRAVENOUS | Status: DC | PRN
  Administered 2017-07-09: 10 mL via INTRA_ARTERIAL

## 2017-07-09 MED ORDER — HYDRALAZINE HCL 20 MG/ML IJ SOLN: 5.0000 mg | INTRAMUSCULAR | Status: AC | PRN

## 2017-07-09 MED ORDER — AMLODIPINE BESYLATE 5 MG PO TABS
5.0000 mg | ORAL_TABLET | Freq: Every day | ORAL | Status: DC
  Administered 2017-07-09 – 2017-07-10 (×2): 5 mg via ORAL
  Filled 2017-07-09 (×2): qty 1

## 2017-07-09 MED ORDER — NITROGLYCERIN 1 MG/10 ML FOR IR/CATH LAB
INTRA_ARTERIAL | Status: DC | PRN
  Administered 2017-07-09: 200 ug via INTRA_ARTERIAL
  Administered 2017-07-09: 200 ug via INTRACORONARY

## 2017-07-09 MED ORDER — LIDOCAINE HCL (PF) 1 % IJ SOLN
INTRAMUSCULAR | Status: AC
  Filled 2017-07-09: qty 30

## 2017-07-09 MED ORDER — FENTANYL CITRATE (PF) 100 MCG/2ML IJ SOLN
INTRAMUSCULAR | Status: DC | PRN
  Administered 2017-07-09 (×2): 50 ug via INTRAVENOUS

## 2017-07-09 MED ORDER — CLOPIDOGREL BISULFATE 75 MG PO TABS
75.0000 mg | ORAL_TABLET | Freq: Every day | ORAL | Status: DC
  Administered 2017-07-10: 08:00:00 75 mg via ORAL
  Filled 2017-07-09: qty 1

## 2017-07-09 MED ORDER — ASPIRIN 81 MG PO CHEW
81.0000 mg | CHEWABLE_TABLET | ORAL | Status: AC
  Administered 2017-07-09: 81 mg via ORAL

## 2017-07-09 MED ORDER — VERAPAMIL HCL 2.5 MG/ML IV SOLN
INTRAVENOUS | Status: AC
  Filled 2017-07-09: qty 2

## 2017-07-09 MED ORDER — ASPIRIN 81 MG PO CHEW
81.0000 mg | CHEWABLE_TABLET | Freq: Every day | ORAL | Status: DC
  Administered 2017-07-10: 08:00:00 81 mg via ORAL
  Filled 2017-07-09: qty 1

## 2017-07-09 MED ORDER — HEPARIN (PORCINE) IN NACL 2-0.9 UNIT/ML-% IJ SOLN
INTRAMUSCULAR | Status: DC | PRN
  Administered 2017-07-09: 10:00:00

## 2017-07-09 MED ORDER — SODIUM CHLORIDE 0.9 % IV SOLN
INTRAVENOUS | Status: AC
  Administered 2017-07-09: 11:00:00 via INTRAVENOUS

## 2017-07-09 SURGICAL SUPPLY — 21 items
BALLN SAPPHIRE 2.5X20 (BALLOONS) ×2
BALLN ~~LOC~~ EMERGE MR 3.5X20 (BALLOONS) ×2
BALLOON SAPPHIRE 2.5X20 (BALLOONS) IMPLANT
BALLOON ~~LOC~~ EMERGE MR 3.5X20 (BALLOONS) IMPLANT
CATH INFINITI 5 FR JL3.5 (CATHETERS) ×1 IMPLANT
CATH INFINITI JR4 5F (CATHETERS) ×1 IMPLANT
CATH VISTA GUIDE 6FR XBRCA (CATHETERS) ×1 IMPLANT
COVER PRB 48X5XTLSCP FOLD TPE (BAG) IMPLANT
COVER PROBE 5X48 (BAG) ×2
DEVICE RAD COMP TR BAND LRG (VASCULAR PRODUCTS) ×1 IMPLANT
GLIDESHEATH SLEND A-KIT 6F 22G (SHEATH) ×1 IMPLANT
GUIDEWIRE INQWIRE 1.5J.035X260 (WIRE) IMPLANT
INQWIRE 1.5J .035X260CM (WIRE) ×4
KIT ENCORE 26 ADVANTAGE (KITS) ×1 IMPLANT
KIT HEART LEFT (KITS) ×2 IMPLANT
PACK CARDIAC CATHETERIZATION (CUSTOM PROCEDURE TRAY) ×2 IMPLANT
STENT SYNERGY DES 3X28 (Permanent Stent) ×1 IMPLANT
TRANSDUCER W/STOPCOCK (MISCELLANEOUS) ×2 IMPLANT
TUBING CIL FLEX 10 FLL-RA (TUBING) ×2 IMPLANT
WIRE ASAHI PROWATER 180CM (WIRE) ×1 IMPLANT
WIRE HI TORQ VERSACORE-J 145CM (WIRE) ×1 IMPLANT

## 2017-07-09 NOTE — Progress Notes (Signed)
PROGRESS NOTE    Lisa Jenkins  DVV:[id number] DOB: 1950-11-28 DOA: 07/08/2017 PCP: Soyla Dryer, PA-C   Chief Complaint  Patient presents with  . Chest Pain    Brief Narrative:  HPI On 07/08/2017 by Dr. [place] a 66 y.o.femalewith medical history significant forhypertension, insulin-dependent diabetes mellitus, hyperlipidemia, tobacco abuse, and family history of premature CAD, now presenting to the emergency department with 1 day of intermittent chest pain. Patient reports that she has had intermittent episodes of localized, burning, moderate to severe chest pain, lasting 5-10 minutes at a time, worse with lying down, and with no other alleviating or exacerbating factors identified. The patient took Tumsfor her symptoms, but with no relief. Also reports a cough for the past week, but denies fevers or chills, and denies any significant dyspnea.  Assessment & Plan   NSTEMI -Presented with intermittent chest pain, atypical in nature -elevated troponin: 0.57, 0.73 -Patient does have significant risk factors: HTN, HLD, DM, smoking, family history -Cardiology consulted and appreciated -pending echocardiogram and catheterization today 07/09/2017  IDDM -Hemoglobin A1c was 6.6 in August 2018 -Continue lantus, ISS, and CBG monitoring   Hypertension -Norvasc held -Continue lisinopril  Hypothyroidism -Continue Synthroid -TSH 4.429 in August 2018  Hyperlipidemia -Statin increased, will need repeat lipid panel and CMP in 4 weeks  CAP -Reported cough last week, denied fever or shortness of breath -Chest x-ray shows pneumonia in the right upper lobe -Currently afebrile with no leukocytosis, no hypoxia -On azithromycin and Rocephin -Procalcitonin <0.10  Tobacco abuse -dicussed cesastion  DVT Prophylaxis  heparin  Code Status: Full  Family Communication: None at bedside  Disposition Plan: Admitted, pending catheterization  toay  Consultants Cardiology  Procedures  None  Antibiotics   Anti-infectives (From admission, onward)   Start     Dose/Rate Route Frequency Ordered Stop   07/09/17 0330  cefTRIAXone (ROCEPHIN) 1 g in dextrose 5 % 50 mL IVPB     1 g 100 mL/hr over 30 Minutes Intravenous Every 24 hours 07/08/17 0344 07/15/17 0329   07/08/17 0345  azithromycin (ZITHROMAX) 500 mg in dextrose 5 % 250 mL IVPB     500 mg 250 mL/hr over 60 Minutes Intravenous Every 24 hours 07/08/17 0344 07/15/17 0344   07/08/17 0330  cefTRIAXone (ROCEPHIN) 1 g in dextrose 5 % 50 mL IVPB     1 g 100 mL/hr over 30 Minutes Intravenous  Once 07/08/17 0324 07/08/17 0520      Subjective:   Lisa Jenkins seen and examined today.  Denies further chest pain. Denies shortness of breath, abdominal pain, N/V dizziness, headache.  Objective:   Vitals:   07/08/17 0800 07/08/17 1154 07/08/17 1531 07/08/17 2000  BP: (!) 158/73 (!) 142/67 (!) 140/58   Pulse: 73  63   Resp: 19  18   Temp: 98 F (36.7 C) 99.1 F (37.3 C) 98.2 F (36.8 C) 98 F (36.7 C)  TempSrc: Oral Oral Oral Oral  SpO2: 92%  96%   Weight:      Height:        Intake/Output Summary (Last 24 hours) at 07/09/2017 0816 Last data filed at 07/08/2017 1700 Gross per 24 hour  Intake 327.67 ml  Output -  Net 327.67 ml   Filed Weights   07/08/17 0142  Weight: 65.8 kg (145 lb)   Exam  General: Well developed, well nourished, NAD, appears stated age  HEENT: NCAT, mucous membranes moist.   Cardiovascular: S1 S2 auscultated, RRR, no murmurs  Respiratory: Clear to auscultation bilaterally with equal chest rise  Abdomen: Soft, nontender, nondistended, + bowel sounds  Extremities: warm dry without cyanosis clubbing or edema  Neuro: AAOx3,nonfocal  Psych: appropriate    Data Reviewed: I have personally reviewed following labs and imaging studies  CBC: Recent Labs  Lab 07/08/17 0115 07/08/17 0456 07/09/17 0323  WBC 9.0 9.9 9.6  HGB 11.3* 11.5*  11.3*  HCT 34.2* 34.4* 34.8*  MCV 94.5 93.7 92.3  PLT 263 274 026   Basic Metabolic Panel: Recent Labs  Lab 07/08/17 0115 07/08/17 0456  NA 135 137  K 4.2 4.1  CL 106 104  CO2 24 22  GLUCOSE 312* 207*  BUN 13 13  CREATININE 0.83 0.73  CALCIUM 8.6* 8.7*   GFR: Estimated Creatinine Clearance: 64.8 mL/min (by C-G formula based on SCr of 0.73 mg/dL). Liver Function Tests: No results for input(s): AST, ALT, ALKPHOS, BILITOT, PROT, ALBUMIN in the last 168 hours. No results for input(s): LIPASE, AMYLASE in the last 168 hours. No results for input(s): AMMONIA in the last 168 hours. Coagulation Profile: Recent Labs  Lab 07/08/17 0115  INR 0.89   Cardiac Enzymes: Recent Labs  Lab 07/08/17 0115 07/08/17 0456  TROPONINI 0.57* 0.73*   BNP (last 3 results) No results for input(s): PROBNP in the last 8760 hours. HbA1C: No results for input(s): HGBA1C in the last 72 hours. CBG: Recent Labs  Lab 07/08/17 1203 07/08/17 1556 07/08/17 2004 07/09/17 0128 07/09/17 0434  GLUCAP 164* 130* 328* 107* 163*   Lipid Profile: No results for input(s): CHOL, HDL, LDLCALC, TRIG, CHOLHDL, LDLDIRECT in the last 72 hours. Thyroid Function Tests: No results for input(s): TSH, T4TOTAL, FREET4, T3FREE, THYROIDAB in the last 72 hours. Anemia Panel: No results for input(s): VITAMINB12, FOLATE, FERRITIN, TIBC, IRON, RETICCTPCT in the last 72 hours. Urine analysis:    Component Value Date/Time   COLORURINE YELLOW 07/28/2016 1703   APPEARANCEUR CLEAR 07/28/2016 1703   LABSPEC 1.010 07/28/2016 1703   PHURINE 6.0 07/28/2016 1703   GLUCOSEU >1000 (A) 07/28/2016 1703   HGBUR TRACE (A) 07/28/2016 1703   BILIRUBINUR NEGATIVE 07/28/2016 1703   KETONESUR NEGATIVE 07/28/2016 1703   PROTEINUR 100 (A) 07/28/2016 1703   NITRITE NEGATIVE 07/28/2016 1703   LEUKOCYTESUR NEGATIVE 07/28/2016 1703   Sepsis Labs: @LABRCNTIP (procalcitonin:4,lacticidven:4)  ) Recent Results (from the past 240 hour(s))   MRSA PCR Screening     Status: None   Collection Time: 07/08/17  7:36 AM  Result Value Ref Range Status   MRSA by PCR NEGATIVE NEGATIVE Final    Comment:        The GeneXpert MRSA Assay (FDA approved for NASAL specimens only), is one component of a comprehensive MRSA colonization surveillance program. It is not intended to diagnose MRSA infection nor to guide or monitor treatment for MRSA infections.       Radiology Studies: Dg Chest 2 View  Result Date: 07/08/2017 CLINICAL DATA:  Chest pain. EXAM: CHEST  2 VIEW COMPARISON:  Chest CTA 07/28/2016 FINDINGS: Streaky right upper lobe opacities suspicious for pneumonia. Normal heart size and mediastinal contours. No pulmonary edema, pleural fluid or pneumothorax. No acute osseous abnormalities. IMPRESSION: Streaky right upper lobe opacities suspicious for pneumonia. Followup PA and lateral chest X-ray is recommended in 3-4 weeks following trial of antibiotic therapy to ensure resolution and exclude underlying malignancy. Electronically Signed   By: Jeb Levering M.D.   On: 07/08/2017 03:14     Scheduled Meds: . aspirin  81 mg  Oral Daily  . aspirin  81 mg Oral Pre-Cath  . atorvastatin  80 mg Oral q1800  . insulin aspart  0-9 Units Subcutaneous Q4H  . insulin glargine  8 Units Subcutaneous QHS  . levothyroxine  25 mcg Oral QAC breakfast  . lisinopril  40 mg Oral Daily  . metoprolol tartrate  12.5 mg Oral BID  . sodium chloride flush  3 mL Intravenous Q12H   Continuous Infusions: . sodium chloride    . sodium chloride    . azithromycin Stopped (07/09/17 0532)  . cefTRIAXone (ROCEPHIN)  IV Stopped (07/09/17 0532)  . heparin 950 Units/hr (07/08/17 1700)     LOS: 1 day   Time Spent in minutes   30 minutes  Nathanel Tallman D.O. on 07/09/2017 at 8:16 AM  Between 7am to 7pm - Pager - [phone]  After 7pm go to [email or web address] - password [email or web address]  And look for the night coverage person covering for me after hours  [facility] Office  [phone]

## 2017-07-09 NOTE — Progress Notes (Signed)
TR BAND REMOVAL  LOCATION:    right radial  DEFLATED PER PROTOCOL:    Yes.    TIME BAND OFF / DRESSING APPLIED:    1515   SITE UPON ARRIVAL:    Level 0  SITE AFTER BAND REMOVAL:    Level 0  CIRCULATION SENSATION AND MOVEMENT:    Within Normal Limits   Yes.    COMMENTS:   Tolerated well. Post removal education provided, teach back effective. No bleeding, no hematoma, and no bruising noted. Dressing applied C/D/I.

## 2017-07-09 NOTE — Interval H&P Note (Signed)
Cath Lab Visit (complete for each Cath Lab visit)  Clinical Evaluation Leading to the Procedure:   ACS: Yes.    Non-ACS:    Anginal Classification: CCS III  Anti-ischemic medical therapy: Minimal Therapy (1 class of medications)  Non-Invasive Test Results: No non-invasive testing performed  Prior CABG: No previous CABG      History and Physical Interval Note:  07/09/2017 9:36 AM  Lisa Jenkins  has presented today for surgery, with the diagnosis of NSTEMI  The various methods of treatment have been discussed with the patient and family. After consideration of risks, benefits and other options for treatment, the patient has consented to  Procedure(s): LEFT HEART CATH AND CORONARY ANGIOGRAPHY (N/A) as a surgical intervention .  The patient's history has been reviewed, patient examined, no change in status, stable for surgery.  I have reviewed the patient's chart and labs.  Questions were answered to the patient's satisfaction.     Belva Crome III

## 2017-07-09 NOTE — H&P (View-Only) (Signed)
Progress Note  Patient Name: Lisa Jenkins Date of Encounter: 07/09/2017  Primary Cardiologist: Dr Stanford Breed  Subjective   No chest pain or dyspnea  Inpatient Medications    Scheduled Meds: . aspirin  81 mg Oral Daily  . aspirin  81 mg Oral Pre-Cath  . atorvastatin  80 mg Oral q1800  . insulin aspart  0-9 Units Subcutaneous Q4H  . insulin glargine  8 Units Subcutaneous QHS  . levothyroxine  25 mcg Oral QAC breakfast  . lisinopril  40 mg Oral Daily  . metoprolol tartrate  12.5 mg Oral BID  . sodium chloride flush  3 mL Intravenous Q12H   Continuous Infusions: . sodium chloride    . sodium chloride    . azithromycin Stopped (07/09/17 0532)  . cefTRIAXone (ROCEPHIN)  IV Stopped (07/09/17 0532)  . heparin 950 Units/hr (07/08/17 1700)   PRN Meds: sodium chloride, acetaminophen, ALPRAZolam, morphine injection, nitroGLYCERIN, ondansetron (ZOFRAN) IV, sodium chloride flush   Vital Signs    Vitals:   07/08/17 0800 07/08/17 1154 07/08/17 1531 07/08/17 2000  BP: (!) 158/73 (!) 142/67 (!) 140/58   Pulse: 73  63   Resp: 19  18   Temp: 98 F (36.7 C) 99.1 F (37.3 C) 98.2 F (36.8 C) 98 F (36.7 C)  TempSrc: Oral Oral Oral Oral  SpO2: 92%  96%   Weight:      Height:        Intake/Output Summary (Last 24 hours) at 07/09/2017 0747 Last data filed at 07/08/2017 1700 Gross per 24 hour  Intake 342.67 ml  Output -  Net 342.67 ml   Filed Weights   07/08/17 0142  Weight: 145 lb (65.8 kg)    Telemetry    Sinus- Personally Reviewed   Physical Exam   GEN: No acute distress.   Neck: No JVD Cardiac: RRR, no murmurs, rubs, or gallops.  Respiratory: Clear to auscultation bilaterally. GI: Soft, nontender, non-distended  MS: No edema; No deformity. Neuro:  Nonfocal  Psych: Normal affect   Labs    Chemistry Recent Labs  Lab 07/08/17 0115 07/08/17 0456  NA 135 137  K 4.2 4.1  CL 106 104  CO2 24 22  GLUCOSE 312* 207*  BUN 13 13  CREATININE 0.83 0.73  CALCIUM  8.6* 8.7*  GFRNONAA >60 >60  GFRAA >60 >60  ANIONGAP 5 11     Hematology Recent Labs  Lab 07/08/17 0115 07/08/17 0456 07/09/17 0323  WBC 9.0 9.9 9.6  RBC 3.62* 3.67* 3.77*  HGB 11.3* 11.5* 11.3*  HCT 34.2* 34.4* 34.8*  MCV 94.5 93.7 92.3  MCH 31.2 31.3 30.0  MCHC 33.0 33.4 32.5  RDW 13.2 13.2 13.3  PLT 263 274 296    Cardiac Enzymes Recent Labs  Lab 07/08/17 0115 07/08/17 0456  TROPONINI 0.57* 0.73*    Radiology    Dg Chest 2 View  Result Date: 07/08/2017 CLINICAL DATA:  Chest pain. EXAM: CHEST  2 VIEW COMPARISON:  Chest CTA 07/28/2016 FINDINGS: Streaky right upper lobe opacities suspicious for pneumonia. Normal heart size and mediastinal contours. No pulmonary edema, pleural fluid or pneumothorax. No acute osseous abnormalities. IMPRESSION: Streaky right upper lobe opacities suspicious for pneumonia. Followup PA and lateral chest X-ray is recommended in 3-4 weeks following trial of antibiotic therapy to ensure resolution and exclude underlying malignancy. Electronically Signed   By: Jeb Levering M.D.   On: 07/08/2017 03:14     Patient Profile     66 y.o. female  admitted with NSTEMI  Assessment & Plan    1 non-ST elevation myocardial infarction-continue aspirin, heparin, statin and metoprolol. Proceed with cardiac cath today as outlined previously; await echo for LV function.    2 hyperlipidemia-continue Lipitor 80 mg daily. Check lipids and liver in 4 weeks.  3 hypertension-BP controlled; continue present meds.  4 tobacco abuse-patient counseled on discontinuing and states she is willing to try to quit.  5 diabetes mellitus-Follow CBG; per IM.  6 ? Pneumonia-antibiotics per IM.    For questions or updates, please contact [place] Please consult [email or web address] for contact info under Cardiology/STEMI.      Signed, Kirk Ruths, MD  07/09/2017, 7:47 AM

## 2017-07-09 NOTE — Progress Notes (Signed)
Progress Note  Patient Name: Lisa Jenkins Date of Encounter: 07/09/2017  Primary Cardiologist: Dr Stanford Breed  Subjective   No chest pain or dyspnea  Inpatient Medications    Scheduled Meds: . aspirin  81 mg Oral Daily  . aspirin  81 mg Oral Pre-Cath  . atorvastatin  80 mg Oral q1800  . insulin aspart  0-9 Units Subcutaneous Q4H  . insulin glargine  8 Units Subcutaneous QHS  . levothyroxine  25 mcg Oral QAC breakfast  . lisinopril  40 mg Oral Daily  . metoprolol tartrate  12.5 mg Oral BID  . sodium chloride flush  3 mL Intravenous Q12H   Continuous Infusions: . sodium chloride    . sodium chloride    . azithromycin Stopped (07/09/17 0532)  . cefTRIAXone (ROCEPHIN)  IV Stopped (07/09/17 0532)  . heparin 950 Units/hr (07/08/17 1700)   PRN Meds: sodium chloride, acetaminophen, ALPRAZolam, morphine injection, nitroGLYCERIN, ondansetron (ZOFRAN) IV, sodium chloride flush   Vital Signs    Vitals:   07/08/17 0800 07/08/17 1154 07/08/17 1531 07/08/17 2000  BP: (!) 158/73 (!) 142/67 (!) 140/58   Pulse: 73  63   Resp: 19  18   Temp: 98 F (36.7 C) 99.1 F (37.3 C) 98.2 F (36.8 C) 98 F (36.7 C)  TempSrc: Oral Oral Oral Oral  SpO2: 92%  96%   Weight:      Height:        Intake/Output Summary (Last 24 hours) at 07/09/2017 0747 Last data filed at 07/08/2017 1700 Gross per 24 hour  Intake 342.67 ml  Output -  Net 342.67 ml   Filed Weights   07/08/17 0142  Weight: 145 lb (65.8 kg)    Telemetry    Sinus- Personally Reviewed   Physical Exam   GEN: No acute distress.   Neck: No JVD Cardiac: RRR, no murmurs, rubs, or gallops.  Respiratory: Clear to auscultation bilaterally. GI: Soft, nontender, non-distended  MS: No edema; No deformity. Neuro:  Nonfocal  Psych: Normal affect   Labs    Chemistry Recent Labs  Lab 07/08/17 0115 07/08/17 0456  NA 135 137  K 4.2 4.1  CL 106 104  CO2 24 22  GLUCOSE 312* 207*  BUN 13 13  CREATININE 0.83 0.73  CALCIUM  8.6* 8.7*  GFRNONAA >60 >60  GFRAA >60 >60  ANIONGAP 5 11     Hematology Recent Labs  Lab 07/08/17 0115 07/08/17 0456 07/09/17 0323  WBC 9.0 9.9 9.6  RBC 3.62* 3.67* 3.77*  HGB 11.3* 11.5* 11.3*  HCT 34.2* 34.4* 34.8*  MCV 94.5 93.7 92.3  MCH 31.2 31.3 30.0  MCHC 33.0 33.4 32.5  RDW 13.2 13.2 13.3  PLT 263 274 296    Cardiac Enzymes Recent Labs  Lab 07/08/17 0115 07/08/17 0456  TROPONINI 0.57* 0.73*    Radiology    Dg Chest 2 View  Result Date: 07/08/2017 CLINICAL DATA:  Chest pain. EXAM: CHEST  2 VIEW COMPARISON:  Chest CTA 07/28/2016 FINDINGS: Streaky right upper lobe opacities suspicious for pneumonia. Normal heart size and mediastinal contours. No pulmonary edema, pleural fluid or pneumothorax. No acute osseous abnormalities. IMPRESSION: Streaky right upper lobe opacities suspicious for pneumonia. Followup PA and lateral chest X-ray is recommended in 3-4 weeks following trial of antibiotic therapy to ensure resolution and exclude underlying malignancy. Electronically Signed   By: Jeb Levering M.D.   On: 07/08/2017 03:14     Patient Profile     66 y.o. female  admitted with NSTEMI  Assessment & Plan    1 non-ST elevation myocardial infarction-continue aspirin, heparin, statin and metoprolol. Proceed with cardiac cath today as outlined previously; await echo for LV function.    2 hyperlipidemia-continue Lipitor 80 mg daily. Check lipids and liver in 4 weeks.  3 hypertension-BP controlled; continue present meds.  4 tobacco abuse-patient counseled on discontinuing and states she is willing to try to quit.  5 diabetes mellitus-Follow CBG; per IM.  6 ? Pneumonia-antibiotics per IM.    For questions or updates, please contact [place] Please consult [email or web address] for contact info under Cardiology/STEMI.      Signed, Kirk Ruths, MD  07/09/2017, 7:47 AM

## 2017-07-09 NOTE — Care Management Note (Addendum)
Case Management Note  Patient Details  Name: Lisa Jenkins MRN: [id number] Date of Birth: [birth year]  Subjective/Objective:  From home with [name](neice) , s/p coronary stent intervention,will be on plavix. She goes to the [facility] ,she has a follow up apt on Dec 6 with Soyla Dryer.  She states plavix 60.00 is a little much for her to pay right now.  The free clinic states she needs to still bring them some information and then she would be able to get plavix free, but it will take 30 days to process medast.  NCM will ast patient with Match Letter to get her plavix today.  [name] states she will make sure patient gets information to free clinic.                    Action/Plan: NCM will follow for dc needs.   Expected Discharge Date:                  Expected Discharge Plan:  Home/Self Care  In-House Referral:     Discharge planning Services  CM Consult  Post Acute Care Choice:    Choice offered to:     DME Arranged:    DME Agency:     HH Arranged:    [place] Agency:     Status of Service:  Completed, signed off  If discussed at [employer] of Stay Meetings, dates discussed:    Additional Comments:  Zenon Mayo, RN 07/09/2017, 3:57 PM

## 2017-07-10 ENCOUNTER — Other Ambulatory Visit (HOSPITAL_COMMUNITY): Payer: Self-pay

## 2017-07-10 DIAGNOSIS — J181 Lobar pneumonia, unspecified organism: Secondary | ICD-10-CM

## 2017-07-10 DIAGNOSIS — E11319 Type 2 diabetes mellitus with unspecified diabetic retinopathy without macular edema: Secondary | ICD-10-CM

## 2017-07-10 DIAGNOSIS — R079 Chest pain, unspecified: Secondary | ICD-10-CM

## 2017-07-10 DIAGNOSIS — I10 Essential (primary) hypertension: Secondary | ICD-10-CM

## 2017-07-10 DIAGNOSIS — I214 Non-ST elevation (NSTEMI) myocardial infarction: Principal | ICD-10-CM

## 2017-07-10 DIAGNOSIS — D649 Anemia, unspecified: Secondary | ICD-10-CM

## 2017-07-10 DIAGNOSIS — E782 Mixed hyperlipidemia: Secondary | ICD-10-CM

## 2017-07-10 DIAGNOSIS — E039 Hypothyroidism, unspecified: Secondary | ICD-10-CM

## 2017-07-10 LAB — GLUCOSE, CAPILLARY: Glucose-Capillary: 226 mg/dL — ABNORMAL HIGH (ref 65–99)

## 2017-07-10 LAB — BASIC METABOLIC PANEL
Anion gap: 7 (ref 5–15)
BUN: 11 mg/dL (ref 6–20)
CHLORIDE: 106 mmol/L (ref 101–111)
CO2: 24 mmol/L (ref 22–32)
Calcium: 8.8 mg/dL — ABNORMAL LOW (ref 8.9–10.3)
Creatinine, Ser: 0.72 mg/dL (ref 0.44–1.00)
GFR calc Af Amer: >60 mL/min (ref >60)
GFR calc non Af Amer: >60 mL/min (ref >60)
Glucose, Bld: 130 mg/dL — ABNORMAL HIGH (ref 65–99)
POTASSIUM: 3.8 mmol/L (ref 3.5–5.1)
SODIUM: 137 mmol/L (ref 135–145)

## 2017-07-10 LAB — CBC
HEMATOCRIT: 32.3 % — AB (ref 36.0–46.0)
Hemoglobin: 10.5 g/dL — ABNORMAL LOW (ref 12.0–15.0)
MCH: 29.8 pg (ref 26.0–34.0)
MCHC: 32.5 g/dL (ref 30.0–36.0)
MCV: 91.8 fL (ref 78.0–100.0)
Platelets: 258 10*3/uL (ref 150–400)
RBC: 3.52 MIL/uL — AB (ref 3.87–5.11)
RDW: 13.4 % (ref 11.5–15.5)
WBC: 8.8 10*3/uL (ref 4.0–10.5)

## 2017-07-10 LAB — POCT ACTIVATED CLOTTING TIME: ACTIVATED CLOTTING TIME: 296 s

## 2017-07-10 MED ORDER — AZITHROMYCIN 500 MG PO TABS
500.0000 mg | ORAL_TABLET | Freq: Every day | ORAL | Status: DC
  Administered 2017-07-10: 11:00:00 500 mg via ORAL
  Filled 2017-07-10: qty 1

## 2017-07-10 MED ORDER — ANGIOPLASTY BOOK
Freq: Once | Status: AC
  Administered 2017-07-10: 01:00:00
  Filled 2017-07-10: qty 1

## 2017-07-10 MED ORDER — METFORMIN HCL 1000 MG PO TABS: 1000.0000 mg | ORAL_TABLET | Freq: Two times a day (BID) | ORAL | 2 refills | Status: DC

## 2017-07-10 MED ORDER — AZITHROMYCIN 500 MG PO TABS: 500.0000 mg | ORAL_TABLET | Freq: Every day | ORAL | 0 refills | Status: DC

## 2017-07-10 MED ORDER — HEART ATTACK BOUNCING BOOK
Freq: Once | Status: AC
  Administered 2017-07-10: 01:00:00 1
  Filled 2017-07-10: qty 1

## 2017-07-10 MED ORDER — CEFUROXIME AXETIL 250 MG PO TABS: 500.0000 mg | ORAL_TABLET | Freq: Two times a day (BID) | ORAL | 0 refills | Status: AC

## 2017-07-10 MED ORDER — CLOPIDOGREL BISULFATE 75 MG PO TABS: 75.0000 mg | ORAL_TABLET | Freq: Every day | ORAL | 0 refills | Status: DC

## 2017-07-10 MED ORDER — METOPROLOL TARTRATE 25 MG PO TABS: 25.0000 mg | ORAL_TABLET | Freq: Two times a day (BID) | ORAL | 0 refills | Status: DC

## 2017-07-10 MED ORDER — ATORVASTATIN CALCIUM 80 MG PO TABS: 80.0000 mg | ORAL_TABLET | Freq: Every day | ORAL | 0 refills | Status: DC

## 2017-07-10 NOTE — Progress Notes (Signed)
Progress Note  Patient Name: Lisa Jenkins Date of Encounter: 07/10/2017  Primary Cardiologist: Dr Stanford Breed  Subjective   Pt denies CP or dyspnea  Inpatient Medications    Scheduled Meds: . amLODipine  5 mg Oral Daily  . aspirin  81 mg Oral Daily  . atorvastatin  80 mg Oral q1800  . clopidogrel  75 mg Oral Q breakfast  . heparin  5,000 Units Subcutaneous Q8H  . insulin aspart  0-9 Units Subcutaneous Q4H  . insulin glargine  8 Units Subcutaneous QHS  . levothyroxine  25 mcg Oral QAC breakfast  . lisinopril  40 mg Oral Daily  . metoprolol tartrate  25 mg Oral BID  . sodium chloride flush  3 mL Intravenous Q12H   Continuous Infusions: . sodium chloride    . azithromycin Stopped (07/10/17 0449)  . cefTRIAXone (ROCEPHIN)  IV Stopped (07/10/17 0330)   PRN Meds: sodium chloride, acetaminophen, ALPRAZolam, morphine injection, nitroGLYCERIN, ondansetron (ZOFRAN) IV, oxyCODONE, sodium chloride flush   Vital Signs    Vitals:   07/09/17 2100 07/09/17 2331 07/10/17 0000 07/10/17 0350  BP: (!) 144/53 (!) 162/54 (!) 152/61 (!) 154/66  Pulse:  63    Resp: 16 12 14 18   Temp:  98 F (36.7 C)  98.1 F (36.7 C)  TempSrc:  Oral    SpO2:  95% 95% 98%  Weight:    145 lb 15.1 oz (66.2 kg)  Height:        Intake/Output Summary (Last 24 hours) at 07/10/2017 0735 Last data filed at 07/10/2017 0500 Gross per 24 hour  Intake 1210.42 ml  Output 1650 ml  Net -439.58 ml   Filed Weights   07/08/17 0142 07/10/17 0350  Weight: 145 lb (65.8 kg) 145 lb 15.1 oz (66.2 kg)    Telemetry    Sinus- Personally Reviewed   Physical Exam   GEN: WD/WN No acute distress.   Neck: No JVD, supple Cardiac: RRR Respiratory: Clear to auscultation bilaterally; no wheeze GI: Soft, nontender, non-distended, no masses  MS: No edema; radial cath site with no hematoma Neuro:  Nonfocal, grossly intact   Labs    Chemistry Recent Labs  Lab 07/08/17 0115 07/08/17 0456 07/09/17 0323 07/10/17 0405   NA 135 137  --  137  K 4.2 4.1  --  3.8  CL 106 104  --  106  CO2 24 22  --  24  GLUCOSE 312* 207*  --  130*  BUN 13 13  --  11  CREATININE 0.83 0.73 0.67 0.72  CALCIUM 8.6* 8.7*  --  8.8*  GFRNONAA >60 >60 >60 >60  GFRAA >60 >60 >60 >60  ANIONGAP 5 11  --  7     Hematology Recent Labs  Lab 07/08/17 0456 07/09/17 0323 07/10/17 0405  WBC 9.9 9.6 8.8  RBC 3.67* 3.77* 3.52*  HGB 11.5* 11.3* 10.5*  HCT 34.4* 34.8* 32.3*  MCV 93.7 92.3 91.8  MCH 31.3 30.0 29.8  MCHC 33.4 32.5 32.5  RDW 13.2 13.3 13.4  PLT 274 296 258    Cardiac Enzymes Recent Labs  Lab 07/08/17 0115 07/08/17 0456  TROPONINI 0.57* 0.73*     Patient Profile     66 y.o. female admitted with NSTEMI  Assessment & Plan    1 non-ST elevation myocardial infarction-S/P PCI of RCA; normal LV fx. Continue ASA, plavix and statin.   2 hyperlipidemia-continue present dose of lipitor. Check lipids and liver in 4 weeks.  3 hypertension-BP  controlled; continue present meds.  4 tobacco abuse-Pt states "I have quit".  5 diabetes mellitus-Management per IM.  6 ? Pneumonia-antibiotics per IM. Will need FU chest xray with IM.  Pt can be DCed from a cardiac standpoint; FU with PA for TOC appt one week; fu with me 3 months   For questions or updates, please contact [place] Please consult [email or web address] for contact info under Cardiology/STEMI.      Signed, Kirk Ruths, MD  07/10/2017, 7:35 AM

## 2017-07-10 NOTE — Progress Notes (Signed)
PHARMACIST - PHYSICIAN COMMUNICATION  CONCERNING: Antibiotic IV to Oral Route Change Policy  RECOMMENDATION: This patient is receiving azithromycin by the intravenous route.  Based on criteria approved by the Pharmacy and Therapeutics Committee, the antibiotic(s) is/are being converted to the equivalent oral dose form(s).   DESCRIPTION: These criteria include:  Patient being treated for a respiratory tract infection, urinary tract infection, cellulitis or clostridium difficile associated diarrhea if on metronidazole  The patient is not neutropenic and does not exhibit a GI malabsorption state  The patient is eating (either orally or via tube) and/or has been taking other orally administered medications for a least 24 hours  The patient is improving clinically and has a Tmax < 100.5  If you have questions about this conversion, please contact the Pharmacy Department  []   [phone] )  [facility] []   [phone] )  [facility] [x]   [phone] )  [facility] []   [phone] )  [facility] []   [phone] )  [place], [place] D 07/10/2017 9:14 AM

## 2017-07-10 NOTE — Discharge Instructions (Signed)
Acute Coronary Syndrome °Acute coronary syndrome (ACS) is a serious problem in which there is suddenly not enough blood and oxygen supplied to the heart. ACS may mean that one or more of the blood vessels in your heart (coronary arteries) may be blocked. ACS can result in chest pain or a heart attack (myocardial infarction or MI). °What are the causes? °This condition is caused by atherosclerosis, which is the buildup of fat and cholesterol (plaque) on the inside of the arteries. Over time, the plaque may narrow or block the artery, and this will lessen blood flow to the heart. Plaque can also become weak and break off within a coronary artery to form a clot and cause a sudden blockage. °What increases the risk? °The risk factors of this condition include: °· High cholesterol levels. °· High blood pressure (hypertension). °· Smoking. °· Diabetes. °· Age. °· Family history of chest pain, heart disease, or stroke. °· Lack of exercise. °What are the signs or symptoms? °The most common signs of this condition include: °· Chest pain, which can be: °¨ A crushing or squeezing in the chest. °¨ A tightness, pressure, fullness, or heaviness in the chest. °¨ Present for more than a few minutes, or it can stop and recur. °· Pain in the arms, neck, jaw, or back. °· Unexplained heartburn or indigestion. °· Shortness of breath. °· Nausea. °· Sudden cold sweats. °· Feeling light-headed or dizzy. °Sometimes, this condition has no symptoms. °How is this diagnosed? °ACS may be diagnosed through the following tests: °· Electrocardiogram (ECG). °· Blood tests. °· Coronary angiogram. This is a procedure to look at the coronary arteries to see if there is any blockage. °How is this treated? °Treatment for ACS may include: °· Healthy behavioral changes to reduce or control risk factors. °· Medicine. °· Coronary stenting. A stent helps to keep an artery open. °· Coronary angioplasty. This procedure widens a narrowed or blocked  artery. °· Coronary artery bypass surgery. This will allow your blood to pass the blockage (bypass) to reach your heart. °Follow these instructions at home: °Eating and drinking °· Follow a heart-healthy diet. A dietitian can you help to educate you about healthy food options and changes. °· Use healthy cooking methods such as roasting, grilling, broiling, baking, poaching, steaming, or stir-frying. Talk to a dietitian to learn more about healthy cooking methods. °Medicines °· Take medicines only as directed by your health care provider. °· Do not take the following medicines unless your health care provider approves: °¨ Nonsteroidal anti-inflammatory drugs (NSAIDs), such as ibuprofen, naproxen, or celecoxib. °¨ Vitamin supplements that contain vitamin A, vitamin E, or both. °¨ Hormone replacement therapy that contains estrogen with or without progestin. °· Stop illegal drug use. °Activity °· Follow an exercise program that is approved by your health care provider. °· Plan rest periods when you are fatigued. °Lifestyle °· Do not use any tobacco products, including cigarettes, chewing tobacco, or electronic cigarettes. If you need help quitting, ask your health care provider. °· If you drink alcohol, and your health care provider approves, limit your alcohol intake to no more than 1 drink per day. One drink equals 12 ounces of beer, 5 ounces of wine, or 1½ ounces of hard liquor. °· Learn to manage stress. °· Maintain a healthy weight. Lose weight as approved by your health care provider. °General instructions °· Manage other health conditions, such as hypertension and diabetes, as directed by your health care provider. °· Keep all follow-up visits as directed by your   health care provider. This is important. °· Your health care provider may ask you to monitor your blood pressure. A blood pressure reading consists of a higher number over a lower number, such as 110 over 72, written as 110/72. Ideally, your blood  pressure should be: °¨ Below 140/90 if you have no other medical conditions. °¨ Below 130/80 if you have diabetes or kidney disease. °Get help right away if: °· You have pain in your chest, neck, arm, jaw, stomach, or back that lasts more than a few minutes, is recurring, or is not relieved by taking medicine under your tongue (sublingual nitroglycerin). °· You have profuse sweating without cause. °· You have unexplained: °¨ Heartburn or indigestion. °¨ Shortness of breath or difficulty breathing. °¨ Nausea or vomiting. °¨ Fatigue. °¨ Feelings of nervousness or anxiety. °¨ Weakness. °¨ Diarrhea. °· You have sudden light-headedness or dizziness. °· You faint. °These symptoms may represent a serious problem that is an emergency. Do not wait to see if the symptoms will go away. Get medical help right away. Call your local emergency services (911 in the [place] Do not drive yourself to the clinic or hospital.  °This information is not intended to replace advice given to you by your health care provider. Make sure you discuss any questions you have with your health care provider. °Document Released: 08/21/2005 Document Revised: 02/02/2016 Document Reviewed: 12/23/2013 °Elsevier Interactive Patient Education © 2017 [employer] ° °

## 2017-07-10 NOTE — Care Management Note (Signed)
Case Management Note  Patient Details  Name: Lisa Jenkins MRN: [id number] Date of Birth: 1950-12-06  Subjective/Objective:    From home with [name](neice) , s/p coronary stent intervention,will be on plavix. She goes to the [facility] ,she has a follow up apt on Dec 6 with Soyla Dryer.  She states plavix 60.00 is a little much for her to pay right now.  The free clinic states she needs to still bring them some information and then she would be able to get plavix free, but it will take 30 days to process medast.  NCM will ast patient with Match Letter to get her plavix today.  [name] states she will make sure patient gets information to free clinic.                                   Action/Plan: DC home today, with Match letter for med ast,has follow up apt at [facility] clinic who will ast her with refills of medication.  Expected Discharge Date:  07/10/17               Expected Discharge Plan:  Home/Self Care  In-House Referral:     Discharge planning Services  CM Consult, Medication Assistance, [place] [facility], Follow-up appt scheduled, [facility]  Post Acute Care Choice:    Choice offered to:     DME Arranged:    DME Agency:     HH Arranged:    [place] Agency:     Status of Service:  Completed, signed off  If discussed at [employer] of [employer], dates discussed:    Additional Comments:  Zenon Mayo, RN 07/10/2017, 10:21 AM

## 2017-07-10 NOTE — Discharge Summary (Signed)
Physician Discharge Summary  Lisa Jenkins QPY:[id number] DOB: Oct 03, 1950 DOA: 07/08/2017  PCP: Lisa Dryer, PA-C  Admit date: 07/08/2017 Discharge date: 07/10/2017  Time spent: 45 minutes  Recommendations for Outpatient Follow-up:  Patient will be discharged to home.  Patient will need to follow up with primary care provider within one week of discharge.  Will need to have repeat CMP and lipid panel in 4 weeks.  Follow up with cardiology PA in one week as well as Dr. Stanford Breed in 3 months. Patient should continue medications as prescribed.  Patient should follow a heart healthy/carb modified diet. RESTART metformin on 07/12/2017.  Discharge Diagnoses:  NSTEMI IDDM Hypertension Hypothyroidism Hyperlipidemia CAP Tobacco abuse  Discharge Condition: stable  Diet recommendation: Heart healthy/carb modified  Filed Weights   07/08/17 0142 07/10/17 0350  Weight: 65.8 kg (145 lb) 66.2 kg (145 lb 15.1 oz)    History of present illness:  On 07/08/2017 by Lisa Jenkins a 66 y.o.femalewith medical history significant forhypertension, insulin-dependent diabetes mellitus, hyperlipidemia, tobacco abuse, and family history of premature CAD, now presenting to the emergency department with 1 day of intermittent chest pain. Patient reports that she has had intermittent episodes of localized, burning, moderate to severe chest pain, lasting 5-10 minutes at a time, worse with lying down, and with no other alleviating or exacerbating factors identified. The patient took Tumsfor her symptoms, but with no relief. Also reports a cough for the past week, but denies fevers or chills, and denies any significant dyspnea.  Hospital Course:  NSTEMI -Presented with intermittent chest pain, atypical in nature -elevated troponin: 0.57, 0.73 -Patient does have significant risk factors: HTN, HLD, DM, smoking, family history -Cardiology consulted and appreciated -s/p cardiac  catheterization: High-grade obstruction of the mid right coronary, EF 55% -S/p PCI of RCA -Continue aspirin, plavix, statin -metoprolol added  IDDM -Hemoglobin A1c was 6.6 in August 2018 -Hold metformin for 48 hours -continue insulin   Hypertension -Continue lisinopril and norvasc  Hypothyroidism -Continue Synthroid -TSH 4.429 in August 2018  Hyperlipidemia -Statin increased, will need repeat lipid panel and CMP in 4 weeks  CAP -Reported cough last week, denied fever or shortness of breath -Chest x-ray shows pneumonia in the right upper lobe -Currently afebrile with no leukocytosis, no hypoxia -Was placed on azithromycin and Rocephin -Procalcitonin<0.10 -Discharge with azithromycin and ceftin  Tobacco abuse -dicussed cesastion  Procedures: Left heart cath and coronary angiography, coronary stent intervention  Consultations: Cardiology  Discharge Exam: Vitals:   07/10/17 0350 07/10/17 0730  BP: (!) 154/66 128/60  Pulse:  75  Resp: 18 18  Temp: 98.1 F (36.7 C) 98.3 F (36.8 C)  SpO2: 98% 95%     General: Well developed, well nourished, NAD, appears stated age  HEENT: NCAT, PERRLA, EOMI, Anicteic Sclera, mucous membranes moist.  Neck: Supple, no JVD, no masses  Cardiovascular: S1 S2 auscultated, no rubs, murmurs or gallops. Regular rate and rhythm.  Respiratory: Clear to auscultation bilaterally with equal chest rise  Abdomen: Soft, nontender, nondistended, + bowel sounds  Extremities: warm dry without cyanosis clubbing or edema  Neuro: AAOx3, cranial nerves grossly intact. Strength 5/5 in patient's upper and lower extremities bilaterally  Skin: Without rashes exudates or nodules  Psych: Normal affect and demeanor with intact judgement and insight  Discharge Instructions Discharge Instructions    Amb Referral to Cardiac Rehabilitation   Complete by:  As directed    Diagnosis:   Coronary Stents NSTEMI     Discharge instructions  Complete by:  As directed    Patient will be discharged to home.  Patient will need to follow up with primary care provider within one week of discharge.  Will need to have repeat CMP and lipid panel in 4 weeks.  Follow up with cardiology PA in one week as well as Dr. Stanford Breed in 3 months. Patient should continue medications as prescribed.  Patient should follow a heart healthy/carb modified diet. RESTART metformin on 07/12/2017.     Current Discharge Medication List    START taking these medications   Details  azithromycin (ZITHROMAX) 500 MG tablet Take 1 tablet (500 mg total) daily by mouth. Qty: 3 tablet, Refills: 0    cefUROXime (CEFTIN) 250 MG tablet Take 2 tablets (500 mg total) 2 (two) times daily for 3 days by mouth. Qty: 6 tablet, Refills: 0    clopidogrel (PLAVIX) 75 MG tablet Take 1 tablet (75 mg total) daily with breakfast by mouth. Qty: 30 tablet, Refills: 0    metoprolol tartrate (LOPRESSOR) 25 MG tablet Take 1 tablet (25 mg total) 2 (two) times daily by mouth. Qty: 60 tablet, Refills: 0      CONTINUE these medications which have CHANGED   Details  atorvastatin (LIPITOR) 80 MG tablet Take 1 tablet (80 mg total) daily at 6 PM by mouth. Qty: 30 tablet, Refills: 0    metFORMIN (GLUCOPHAGE) 1000 MG tablet Take 1 tablet (1,000 mg total) 2 (two) times daily with a meal by mouth. Restart on 07/12/2017 Qty: 60 tablet, Refills: 2      CONTINUE these medications which have NOT CHANGED   Details  amLODipine (NORVASC) 5 MG tablet Take 1 tablet (5 mg total) by mouth daily. Qty: 30 tablet, Refills: 3    aspirin 81 MG chewable tablet Chew 81 mg by mouth daily.     insulin glargine (LANTUS) 100 UNIT/ML injection Inject 20 Units into the skin at bedtime.     levothyroxine (SYNTHROID, LEVOTHROID) 25 MCG tablet Take 1 tablet (25 mcg total) by mouth daily before breakfast. Qty: 30 tablet, Refills: 3    lisinopril (PRINIVIL,ZESTRIL) 20 MG tablet Take 2 tablets (40 mg total) by mouth  daily. Qty: 60 tablet, Refills: 3       No Known Allergies Follow-up Information    Lisa Dryer, PA-C. Schedule an appointment as soon as possible for a visit in 1 week(s).   Specialty:  Physician Assistant Why:  Hospital follow up Contact information: [address] [place] [address] [phone]        Lelon Perla, MD. Schedule an appointment as soon as possible for a visit in 1 week(s).   Specialty:  Cardiology Why:  Hospital follow up Contact information: [address] [place] [address] [phone]            The results of significant diagnostics from this hospitalization (including imaging, microbiology, ancillary and laboratory) are listed below for reference.    Significant Diagnostic Studies: Dg Chest 2 View  Result Date: 07/08/2017 CLINICAL DATA:  Chest pain. EXAM: CHEST  2 VIEW COMPARISON:  Chest CTA 07/28/2016 FINDINGS: Streaky right upper lobe opacities suspicious for pneumonia. Normal heart size and mediastinal contours. No pulmonary edema, pleural fluid or pneumothorax. No acute osseous abnormalities. IMPRESSION: Streaky right upper lobe opacities suspicious for pneumonia. Followup PA and lateral chest X-ray is recommended in 3-4 weeks following trial of antibiotic therapy to ensure resolution and exclude underlying malignancy. Electronically Signed   By: Jeb Levering  M.D.   On: 07/08/2017 03:14    Microbiology: Recent Results (from the past 240 hour(s))  MRSA PCR Screening     Status: None   Collection Time: 07/08/17  7:36 AM  Result Value Ref Range Status   MRSA by PCR NEGATIVE NEGATIVE Final    Comment:        The GeneXpert MRSA Assay (FDA approved for NASAL specimens only), is one component of a comprehensive MRSA colonization surveillance program. It is not intended to diagnose MRSA infection nor to guide or monitor treatment for MRSA infections.      Labs: Basic Metabolic Panel: Recent Labs  Lab  07/08/17 0115 07/08/17 0456 07/09/17 0323 07/10/17 0405  NA 135 137  --  137  K 4.2 4.1  --  3.8  CL 106 104  --  106  CO2 24 22  --  24  GLUCOSE 312* 207*  --  130*  BUN 13 13  --  11  CREATININE 0.83 0.73 0.67 0.72  CALCIUM 8.6* 8.7*  --  8.8*   Liver Function Tests: No results for input(s): AST, ALT, ALKPHOS, BILITOT, PROT, ALBUMIN in the last 168 hours. No results for input(s): LIPASE, AMYLASE in the last 168 hours. No results for input(s): AMMONIA in the last 168 hours. CBC: Recent Labs  Lab 07/08/17 0115 07/08/17 0456 07/09/17 0323 07/10/17 0405  WBC 9.0 9.9 9.6 8.8  HGB 11.3* 11.5* 11.3* 10.5*  HCT 34.2* 34.4* 34.8* 32.3*  MCV 94.5 93.7 92.3 91.8  PLT 263 274 296 258   Cardiac Enzymes: Recent Labs  Lab 07/08/17 0115 07/08/17 0456  TROPONINI 0.57* 0.73*   BNP: BNP (last 3 results) No results for input(s): BNP in the last 8760 hours.  ProBNP (last 3 results) No results for input(s): PROBNP in the last 8760 hours.  CBG: Recent Labs  Lab 07/09/17 0825 07/09/17 1057 07/09/17 1649 07/09/17 2154 07/10/17 0607  GLUCAP 224* 156* 206* 181* 226*       Signed:  Blakley Michna  [facility] 07/10/2017, 9:42 AM

## 2017-07-10 NOTE — Progress Notes (Signed)
CARDIAC REHAB PHASE I   PRE:  Rate/Rhythm: 71 SR  BP:  Sitting: 160/77        SaO2: 90 RA  MODE:  Ambulation: 500 ft   POST:  Rate/Rhythm: 85 SR  BP:  Sitting: 161/58         SaO2: 98 RA  Pt ambulated 500 ft on RA, handheld assist, slow, steady gait, tolerated well with no complaints. Completed MI/stent education with pt and pt's niece at bedside.  Reviewed risk factors, tobacco cessation (pt declined fake cigarette), MI/PCI books, anti-platelet therapy, stent card, activity restrictions, ntg, exercise, heart healthy and diabetes diet handouts and phase 2 cardiac rehab. Pt and niece verbalized understanding. Pt agrees to phase 2 cardiac rehab referral, will send to [facility]. Pt up ad lib, in room, states she wants to go smoke, awaiting discharge.  [place], RN, BSN 07/10/2017 9:11 AM

## 2017-07-12 ENCOUNTER — Encounter (HOSPITAL_COMMUNITY): Payer: Self-pay | Admitting: Interventional Cardiology

## 2017-07-17 ENCOUNTER — Other Ambulatory Visit: Payer: Self-pay | Admitting: Physician Assistant

## 2017-07-17 MED ORDER — METOPROLOL TARTRATE 25 MG PO TABS: 25.0000 mg | ORAL_TABLET | Freq: Two times a day (BID) | ORAL | 1 refills | Status: DC

## 2017-07-17 MED ORDER — LEVOTHYROXINE SODIUM 25 MCG PO TABS: 25.0000 ug | ORAL_TABLET | Freq: Every day | ORAL | 1 refills | Status: DC

## 2017-07-17 MED ORDER — ATORVASTATIN CALCIUM 80 MG PO TABS: 80.0000 mg | ORAL_TABLET | Freq: Every day | ORAL | 1 refills | Status: DC

## 2017-07-17 MED ORDER — LISINOPRIL 20 MG PO TABS: 40.0000 mg | ORAL_TABLET | Freq: Every day | ORAL | 1 refills | Status: DC

## 2017-07-17 MED ORDER — METFORMIN HCL 1000 MG PO TABS: 1000.0000 mg | ORAL_TABLET | Freq: Two times a day (BID) | ORAL | 1 refills | Status: DC

## 2017-07-17 MED ORDER — CLOPIDOGREL BISULFATE 75 MG PO TABS: 75.0000 mg | ORAL_TABLET | Freq: Every day | ORAL | 1 refills | Status: DC

## 2017-07-17 MED ORDER — AMLODIPINE BESYLATE 5 MG PO TABS: 5.0000 mg | ORAL_TABLET | Freq: Every day | ORAL | 1 refills | Status: DC

## 2017-07-19 ENCOUNTER — Encounter: Payer: Self-pay | Admitting: Physician Assistant

## 2017-07-19 ENCOUNTER — Ambulatory Visit (INDEPENDENT_AMBULATORY_CARE_PROVIDER_SITE_OTHER): Payer: Self-pay | Admitting: Physician Assistant

## 2017-07-19 VITALS — BP 150/77 | HR 56 | Ht 63.0 in | Wt 148.0 lb

## 2017-07-19 DIAGNOSIS — Z79899 Other long term (current) drug therapy: Secondary | ICD-10-CM

## 2017-07-19 DIAGNOSIS — E782 Mixed hyperlipidemia: Secondary | ICD-10-CM

## 2017-07-19 DIAGNOSIS — E119 Type 2 diabetes mellitus without complications: Secondary | ICD-10-CM

## 2017-07-19 DIAGNOSIS — I2510 Atherosclerotic heart disease of native coronary artery without angina pectoris: Secondary | ICD-10-CM

## 2017-07-19 DIAGNOSIS — IMO0001 Reserved for inherently not codable concepts without codable children: Secondary | ICD-10-CM

## 2017-07-19 DIAGNOSIS — Z720 Tobacco use: Secondary | ICD-10-CM

## 2017-07-19 DIAGNOSIS — I10 Essential (primary) hypertension: Secondary | ICD-10-CM

## 2017-07-19 DIAGNOSIS — Z794 Long term (current) use of insulin: Secondary | ICD-10-CM

## 2017-07-19 LAB — CBC
HEMATOCRIT: 35.7 % (ref 34.0–46.6)
HEMOGLOBIN: 11.5 g/dL (ref 11.1–15.9)
MCH: 30.2 pg (ref 26.6–33.0)
MCHC: 32.2 g/dL (ref 31.5–35.7)
MCV: 94 fL (ref 79–97)
Platelets: 409 10*3/uL — ABNORMAL HIGH (ref 150–379)
RBC: 3.81 x10E6/uL (ref 3.77–5.28)
RDW: 14.1 % (ref 12.3–15.4)
WBC: 10.1 10*3/uL (ref 3.4–10.8)

## 2017-07-19 NOTE — Patient Instructions (Signed)
Lisa Jenkins, [place] recommends that you continue on your current medications as directed. Please refer to the Current Medication list given to you today.  Lisa Jenkins, [place] recommends that you return for lab work TODAY - [place], [place] recommends that you return for lab work in 6-8 weeks to check cholesterol & liver function. You will need to be fasting for this blood work (nothing to eat/drink after midnight). This can be done at [facility] in [place], [place] recommends that you have a CHEST X-RAY at your primary care providers office  Please monitor your home blood pressures + heart rate and bring a list of these readings to your next appointment.  Lisa Jenkins, [place] recommends that you schedule a follow-up appointment in: 2 months with Dr. Stanford Breed

## 2017-07-19 NOTE — Progress Notes (Signed)
Cardiology Office Note    Date:  07/21/2017   ID:  Lisa Jenkins, DOB 1951/01/19, MRN [id number]  PCP:  Soyla Dryer, PA-C  Cardiologist:  Dr. Stanford Breed   Chief Complaint  Patient presents with  . Hospitalization Follow-up    post PCI, seen for Dr. Stanford Breed    History of Present Illness:  Lisa Jenkins is a 66 y.o. female with PMH of HTN, DM II on insulin, HLD, tobacco abuse and FHx of premature CAD recently presented to the hospital on 07/08/2017 with chest pain and ruled in for NSTEMI. She eventually underwent cardiac catheterization on 07/09/2017 which showed 90% mid RCA lesion treated with a 3.0 x 28 mm Synergy DES, eccentric 50% proximal to mid LAD lesion, widely patent left main and left circumflex, EF 55%. Post-cath, she was placed on aspirin and Plavix. She was also placed on high-dose Lipitor. During this admission, chest x-ray showed streaky right upper lobe opacity suspicious for pneumonia, it was recommended for the patient to have follow-up chest x-ray with his primary care provider in 3-4 weeks following trial of antibiotic therapy to ensure resolution and exclude underlying malignancy. She was discharged with azithromycin and Ceftin.  She presents today for cardiology office visit. She describes her angina symptom as a substernal chest pain radiating to the right shoulder. She has not experienced any anginal symptoms since discharge. She has been compliant with her medication. She continues to smoke to this day, she says she is determined to stop smoking and set the day of tobacco cessation to December 13. When asked the reason why she set a date to stop smoking, she says that's the anniversary of her mother's passing. I strongly recommended her to stop smoking and discussed with her correlation between tobacco and coronary artery disease. She has been compliant with aspirin and Plavix. She does not have any lower extremity edema, orthopnea or PND. She will need a repeat chest  x-ray when she sees her primary care provider next month. Although  She lives in [place] with her niece, she is willing to drive to [place] area to see Dr. Stanford Breed. I will set up her to see Dr. Stanford Breed in 3 month. Her blood pressure is elevated today, however previous blood pressure was normal, I will not increase blood pressure medication due to a single elevated blood pressure. If blood pressure continued to be elevated, I would consider addition of another blood pressure medication instead. Her bradycardia prevent up titration of metoprolol.   Past Medical History:  Diagnosis Date  . Diabetes mellitus without complication ([place])   . High cholesterol   . Hypertension     Past Surgical History:  Procedure Laterality Date  . BUNIONECTOMY Bilateral   . CORONARY STENT INTERVENTION N/A 07/09/2017   Performed by Belva Crome, MD at [facility]  . LEFT HEART CATH AND CORONARY ANGIOGRAPHY N/A 07/09/2017   Performed by Belva Crome, MD at [facility]    Current Medications: Outpatient Medications Prior to Visit  Medication Sig Dispense Refill  . amLODipine (NORVASC) 5 MG tablet Take 1 tablet (5 mg total) daily by mouth. 90 tablet 1  . aspirin 81 MG chewable tablet Chew 81 mg by mouth daily.     Marland Kitchen atorvastatin (LIPITOR) 80 MG tablet Take 1 tablet (80 mg total) daily at 6 PM by mouth. 90 tablet 1  . azithromycin (ZITHROMAX) 500 MG tablet Take 1 tablet (500 mg total) daily by mouth. 3 tablet  0  . clopidogrel (PLAVIX) 75 MG tablet Take 1 tablet (75 mg total) daily with breakfast by mouth. 90 tablet 1  . insulin glargine (LANTUS) 100 UNIT/ML injection Inject 20 Units into the skin at bedtime.     Marland Kitchen levothyroxine (SYNTHROID, LEVOTHROID) 25 MCG tablet Take 1 tablet (25 mcg total) daily before breakfast by mouth. 90 tablet 1  . lisinopril (PRINIVIL,ZESTRIL) 20 MG tablet Take 2 tablets (40 mg total) daily by mouth. 180 tablet 1  . metFORMIN (GLUCOPHAGE) 1000 MG tablet Take 1  tablet (1,000 mg total) 2 (two) times daily with a meal by mouth. Restart on 07/12/2017 180 tablet 1  . metoprolol tartrate (LOPRESSOR) 25 MG tablet Take 1 tablet (25 mg total) 2 (two) times daily by mouth. 180 tablet 1   No facility-administered medications prior to visit.      Allergies:   Patient has no known allergies.   Social History   Socioeconomic History  . Marital status: Single    Spouse name: None  . Number of children: None  . Years of education: None  . Highest education level: None  Social Needs  . Financial resource strain: None  . Food insecurity - worry: None  . Food insecurity - inability: None  . Transportation needs - medical: None  . Transportation needs - non-medical: None  Occupational History  . None  Tobacco Use  . Smoking status: Current Every Day Smoker    Packs/day: 0.50    Years: 46.00    Pack years: 23.00  . Smokeless tobacco: Never Used  Substance and Sexual Activity  . Alcohol use: No    Alcohol/week: 0.0 oz  . Drug use: No  . Sexual activity: None  Other Topics Concern  . None  Social History Narrative  . None     Family History:  The patient's family history includes Diabetes in her brother, brother, brother, brother, brother, mother, and sister; Heart disease in her brother, brother, brother, brother, brother, father, mother, and sister.   ROS:   Please see the history of present illness.    ROS All other systems reviewed and are negative.   PHYSICAL EXAM:   VS:  BP (!) 150/77   Pulse (!) 56   Ht 5\' 3"  (1.6 m)   Wt 148 lb (67.1 kg)   BMI 26.22 kg/m    GEN: Well nourished, well developed, in no acute distress  HEENT: normal  Neck: no JVD, carotid bruits, or masses Cardiac: RRR; no murmurs, rubs, or gallops,no edema  Respiratory:  clear to auscultation bilaterally, normal work of breathing GI: soft, nontender, nondistended, + BS MS: no deformity or atrophy  Skin: warm and dry, no rash Neuro:  Alert and Oriented x 3,  Strength and sensation are intact Psych: euthymic mood, full affect  Wt Readings from Last 3 Encounters:  07/19/17 148 lb (67.1 kg)  07/10/17 145 lb 15.1 oz (66.2 kg)  05/10/17 145 lb (65.8 kg)      Studies/Labs Reviewed:   EKG:  EKG is ordered today.  The ekg ordered today demonstrates sinus bradycardia, nonspecific T wave changes  Recent Labs: 05/04/2017: ALT 7; TSH 4.429 07/10/2017: BUN 11; Creatinine, Ser 0.72; Potassium 3.8; Sodium 137 07/19/2017: Hemoglobin 11.5; Platelets 409   Lipid Panel    Component Value Date/Time   CHOL 165 05/04/2017 1319   TRIG 125 05/04/2017 1319   HDL 60 05/04/2017 1319   CHOLHDL 2.8 05/04/2017 1319   VLDL 25 05/04/2017 1319   LDLCALC  80 05/04/2017 1319    Additional studies/ records that were reviewed today include:   Cath 07/09/2017 Conclusion    High-grade obstruction of the mid right coronary serving as the culprit for the patient's presentation with acute coronary syndrome.  Successful drug-eluting stent implantation in the mid right coronary decreasing tubular segmental 90% stenosis to 0% with TIMI grade III flow using a 3.0 x 28 mm Synergy post dilating to 3.5 mm in diameter.  Widely patent left main, and circumflex.  Eccentric 50% proximal to mid LAD after the first diagonal.  Overall normal LV function.  EF estimated to be 55% with normal filling pressures.  RECOMMENDATIONS:  Aspirin and Plavix for 12 months.  Risk factor modification: Smoking cessation, aggressive lipid-lowering to LDL less than 70, blood pressure control, and screening for diabetes mellitus.  Eligible for discharge in a.m.      ASSESSMENT:    1. Coronary artery disease involving native coronary artery of native heart without angina pectoris   2. Medication management   3. Mixed hyperlipidemia   4. IDDM (insulin dependent diabetes mellitus) ([place])   5. Essential hypertension   6. Tobacco abuse      PLAN:  In order of problems listed  above:  1. CAD: s/p DES to [facility], continued to have 50% disease in the proximal LAD. Continue spirin, Plavix, high-dose statin and beta blocker.  2. Hypertension: Blood pressure elevated, however were not uptitrated medication based on a single elevated blood pressure. If elevated on followup, then I would recommend add another blood pressure medication. Her bradycardia prevent uptitration of beta blocker.  3. Hyperlipidemia: on 80 mg daily of Lipitor. Fasting lipid panel and LFT in 6-8 weeks  4. DM II: on insulin. Managed by primary care provider  5. Tobacco abuse: Continue to smoke to this day, however she is willing to quit. She has set a date for tobacco cessation, December 13, the anniversary of her mother's passing. We discussed the correlation between tobacco abuse and coronary artery disease today    Medication Adjustments/Labs and Tests Ordered: Current medicines are reviewed at length with the patient today.  Concerns regarding medicines are outlined above.  Medication changes, Labs and Tests ordered today are listed in the Patient Instructions below. Patient Instructions  Isaac Laud, [place] recommends that you continue on your current medications as directed. Please refer to the Current Medication list given to you today.  Isaac Laud, [place] recommends that you return for lab work TODAY - [place], [place] recommends that you return for lab work in 6-8 weeks to check cholesterol & liver function. You will need to be fasting for this blood work (nothing to eat/drink after midnight). This can be done at [facility] in [place], [place] recommends that you have a CHEST X-RAY at your primary care providers office  Please monitor your home blood pressures + heart rate and bring a list of these readings to your next appointment.  Isaac Laud, [place] recommends that you schedule a follow-up appointment in: 2 months with Dr. Stanford Breed       Signed, Almyra Deforest, [place]  07/21/2017 10:21 AM    [address], [place], [place]  [address] Phone: [phone]; Fax: [phone]

## 2017-07-19 NOTE — Progress Notes (Signed)
Red blood cell count improved, but platelet high. Make sure patient continue to take aspirin and plavix

## 2017-07-20 ENCOUNTER — Encounter: Payer: Self-pay | Admitting: *Deleted

## 2017-07-20 NOTE — Telephone Encounter (Signed)
-----   Message from [place], [place] sent at 07/19/2017  4:55 PM EST ----- Red blood cell count improved, but platelet high. Make sure patient continue to take aspirin and plavix

## 2017-07-20 NOTE — Telephone Encounter (Signed)
This encounter was created in error - please disregard.

## 2017-07-21 ENCOUNTER — Encounter: Payer: Self-pay | Admitting: Physician Assistant

## 2017-07-30 ENCOUNTER — Other Ambulatory Visit: Payer: Self-pay | Admitting: Physician Assistant

## 2017-07-31 NOTE — Addendum Note (Signed)
Addended by: Leland Johns A on: 07/31/2017 04:14 PM   Modules accepted: Orders

## 2017-08-02 ENCOUNTER — Other Ambulatory Visit (HOSPITAL_COMMUNITY)
Admission: RE | Admit: 2017-08-02 | Discharge: 2017-08-02 | Disposition: A | Discharge status: Home / Self Care | Payer: Self-pay | Source: Ambulatory Visit | Attending: Physician Assistant | Admitting: Physician Assistant

## 2017-08-02 DIAGNOSIS — I10 Essential (primary) hypertension: Secondary | ICD-10-CM | POA: Insufficient documentation

## 2017-08-02 DIAGNOSIS — E11319 Type 2 diabetes mellitus with unspecified diabetic retinopathy without macular edema: Secondary | ICD-10-CM | POA: Insufficient documentation

## 2017-08-02 DIAGNOSIS — E785 Hyperlipidemia, unspecified: Secondary | ICD-10-CM | POA: Insufficient documentation

## 2017-08-02 DIAGNOSIS — E039 Hypothyroidism, unspecified: Secondary | ICD-10-CM | POA: Insufficient documentation

## 2017-08-02 LAB — COMPREHENSIVE METABOLIC PANEL WITH GFR
ALT: 12 U/L — ABNORMAL LOW (ref 14–54)
AST: 13 U/L — ABNORMAL LOW (ref 15–41)
Albumin: 3.5 g/dL (ref 3.5–5.0)
Alkaline Phosphatase: 112 U/L (ref 38–126)
Anion gap: 6 (ref 5–15)
BUN: 9 mg/dL (ref 6–20)
CO2: 29 mmol/L (ref 22–32)
Calcium: 9.2 mg/dL (ref 8.9–10.3)
Chloride: 101 mmol/L (ref 101–111)
Creatinine, Ser: 0.69 mg/dL (ref 0.44–1.00)
GFR calc Af Amer: >60 mL/min (ref >60)
GFR calc non Af Amer: >60 mL/min (ref >60)
Glucose, Bld: 113 mg/dL — ABNORMAL HIGH (ref 65–99)
Potassium: 3.7 mmol/L (ref 3.5–5.1)
Sodium: 136 mmol/L (ref 135–145)
Total Bilirubin: 0.4 mg/dL (ref 0.3–1.2)
Total Protein: 7 g/dL (ref 6.5–8.1)

## 2017-08-02 LAB — HEMOGLOBIN A1C
HEMOGLOBIN A1C: 8.7 % — AB (ref 4.8–5.6)
Mean Plasma Glucose: 202.99 mg/dL

## 2017-08-02 LAB — LIPID PANEL
Cholesterol: 134 mg/dL (ref 0–200)
HDL: 48 mg/dL (ref >40)
LDL CALC: 74 mg/dL (ref 0–99)
TRIGLYCERIDES: 59 mg/dL (ref <150)
Total CHOL/HDL Ratio: 2.8 RATIO
VLDL: 12 mg/dL (ref 0–40)

## 2017-08-02 LAB — TSH: TSH: 6.475 u[IU]/mL — ABNORMAL HIGH (ref 0.350–4.500)

## 2017-08-09 ENCOUNTER — Ambulatory Visit: Payer: Self-pay | Admitting: Physician Assistant

## 2017-08-09 ENCOUNTER — Encounter: Payer: Self-pay | Admitting: Physician Assistant

## 2017-08-09 VITALS — BP 132/72 | HR 61 | Temp 97.9°F | Ht 62.0 in | Wt 146.5 lb

## 2017-08-09 DIAGNOSIS — F17219 Nicotine dependence, cigarettes, with unspecified nicotine-induced disorders: Secondary | ICD-10-CM

## 2017-08-09 DIAGNOSIS — E785 Hyperlipidemia, unspecified: Secondary | ICD-10-CM

## 2017-08-09 DIAGNOSIS — E1165 Type 2 diabetes mellitus with hyperglycemia: Secondary | ICD-10-CM

## 2017-08-09 DIAGNOSIS — I2510 Atherosclerotic heart disease of native coronary artery without angina pectoris: Secondary | ICD-10-CM

## 2017-08-09 DIAGNOSIS — I10 Essential (primary) hypertension: Secondary | ICD-10-CM

## 2017-08-09 DIAGNOSIS — E039 Hypothyroidism, unspecified: Secondary | ICD-10-CM

## 2017-08-09 MED ORDER — METFORMIN HCL 1000 MG PO TABS: 1000.0000 mg | ORAL_TABLET | Freq: Two times a day (BID) | ORAL | 1 refills | Status: DC

## 2017-08-09 NOTE — Progress Notes (Signed)
BP 132/72 (BP Location: Left Arm, Patient Position: Sitting, Cuff Size: Normal)   Pulse 61   Temp 97.9 F (36.6 C)   Ht 5\' 2"  (1.575 m)   Wt 146 lb 8 oz (66.5 kg)   SpO2 100%   BMI 26.80 kg/m    Subjective:    Patient ID: Lisa Jenkins, female    DOB: April 09, 1951, 66 y.o.   MRN: [id number]  HPI: Lisa Jenkins is a 66 y.o. female presenting on 08/09/2017 for Diabetes; Hyperlipidemia; Thyroid Problem; and Hypertension   HPI   Pt last here in Sept.  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  She had DES placement. She was started on asa and plavix.   She says she has felt better.  She feels tired. No CP or SOB.   She has been out of her metformin "for a pretty good while"   Pt is still smoking but says she is going to stop on december 12.   Relevant past medical, surgical, family and social history reviewed and updated as indicated. Interim medical history since our last visit reviewed. Allergies and medications reviewed and updated.   Current Outpatient Medications:  .  amLODipine (NORVASC) 5 MG tablet, Take 1 tablet (5 mg total) daily by mouth., Disp: 90 tablet, Rfl: 1 .  aspirin 81 MG chewable tablet, Chew 81 mg by mouth daily. , Disp: , Rfl:  .  atorvastatin (LIPITOR) 80 MG tablet, Take 1 tablet (80 mg total) daily at 6 PM by mouth., Disp: 90 tablet, Rfl: 1 .  clopidogrel (PLAVIX) 75 MG tablet, Take 1 tablet (75 mg total) daily with breakfast by mouth., Disp: 90 tablet, Rfl: 1 .  insulin glargine (LANTUS) 100 UNIT/ML injection, Inject 14 Units into the skin at bedtime. , Disp: , Rfl:  .  levothyroxine (SYNTHROID, LEVOTHROID) 25 MCG tablet, Take 1 tablet (25 mcg total) daily before breakfast by mouth., Disp: 90 tablet, Rfl: 1 .  lisinopril (PRINIVIL,ZESTRIL) 20 MG tablet, Take 2 tablets (40 mg total) daily by mouth., Disp: 180 tablet, Rfl: 1 .  metoprolol tartrate (LOPRESSOR) 25 MG tablet, Take 1 tablet (25 mg total) 2 (two) times daily by mouth., Disp: 180 tablet, Rfl: 1 .  metFORMIN  (GLUCOPHAGE) 1000 MG tablet, Take 1 tablet (1,000 mg total) 2 (two) times daily with a meal by mouth. Restart on 07/12/2017 (Patient not taking: Reported on 08/09/2017), Disp: 180 tablet, Rfl: 1 .  metFORMIN (GLUCOPHAGE) 1000 MG tablet, TAKE 1 TABLET BY MOUTH TWICE DAILY WITH MEALS (Patient not taking: Reported on 08/09/2017), Disp: 60 tablet, Rfl: 1   Review of Systems  Constitutional: Negative for appetite change, chills, diaphoresis, fatigue, fever and unexpected weight change.  HENT: Negative for congestion, dental problem, drooling, ear pain, facial swelling, hearing loss, mouth sores, sneezing, sore throat, trouble swallowing and voice change.   Eyes: Negative for pain, discharge, redness, itching and visual disturbance.  Respiratory: Negative for cough, choking, shortness of breath and wheezing.   Cardiovascular: Negative for chest pain, palpitations and leg swelling.  Gastrointestinal: Negative for abdominal pain, blood in stool, constipation, diarrhea and vomiting.  Endocrine: Negative for cold intolerance, heat intolerance and polydipsia.  Genitourinary: Negative for decreased urine volume, dysuria and hematuria.  Musculoskeletal: Negative for arthralgias, back pain and gait problem.  Skin: Negative for rash.  Allergic/Immunologic: Negative for environmental allergies.  Neurological: Negative for seizures, syncope, light-headedness and headaches.  Hematological: Negative for adenopathy.  Psychiatric/Behavioral: Negative for agitation, dysphoric mood and suicidal ideas. The  patient is not nervous/anxious.     Per HPI unless specifically indicated above     Objective:    BP 132/72 (BP Location: Left Arm, Patient Position: Sitting, Cuff Size: Normal)   Pulse 61   Temp 97.9 F (36.6 C)   Ht 5\' 2"  (1.575 m)   Wt 146 lb 8 oz (66.5 kg)   SpO2 100%   BMI 26.80 kg/m   Wt Readings from Last 3 Encounters:  08/09/17 146 lb 8 oz (66.5 kg)  07/19/17 148 lb (67.1 kg)  07/10/17 145 lb  15.1 oz (66.2 kg)    Physical Exam  Constitutional: She is oriented to person, place, and time. She appears well-developed and well-nourished.  HENT:  Head: Normocephalic and atraumatic.  Neck: Neck supple.  Cardiovascular: Normal rate and regular rhythm.  Pulmonary/Chest: Effort normal and breath sounds normal.  Abdominal: Soft. Bowel sounds are normal. She exhibits no mass. There is no hepatosplenomegaly. There is no tenderness.  Musculoskeletal: She exhibits no edema.  Lymphadenopathy:    She has no cervical adenopathy.  Neurological: She is alert and oriented to person, place, and time.  Skin: Skin is warm and dry.  Psychiatric: She has a normal mood and affect. Her behavior is normal.  Vitals reviewed.   Results for orders placed or performed during the hospital encounter of 08/02/17  TSH  Result Value Ref Range   TSH 6.475 (H) 0.350 - 4.500 uIU/mL  Comprehensive metabolic panel  Result Value Ref Range   Sodium 136 135 - 145 mmol/L   Potassium 3.7 3.5 - 5.1 mmol/L   Chloride 101 101 - 111 mmol/L   CO2 29 22 - 32 mmol/L   Glucose, Bld 113 (H) 65 - 99 mg/dL   BUN 9 6 - 20 mg/dL   Creatinine, Ser 0.69 0.44 - 1.00 mg/dL   Calcium 9.2 8.9 - 10.3 mg/dL   Total Protein 7.0 6.5 - 8.1 g/dL   Albumin 3.5 3.5 - 5.0 g/dL   AST 13 (L) 15 - 41 U/L   ALT 12 (L) 14 - 54 U/L   Alkaline Phosphatase 112 38 - 126 U/L   Total Bilirubin 0.4 0.3 - 1.2 mg/dL   GFR calc non Af Amer >60 >60 mL/min   GFR calc Af Amer >60 >60 mL/min   Anion gap 6 5 - 15  Lipid Profile  Result Value Ref Range   Cholesterol 134 0 - 200 mg/dL   Triglycerides 59 <150 mg/dL   HDL 48 >40 mg/dL   Total CHOL/HDL Ratio 2.8 RATIO   VLDL 12 0 - 40 mg/dL   LDL Cholesterol 74 0 - 99 mg/dL  HgB A1c  Result Value Ref Range   Hgb A1c MFr Bld 8.7 (H) 4.8 - 5.6 %   Mean Plasma Glucose 202.99 mg/dL      Assessment & Plan:   Encounter Diagnoses  Name Primary?  Marland Kitchen Uncontrolled type 2 diabetes mellitus with  hyperglycemia ([place]) Yes  . Essential hypertension, benign   . Hyperlipidemia, unspecified hyperlipidemia type   . Hypothyroidism, unspecified type   . Cigarette nicotine dependence with nicotine-induced disorder   . Coronary artery disease involving native coronary artery of native heart without angina pectoris     -reviewed labs with pt -Pt to get back on your metfromin. - rx sent to [place] [facility]. -will refer pt for annual DM eye exam -counseled on smoking cessation -pt to continue with cardiology per their recommendations -pt to follow up here in  3 months.  RTO sooner prn

## 2017-08-09 NOTE — Patient Instructions (Signed)
Coping with Quitting Smoking Quitting smoking is a physical and mental challenge. You will face cravings, withdrawal symptoms, and temptation. Before quitting, work with your health care provider to make a plan that can help you cope. Preparation can help you quit and keep you from giving in. How can I cope with cravings? Cravings usually last for 5-10 minutes. If you get through it, the craving will pass. Consider taking the following actions to help you cope with cravings:  Keep your mouth busy: ? Chew sugar-free gum. ? Suck on hard candies or a straw. ? Brush your teeth.  Keep your hands and body busy: ? Immediately change to a different activity when you feel a craving. ? Squeeze or play with a ball. ? Do an activity or a hobby, like making bead jewelry, practicing needlepoint, or working with wood. ? Mix up your normal routine. ? Take a short exercise break. Go for a quick walk or run up and down stairs. ? Spend time in public places where smoking is not allowed.  Focus on doing something kind or helpful for someone else.  Call a friend or family member to talk during a craving.  Join a support group.  Call a quit line, such as 1-800-QUIT-NOW.  Talk with your health care provider about medicines that might help you cope with cravings and make quitting easier for you.  How can I deal with withdrawal symptoms? Your body may experience negative effects as it tries to get used to not having nicotine in the system. These effects are called withdrawal symptoms. They may include:  Feeling hungrier than normal.  Trouble concentrating.  Irritability.  Trouble sleeping.  Feeling depressed.  Restlessness and agitation.  Craving a cigarette.  To manage withdrawal symptoms:  Avoid places, people, and activities that trigger your cravings.  Remember why you want to quit.  Get plenty of sleep.  Avoid coffee and other caffeinated drinks. These may worsen some of your  symptoms.  How can I handle social situations? Social situations can be difficult when you are quitting smoking, especially in the first few weeks. To manage this, you can:  Avoid parties, bars, and other social situations where people might be smoking.  Avoid alcohol.  Leave right away if you have the urge to smoke.  Explain to your family and friends that you are quitting smoking. Ask for understanding and support.  Plan activities with friends or family where smoking is not an option.  What are some ways I can cope with stress? Wanting to smoke may cause stress, and stress can make you want to smoke. Find ways to manage your stress. Relaxation techniques can help. For example:  Breathe slowly and deeply, in through your nose and out through your mouth.  Listen to soothing, relaxing music.  Talk with a family member or friend about your stress.  Light a candle.  Soak in a bath or take a shower.  Think about a peaceful place.  What are some ways I can prevent weight gain? Be aware that many people gain weight after they quit smoking. However, not everyone does. To keep from gaining weight, have a plan in place before you quit and stick to the plan after you quit. Your plan should include:  Having healthy snacks. When you have a craving, it may help to: ? Eat plain popcorn, crunchy carrots, celery, or other cut vegetables. ? Chew sugar-free gum.  Changing how you eat: ? Eat small portion sizes at meals. ?   Eat 4-6 small meals throughout the day instead of 1-2 large meals a day. ? Be mindful when you eat. Do not watch television or do other things that might distract you as you eat.  Exercising regularly: ? Make time to exercise each day. If you do not have time for a long workout, do short bouts of exercise for 5-10 minutes several times a day. ? Do some form of strengthening exercise, like weight lifting, and some form of aerobic exercise, like running or  swimming.  Drinking plenty of water or other low-calorie or no-calorie drinks. Drink 6-8 glasses of water daily, or as much as instructed by your health care provider.  Summary  Quitting smoking is a physical and mental challenge. You will face cravings, withdrawal symptoms, and temptation to smoke again. Preparation can help you as you go through these challenges.  You can cope with cravings by keeping your mouth busy (such as by chewing gum), keeping your body and hands busy, and making calls to family, friends, or a helpline for people who want to quit smoking.  You can cope with withdrawal symptoms by avoiding places where people smoke, avoiding drinks with caffeine, and getting plenty of rest.  Ask your health care provider about the different ways to prevent weight gain, avoid stress, and handle social situations. This information is not intended to replace advice given to you by your health care provider. Make sure you discuss any questions you have with your health care provider. Document Released: 08/18/2016 Document Revised: 08/18/2016 Document Reviewed: 08/18/2016 Elsevier Interactive Patient Education  2018 [employer]  

## 2017-09-12 ENCOUNTER — Telehealth: Payer: Self-pay | Admitting: Cardiology

## 2017-09-12 NOTE — Telephone Encounter (Signed)
Msg routed to sche

## 2017-09-12 NOTE — Telephone Encounter (Signed)
Patient has only seen [place], [place] in the office.  Would like to know if OK to change to [place] office for routine follow up Routed to MD to OK change in provider

## 2017-09-12 NOTE — Telephone Encounter (Signed)
Pt wants to [facility] if Dr Stanford Breed could refer her to one of our Cardiologist in [place] please.It is easier for her to get to the [place].

## 2017-09-12 NOTE — Telephone Encounter (Signed)
Ok with me 

## 2017-09-12 NOTE — Telephone Encounter (Signed)
Will forward to front office staff to schedule appointment.

## 2017-09-12 NOTE — Telephone Encounter (Signed)
Patient aware MD is OK with provider change, she has no preference of MD. Will route to [facility] triage and scheduling to contact patient for appointment.

## 2017-09-13 ENCOUNTER — Encounter: Payer: Self-pay | Admitting: Cardiovascular Disease

## 2017-10-04 ENCOUNTER — Telehealth: Payer: Self-pay | Admitting: Cardiovascular Disease

## 2017-10-04 NOTE — Telephone Encounter (Signed)
LMOM for patient to call office to schedule. Needs appointment per staff message. / tg

## 2017-10-09 ENCOUNTER — Encounter: Payer: Self-pay | Admitting: Physician Assistant

## 2017-10-09 ENCOUNTER — Ambulatory Visit: Payer: Self-pay | Admitting: Physician Assistant

## 2017-10-09 ENCOUNTER — Other Ambulatory Visit (HOSPITAL_COMMUNITY)
Admission: RE | Admit: 2017-10-09 | Discharge: 2017-10-09 | Disposition: A | Discharge status: Home / Self Care | Payer: Medicaid Other | Source: Ambulatory Visit | Attending: Physician Assistant | Admitting: Physician Assistant

## 2017-10-09 ENCOUNTER — Telehealth: Payer: Self-pay | Admitting: Cardiovascular Disease

## 2017-10-09 ENCOUNTER — Ambulatory Visit: Payer: Self-pay | Admitting: Cardiology

## 2017-10-09 VITALS — BP 138/78 | HR 88 | Temp 98.1°F | Ht 62.0 in | Wt 149.2 lb

## 2017-10-09 DIAGNOSIS — E1165 Type 2 diabetes mellitus with hyperglycemia: Secondary | ICD-10-CM

## 2017-10-09 DIAGNOSIS — L03113 Cellulitis of right upper limb: Secondary | ICD-10-CM | POA: Insufficient documentation

## 2017-10-09 LAB — GLUCOSE, POCT (MANUAL RESULT ENTRY): POC Glucose: 167 mg/dl — AB (ref 70–99)

## 2017-10-09 LAB — CBC WITH DIFFERENTIAL/PLATELET
BASOS PCT: 0 %
Basophils Absolute: 0 10*3/uL (ref 0.0–0.1)
Eosinophils Absolute: 0.2 10*3/uL (ref 0.0–0.7)
Eosinophils Relative: 2 %
HCT: 33.1 % — ABNORMAL LOW (ref 36.0–46.0)
Hemoglobin: 10.7 g/dL — ABNORMAL LOW (ref 12.0–15.0)
LYMPHS ABS: 2.3 10*3/uL (ref 0.7–4.0)
Lymphocytes Relative: 19 %
MCH: 30.2 pg (ref 26.0–34.0)
MCHC: 32.3 g/dL (ref 30.0–36.0)
MCV: 93.5 fL (ref 78.0–100.0)
MONOS PCT: 9 %
Monocytes Absolute: 1.1 10*3/uL — ABNORMAL HIGH (ref 0.1–1.0)
NEUTROS ABS: 8.5 10*3/uL — AB (ref 1.7–7.7)
Neutrophils Relative %: 70 %
Platelets: 300 10*3/uL (ref 150–400)
RBC: 3.54 MIL/uL — ABNORMAL LOW (ref 3.87–5.11)
RDW: 13.3 % (ref 11.5–15.5)
WBC: 12.2 10*3/uL — ABNORMAL HIGH (ref 4.0–10.5)

## 2017-10-09 LAB — BASIC METABOLIC PANEL
ANION GAP: 11 (ref 5–15)
BUN: 7 mg/dL (ref 6–20)
CALCIUM: 8.4 mg/dL — AB (ref 8.9–10.3)
CHLORIDE: 102 mmol/L (ref 101–111)
CO2: 24 mmol/L (ref 22–32)
Creatinine, Ser: 0.65 mg/dL (ref 0.44–1.00)
GFR calc non Af Amer: >60 mL/min (ref >60)
Glucose, Bld: 162 mg/dL — ABNORMAL HIGH (ref 65–99)
POTASSIUM: 3.7 mmol/L (ref 3.5–5.1)
Sodium: 137 mmol/L (ref 135–145)

## 2017-10-09 LAB — HEMOGLOBIN A1C
Hgb A1c MFr Bld: 10 % — ABNORMAL HIGH (ref 4.8–5.6)
MEAN PLASMA GLUCOSE: 240.3 mg/dL

## 2017-10-09 MED ORDER — AMOXICILLIN-POT CLAVULANATE 875-125 MG PO TABS: 1.0000 | ORAL_TABLET | Freq: Two times a day (BID) | ORAL | 0 refills | Status: DC

## 2017-10-09 MED ORDER — CEFTRIAXONE SODIUM 1 G IJ SOLR
1.0000 g | Freq: Once | INTRAMUSCULAR | Status: AC
  Administered 2017-10-09: 1 g via INTRAMUSCULAR

## 2017-10-09 NOTE — Progress Notes (Signed)
BP 138/78 (BP Location: Left Arm, Patient Position: Sitting, Cuff Size: Normal)   Pulse 88   Temp 98.1 F (36.7 C)   Ht 5\' 2"  (1.575 m)   Wt 149 lb 4 oz (67.7 kg)   SpO2 99%   BMI 27.30 kg/m    Subjective:    Patient ID: Lisa Jenkins, female    DOB: 1950-11-14, 67 y.o.   MRN: [id number]  HPI: Lisa Jenkins is a 67 y.o. female presenting on 10/09/2017 for Edema (bilateral feet, and L arm. started this morning.)   HPI   Chief Complaint  Patient presents with  . Edema    bilateral feet, and R arm. started this morning.    R arm started swelling about a week.  Feet just started swelling this morning.  Pt denies fall or other injury to the R arm.  She says she feels like she might have had some fever but she says she never did when she checked her temperature.    Pt concerned this is related to the stent she had placed in early November.   She hasn't noticed her bs being too high lately.   Relevant past medical, surgical, family and social history reviewed and updated as indicated. Interim medical history since our last visit reviewed. Allergies and medications reviewed and updated.   Current Outpatient Medications:  .  amLODipine (NORVASC) 5 MG tablet, Take 1 tablet (5 mg total) daily by mouth., Disp: 90 tablet, Rfl: 1 .  aspirin 81 MG chewable tablet, Chew 81 mg by mouth daily. , Disp: , Rfl:  .  atorvastatin (LIPITOR) 80 MG tablet, Take 1 tablet (80 mg total) daily at 6 PM by mouth., Disp: 90 tablet, Rfl: 1 .  clopidogrel (PLAVIX) 75 MG tablet, Take 1 tablet (75 mg total) daily with breakfast by mouth., Disp: 90 tablet, Rfl: 1 .  insulin glargine (LANTUS) 100 UNIT/ML injection, Inject 15 Units into the skin at bedtime. , Disp: , Rfl:  .  levothyroxine (SYNTHROID, LEVOTHROID) 25 MCG tablet, Take 1 tablet (25 mcg total) daily before breakfast by mouth., Disp: 90 tablet, Rfl: 1 .  lisinopril (PRINIVIL,ZESTRIL) 20 MG tablet, Take 2 tablets (40 mg total) daily by mouth., Disp: 180  tablet, Rfl: 1 .  metFORMIN (GLUCOPHAGE) 1000 MG tablet, Take 1 tablet (1,000 mg total) by mouth 2 (two) times daily with a meal. Restart on 07/12/2017, Disp: 180 tablet, Rfl: 1 .  metoprolol tartrate (LOPRESSOR) 25 MG tablet, Take 1 tablet (25 mg total) 2 (two) times daily by mouth. (Patient not taking: Reported on 10/09/2017), Disp: 180 tablet, Rfl: 1   Review of Systems  Constitutional: Positive for chills. Negative for appetite change, diaphoresis, fatigue, fever and unexpected weight change.  HENT: Negative for congestion, dental problem, drooling, ear pain, facial swelling, hearing loss, mouth sores, sneezing, sore throat, trouble swallowing and voice change.   Eyes: Negative for pain, discharge, redness, itching and visual disturbance.  Respiratory: Negative for cough, choking, shortness of breath and wheezing.   Cardiovascular: Positive for leg swelling. Negative for chest pain and palpitations.  Gastrointestinal: Negative for abdominal pain, blood in stool, constipation, diarrhea and vomiting.  Endocrine: Negative for cold intolerance, heat intolerance and polydipsia.  Genitourinary: Negative for decreased urine volume, dysuria and hematuria.  Musculoskeletal: Negative for arthralgias, back pain and gait problem.  Skin: Negative for rash.  Allergic/Immunologic: Negative for environmental allergies.  Neurological: Negative for seizures, syncope, light-headedness and headaches.  Hematological: Negative for adenopathy.  Psychiatric/Behavioral:  Negative for agitation, dysphoric mood and suicidal ideas. The patient is not nervous/anxious.     Per HPI unless specifically indicated above     Objective:    BP 138/78 (BP Location: Left Arm, Patient Position: Sitting, Cuff Size: Normal)   Pulse 88   Temp 98.1 F (36.7 C)   Ht 5\' 2"  (1.575 m)   Wt 149 lb 4 oz (67.7 kg)   SpO2 99%   BMI 27.30 kg/m   Wt Readings from Last 3 Encounters:  10/09/17 149 lb 4 oz (67.7 kg)  08/09/17 146 lb 8  oz (66.5 kg)  07/19/17 148 lb (67.1 kg)    Physical Exam  Constitutional: She is oriented to person, place, and time. She appears well-developed and well-nourished.  HENT:  Head: Normocephalic and atraumatic.  Neck: Neck supple.  Cardiovascular: Normal rate and regular rhythm.  Pulmonary/Chest: Effort normal and breath sounds normal.  Abdominal: Soft. Bowel sounds are normal. She exhibits no mass. There is no hepatosplenomegaly. There is no tenderness.  Musculoskeletal: She exhibits edema (trace - 1+ pretibial edema bilaterally.  no pedal edema.  ).       Right wrist: She exhibits decreased range of motion, tenderness and swelling. She exhibits no bony tenderness.  R wrist with mild non-localized swelling.  There is redness and warmth.  Pt has significant pain with passive flexion and extension of the R wrist.  She is self splinting the wrist.  + radial pulse.  R hand and elbow are without swelling and tenderness.   Lymphadenopathy:    She has no cervical adenopathy.  Neurological: She is alert and oriented to person, place, and time.  Skin: Skin is warm and dry.  Psychiatric: She has a normal mood and affect. Her behavior is normal.  Vitals reviewed.  fbs 167    Assessment & Plan:    Encounter Diagnoses  Name Primary?  . Cellulitis of right upper extremity Yes  . Uncontrolled type 2 diabetes mellitus with hyperglycemia ([place])     -discussed with pt that it is unlikely related to stent since that was over 3 months ago and she is just now having the problem -pt given rocephin in office.  She is to go for labs and xray when she leaves office today -rx augmentin that she is to start today -pt is given [facility] -pt is to follow up for recheck in 48 hours.  She is counseled to go to ER if she worsens or develops fever.

## 2017-10-09 NOTE — Telephone Encounter (Signed)
[email or web address].pt seen [facility],given antibiotic and has f/u apt in 2 days.has march apt with cardiologist.I am not sure why patient called here

## 2017-10-09 NOTE — Telephone Encounter (Signed)
New message    Pt c/o swelling: STAT is pt has developed SOB within 24 hours  1) How much weight have you gained and in what time span? n/a  2) If swelling, where is the swelling located? Right hand  3) Are you currently taking a fluid pill? no  4) Are you currently SOB? no  5) Do you have a log of your daily weights (if so, list)? n/a  6) Have you gained 3 pounds in a day or 5 pounds in a week? n/a  7) Have you traveled recently? no

## 2017-10-10 ENCOUNTER — Telehealth: Payer: Self-pay | Admitting: Student

## 2017-10-10 ENCOUNTER — Ambulatory Visit (HOSPITAL_COMMUNITY)
Admission: RE | Admit: 2017-10-10 | Discharge: 2017-10-10 | Disposition: A | Discharge status: Home / Self Care | Payer: Medicaid Other | Source: Ambulatory Visit | Attending: Physician Assistant | Admitting: Physician Assistant

## 2017-10-10 DIAGNOSIS — L03113 Cellulitis of right upper limb: Secondary | ICD-10-CM | POA: Insufficient documentation

## 2017-10-10 DIAGNOSIS — M11231 Other chondrocalcinosis, right wrist: Secondary | ICD-10-CM | POA: Insufficient documentation

## 2017-10-10 NOTE — Telephone Encounter (Signed)
-----   Message from Soyla Dryer, [place] sent at 10/09/2017  7:45 PM EST ----- Please call pt and remind her she had xray ordered of her R wrist that she needs to get done

## 2017-10-10 NOTE — Telephone Encounter (Signed)
Called pt and left v/m reminding her that xray has been ordered and is to go to [facility]- [facility]ology to get it done.

## 2017-10-10 NOTE — Telephone Encounter (Signed)
Pt called back and spoke with patient coordinator and notified her that she heard v/m left by LPN asking her to get xray done. Pt states she will get it done before her appointment tomorrow 10-11-17.

## 2017-10-11 ENCOUNTER — Emergency Department (HOSPITAL_COMMUNITY)
Admission: EM | Admit: 2017-10-11 | Discharge: 2017-10-11 | Disposition: A | Discharge status: Home / Self Care | Payer: Medicaid Other | Attending: Emergency Medicine | Admitting: Emergency Medicine

## 2017-10-11 ENCOUNTER — Ambulatory Visit: Payer: Self-pay | Admitting: Physician Assistant

## 2017-10-11 ENCOUNTER — Encounter: Payer: Self-pay | Admitting: Physician Assistant

## 2017-10-11 ENCOUNTER — Other Ambulatory Visit: Payer: Self-pay

## 2017-10-11 ENCOUNTER — Encounter (HOSPITAL_COMMUNITY): Payer: Self-pay | Admitting: Emergency Medicine

## 2017-10-11 VITALS — BP 136/64 | HR 85 | Temp 100.2°F | Ht 62.0 in

## 2017-10-11 DIAGNOSIS — R2231 Localized swelling, mass and lump, right upper limb: Secondary | ICD-10-CM | POA: Diagnosis present

## 2017-10-11 DIAGNOSIS — Z7902 Long term (current) use of antithrombotics/antiplatelets: Secondary | ICD-10-CM | POA: Diagnosis not present

## 2017-10-11 DIAGNOSIS — Z794 Long term (current) use of insulin: Secondary | ICD-10-CM | POA: Diagnosis not present

## 2017-10-11 DIAGNOSIS — I10 Essential (primary) hypertension: Secondary | ICD-10-CM | POA: Diagnosis not present

## 2017-10-11 DIAGNOSIS — L03113 Cellulitis of right upper limb: Secondary | ICD-10-CM

## 2017-10-11 DIAGNOSIS — F17200 Nicotine dependence, unspecified, uncomplicated: Secondary | ICD-10-CM | POA: Insufficient documentation

## 2017-10-11 DIAGNOSIS — E039 Hypothyroidism, unspecified: Secondary | ICD-10-CM | POA: Diagnosis not present

## 2017-10-11 DIAGNOSIS — I252 Old myocardial infarction: Secondary | ICD-10-CM | POA: Insufficient documentation

## 2017-10-11 DIAGNOSIS — E1165 Type 2 diabetes mellitus with hyperglycemia: Secondary | ICD-10-CM

## 2017-10-11 DIAGNOSIS — Z7982 Long term (current) use of aspirin: Secondary | ICD-10-CM | POA: Insufficient documentation

## 2017-10-11 DIAGNOSIS — M19031 Primary osteoarthritis, right wrist: Secondary | ICD-10-CM | POA: Insufficient documentation

## 2017-10-11 DIAGNOSIS — E113299 Type 2 diabetes mellitus with mild nonproliferative diabetic retinopathy without macular edema, unspecified eye: Secondary | ICD-10-CM | POA: Diagnosis not present

## 2017-10-11 DIAGNOSIS — M19039 Primary osteoarthritis, unspecified wrist: Secondary | ICD-10-CM

## 2017-10-11 DIAGNOSIS — Z79899 Other long term (current) drug therapy: Secondary | ICD-10-CM | POA: Diagnosis not present

## 2017-10-11 LAB — URIC ACID: Uric Acid, Serum: 5.3 mg/dL (ref 2.3–6.6)

## 2017-10-11 LAB — CBC WITH DIFFERENTIAL/PLATELET
BASOS ABS: 0 10*3/uL (ref 0.0–0.1)
Basophils Relative: 0 %
EOS ABS: 0.1 10*3/uL (ref 0.0–0.7)
EOS PCT: 1 %
HCT: 29.9 % — ABNORMAL LOW (ref 36.0–46.0)
Hemoglobin: 9.8 g/dL — ABNORMAL LOW (ref 12.0–15.0)
LYMPHS ABS: 1.2 10*3/uL (ref 0.7–4.0)
Lymphocytes Relative: 16 %
MCH: 30.3 pg (ref 26.0–34.0)
MCHC: 32.8 g/dL (ref 30.0–36.0)
MCV: 92.6 fL (ref 78.0–100.0)
Monocytes Absolute: 1.1 10*3/uL — ABNORMAL HIGH (ref 0.1–1.0)
Monocytes Relative: 15 %
Neutro Abs: 5 10*3/uL (ref 1.7–7.7)
Neutrophils Relative %: 68 %
PLATELETS: 283 10*3/uL (ref 150–400)
RBC: 3.23 MIL/uL — AB (ref 3.87–5.11)
RDW: 13.3 % (ref 11.5–15.5)
WBC: 7.4 10*3/uL (ref 4.0–10.5)

## 2017-10-11 LAB — BASIC METABOLIC PANEL
Anion gap: 11 (ref 5–15)
BUN: 7 mg/dL (ref 6–20)
CO2: 22 mmol/L (ref 22–32)
CREATININE: 0.6 mg/dL (ref 0.44–1.00)
Calcium: 8.4 mg/dL — ABNORMAL LOW (ref 8.9–10.3)
Chloride: 104 mmol/L (ref 101–111)
GFR calc Af Amer: >60 mL/min (ref >60)
Glucose, Bld: 204 mg/dL — ABNORMAL HIGH (ref 65–99)
Potassium: 3.5 mmol/L (ref 3.5–5.1)
SODIUM: 137 mmol/L (ref 135–145)

## 2017-10-11 LAB — GLUCOSE, POCT (MANUAL RESULT ENTRY): POC Glucose: 137 mg/dl — AB (ref 70–99)

## 2017-10-11 LAB — I-STAT CG4 LACTIC ACID, ED: LACTIC ACID, VENOUS: 1 mmol/L (ref 0.5–1.9)

## 2017-10-11 LAB — CBG MONITORING, ED: Glucose-Capillary: 201 mg/dL — ABNORMAL HIGH (ref 65–99)

## 2017-10-11 MED ORDER — SODIUM CHLORIDE 0.9 % IV BOLUS (SEPSIS)
500.0000 mL | Freq: Once | INTRAVENOUS | Status: AC
  Administered 2017-10-11: 500 mL via INTRAVENOUS

## 2017-10-11 MED ORDER — ACETAMINOPHEN 325 MG PO TABS
650.0000 mg | ORAL_TABLET | Freq: Once | ORAL | Status: AC
  Administered 2017-10-11: 650 mg via ORAL
  Filled 2017-10-11: qty 2

## 2017-10-11 NOTE — Discharge Instructions (Addendum)
The testing and appearance of your wrist, is most consistent with arthritis.  There is not appear to be any sign for infection, of the soft tissues, or in the joint of the right wrist.  Take ibuprofen 400 mg 3 times a day with meals for 1 week.  You can continue taking the antibiotic, for the very small possibility of infection.  To help the discomfort, elevate your right wrist above your heart as much as possible.  Also use heat on it 3 or 4 times a day, for 30 or 60 minutes.  Return here if needed for problems.

## 2017-10-11 NOTE — ED Triage Notes (Signed)
Right hand pain, and swelling, saw PCP on Tuesday, was given antibiotic and steroids. Pt has a fever

## 2017-10-11 NOTE — ED Provider Notes (Signed)
[facility] EMERGENCY DEPARTMENT Provider Note   CSN: [id number] Arrival date & time: 10/11/17  1414     History   Chief Complaint Chief Complaint  Patient presents with  . hand swelling    HPI Lisa Jenkins is a 67 y.o. female.  She presents for evaluation of right hand swelling present for about a week.  She saw her PCP, on 10/09/17 and was treated with IM Rocephin and started on Augmentin which she is taking twice a day.  She saw her provider again today who felt that there was more redness and swelling, and had concern for poorly treated diabetes, therefore sent her here for further evaluation.  Patient denies trauma.  She denies similar problem.  She has developed a low-grade fever at home.  She denies nausea, vomiting, weakness or dizziness.  There are no other known modifying factors.  HPI  Past Medical History:  Diagnosis Date  . Diabetes mellitus without complication ([place])   . High cholesterol   . Hypertension     Patient Active Problem List   Diagnosis Date Noted  . NSTEMI (non-ST elevated myocardial infarction) ([place]) 07/08/2017  . Community acquired pneumonia of right upper lobe of lung ([place])   . Chest pain 07/28/2016  . 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 Hypothyroidism 10/21/2015  . Uncontrolled type 2 diabetes mellitus with complication ([place]) 50/53/9767  . Type 2 diabetes mellitus with complication ([employer]) 34/19/3790  . Hyperlipidemia 07/21/2015  . Essential hypertension, benign 07/21/2015  . Cigarette nicotine dependence, uncomplicated 24/05/7352  . Type 2 diabetes mellitus with retinopathy ([place]) 07/21/2015  . Proteinuria 07/21/2015  . Thyroid activity decreased 07/21/2015    Past Surgical History:  Procedure Laterality Date  . BUNIONECTOMY Bilateral   . CORONARY STENT INTERVENTION N/A 07/09/2017   Procedure: CORONARY STENT INTERVENTION;  Surgeon: Belva Crome, MD;  Location: [place] [facility];  Service: Cardiovascular;  Laterality: N/A;  . LEFT HEART  CATH AND CORONARY ANGIOGRAPHY N/A 07/09/2017   Procedure: LEFT HEART CATH AND CORONARY ANGIOGRAPHY;  Surgeon: Belva Crome, MD;  Location: [place] [facility];  Service: Cardiovascular;  Laterality: N/A;    OB History    Gravida Para Term Preterm AB Living             0   SAB TAB Ectopic Multiple Live Births                   Home Medications    Prior to Admission medications   Medication Sig Start Date End Date Taking? Authorizing Provider  amLODipine (NORVASC) 5 MG tablet Take 1 tablet (5 mg total) daily by mouth. 07/17/17   Soyla Dryer, PA-C  amoxicillin-clavulanate (AUGMENTIN) 875-125 MG tablet Take 1 tablet by mouth 2 (two) times daily. 10/09/17   Soyla Dryer, PA-C  aspirin 81 MG chewable tablet Chew 81 mg by mouth daily.     [provider]  atorvastatin (LIPITOR) 80 MG tablet Take 1 tablet (80 mg total) daily at 6 PM by mouth. 07/17/17   Soyla Dryer, PA-C  clopidogrel (PLAVIX) 75 MG tablet Take 1 tablet (75 mg total) daily with breakfast by mouth. 07/17/17   Soyla Dryer, PA-C  insulin glargine (LANTUS) 100 UNIT/ML injection Inject 15 Units into the skin at bedtime.     [provider]  levothyroxine (SYNTHROID, LEVOTHROID) 25 MCG tablet Take 1 tablet (25 mcg total) daily before breakfast by mouth. 07/17/17   Soyla Dryer, PA-C  lisinopril (PRINIVIL,ZESTRIL) 20 MG tablet  Take 2 tablets (40 mg total) daily by mouth. 07/17/17   Soyla Dryer, PA-C  metFORMIN (GLUCOPHAGE) 1000 MG tablet Take 1 tablet (1,000 mg total) by mouth 2 (two) times daily with a meal. Restart on 07/12/2017 08/09/17   Soyla Dryer, PA-C  metoprolol tartrate (LOPRESSOR) 25 MG tablet Take 1 tablet (25 mg total) 2 (two) times daily by mouth. 07/17/17   Soyla Dryer, PA-C    Family History Family History  Problem Relation Age of Onset  . Diabetes Mother   . Heart disease Mother   . Heart disease Father   . Diabetes Sister   . Heart disease Sister   . Diabetes  Brother   . Heart disease Brother   . Diabetes Brother   . Heart disease Brother   . Diabetes Brother   . Heart disease Brother   . Diabetes Brother   . Heart disease Brother   . Diabetes Brother   . Heart disease Brother     Social History Social History   Tobacco Use  . Smoking status: Current Every Day Smoker    Packs/day: 0.25    Years: 46.00    Pack years: 11.50  . Smokeless tobacco: Never Used  Substance Use Topics  . Alcohol use: No    Alcohol/week: 0.0 oz  . Drug use: No     Allergies   Patient has no known allergies.   Review of Systems Review of Systems  All other systems reviewed and are negative.    Physical Exam Updated Vital Signs BP 130/70   Pulse 67   Temp 98.9 F (37.2 C) (Oral)   Resp 16   Ht 5\' 2"  (1.575 m)   Wt 67.6 kg (149 lb)   SpO2 98%   BMI 27.25 kg/m   Physical Exam  Constitutional: She is oriented to person, place, and time. She appears well-developed and well-nourished.  HENT:  Head: Normocephalic and atraumatic.  Eyes: Conjunctivae and EOM are normal. Pupils are equal, round, and reactive to light.  Neck: Normal range of motion and phonation normal. Neck supple.  Cardiovascular: Normal rate and regular rhythm.  Pulmonary/Chest: Effort normal and breath sounds normal. She exhibits no tenderness.  Musculoskeletal:  Right wrist with mild swelling, extending to the dorsum of the hand.  Somewhat limited motion of extension flexion of the right wrist secondary to pain.  No significant forearm swelling.  Normal range of motion elbow.  No palpable epitrochlear adenopathy of the elbow.  She is neurovascularly intact distally in the right hand.  Neurological: She is alert and oriented to person, place, and time. She exhibits normal muscle tone.  Skin: Skin is warm and dry.  Psychiatric: She has a normal mood and affect. Her behavior is normal. Judgment and thought content normal.  Nursing note and vitals reviewed.    ED Treatments  / Results  Labs (all labs ordered are listed, but only abnormal results are displayed) Labs Reviewed  BASIC METABOLIC PANEL - Abnormal; Notable for the following components:      Result Value   Glucose, Bld 204 (*)    Calcium 8.4 (*)    All other components within normal limits  CBC WITH DIFFERENTIAL/PLATELET - Abnormal; Notable for the following components:   RBC 3.23 (*)    Hemoglobin 9.8 (*)    HCT 29.9 (*)    Monocytes Absolute 1.1 (*)    All other components within normal limits  CBG MONITORING, ED - Abnormal; Notable for the following components:  Glucose-Capillary 201 (*)    All other components within normal limits  CULTURE, BLOOD (ROUTINE X 2)  CULTURE, BLOOD (ROUTINE X 2)  BODY FLUID CULTURE  GRAM STAIN  URIC ACID  SYNOVIAL CELL COUNT + DIFF, W/ CRYSTALS  GLUCOSE, BODY FLUID OTHER  I-STAT CG4 LACTIC ACID, ED    EKG  EKG Interpretation None       Radiology Dg Wrist Complete Right  Result Date: 10/11/2017 CLINICAL DATA:  Right wrist pain and swelling for the past week. No injury. EXAM: RIGHT WRIST - COMPLETE 3+ VIEW COMPARISON:  None. FINDINGS: No acute fracture or malalignment. Faint chondrocalcinosis of the TFCC. No bony erosions or periostitis. Joint spaces are preserved. Osteopenia. IMPRESSION: 1. 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 No acute osseous abnormality. Electronically Signed   By: Titus Dubin M.D.   On: 10/11/2017 08:09    Procedures .Joint Aspiration/Arthrocentesis Date/Time: 10/11/2017 8:50 PM Performed by: Daleen Bo, MD Authorized by: Daleen Bo, MD   Consent:    Consent obtained:  Verbal   Consent given by:  Patient   Risks discussed:  Bleeding and pain   Alternatives discussed:  Delayed treatment Location:    Location:  Wrist   Wrist:  R radiocarpal Anesthesia (see MAR for exact dosages):    Anesthesia method:  None Procedure details:    Preparation: Patient  was prepped and draped in usual sterile fashion     Needle gauge:  22 G   Ultrasound guidance: no     Approach:  Posterior   Aspirate amount:  None   Steroid injected: no     Specimen collected: no   Post-procedure details:    Dressing:  Adhesive bandage   Patient tolerance of procedure:  Tolerated well, no immediate complications   (including critical care time)  Medications Ordered in ED Medications  sodium chloride 0.9 % bolus 500 mL (500 mLs Intravenous New Bag/Given 10/11/17 1915)  acetaminophen (TYLENOL) tablet 650 mg (650 mg Oral Given 10/11/17 1918)     Initial Impression / Assessment and Plan / ED Course  I have reviewed the triage vital signs and the nursing notes.  Pertinent labs & imaging results that were available during my care of the patient were reviewed by me and considered in my medical decision making (see chart for details).       Patient Vitals for the past 24 hrs:  BP Temp Temp src Pulse Resp SpO2 Height Weight  10/11/17 2030 130/70 - - 67 - 98 % - -  10/11/17 2013 (!) 141/87 - - 74 16 98 % - -  10/11/17 1830 139/62 - - 75 - 96 % - -  10/11/17 1803 138/63 98.9 F (37.2 C) Oral 72 16 96 % - -  10/11/17 1426 128/66 (!) 100.4 F (38 C) Oral 84 18 96 % 5\' 2"  (1.575 m) 67.6 kg (149 lb)    8:50 PM Reevaluation with update and discussion. After initial assessment and treatment, an updated evaluation reveals no change in clinical status.  Findings discussed with patient and family member, all questions answered. Daleen Bo       Final Clinical Impressions(s) / ED Diagnoses   Final diagnoses:  Wrist arthritis    Right wrist swelling, without appearance of cellulitis.  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  Doubt septic arthritis, cellulitis, anabolic instability or impending vascular collapse.  Nursing Notes Reviewed/ Care Coordinated Applicable Imaging Reviewed Interpretation of Laboratory Data incorporated into ED treatment  The patient appears reasonably screened  and/or stabilized for discharge and I doubt any other medical condition or other [facility] requiring further screening, evaluation, or treatment in the ED at this time prior to discharge.  Plan: Home Medications-continue usual medications, use ibuprofen 3 times daily; Home Treatments-rest, heat, elevation; return here if the recommended treatment, does not improve the symptoms; Recommended follow up-PCP checkup 1 week and as needed   ED Discharge Orders    None       Daleen Bo, MD 10/11/17 2055

## 2017-10-11 NOTE — Progress Notes (Signed)
BP 136/64 (BP Location: Left Arm, Patient Position: Sitting, Cuff Size: Normal)   Pulse 85   Temp 100.2 F (37.9 C) (Other (Comment))   Ht 5\' 2"  (1.575 m)   SpO2 96%   BMI 27.30 kg/m    Subjective:    Patient ID: Lisa Jenkins, female    DOB: [birth year], 67 y.o.   MRN: [id number]  HPI: Lisa Jenkins is a 67 y.o. female presenting on 10/11/2017 for Cellulitis   HPI   Pt was seen on 10/09/17 and diagnosed with cellulitis R wrist.  She was given rocephin in office and started on augmentin which she has been taking.  Cbc was checked which revealed elevated white count.  Xray done and reviewed.  a1c was also checked- 10.0  Pt in today for follow up.  She says she feels worse.  She is now running a fever and she feels very weak.  Pt also states trouble walking due to buttock soreness where she received her rocephin 2 days ago.  Relevant past medical, surgical, family and social history reviewed and updated as indicated. Interim medical history since our last visit reviewed. Allergies and medications reviewed and updated.   Current Outpatient Medications:  .  amLODipine (NORVASC) 5 MG tablet, Take 1 tablet (5 mg total) daily by mouth., Disp: 90 tablet, Rfl: 1 .  amoxicillin-clavulanate (AUGMENTIN) 875-125 MG tablet, Take 1 tablet by mouth 2 (two) times daily., Disp: 20 tablet, Rfl: 0 .  aspirin 81 MG chewable tablet, Chew 81 mg by mouth daily. , Disp: , Rfl:  .  atorvastatin (LIPITOR) 80 MG tablet, Take 1 tablet (80 mg total) daily at 6 PM by mouth., Disp: 90 tablet, Rfl: 1 .  clopidogrel (PLAVIX) 75 MG tablet, Take 1 tablet (75 mg total) daily with breakfast by mouth., Disp: 90 tablet, Rfl: 1 .  insulin glargine (LANTUS) 100 UNIT/ML injection, Inject 15 Units into the skin at bedtime. , Disp: , Rfl:  .  levothyroxine (SYNTHROID, LEVOTHROID) 25 MCG tablet, Take 1 tablet (25 mcg total) daily before breakfast by mouth., Disp: 90 tablet, Rfl: 1 .  lisinopril (PRINIVIL,ZESTRIL) 20 MG tablet,  Take 2 tablets (40 mg total) daily by mouth., Disp: 180 tablet, Rfl: 1 .  metFORMIN (GLUCOPHAGE) 1000 MG tablet, Take 1 tablet (1,000 mg total) by mouth 2 (two) times daily with a meal. Restart on 07/12/2017, Disp: 180 tablet, Rfl: 1 .  metoprolol tartrate (LOPRESSOR) 25 MG tablet, Take 1 tablet (25 mg total) 2 (two) times daily by mouth., Disp: 180 tablet, Rfl: 1   Review of Systems  Per HPI unless specifically indicated above     Objective:    BP 136/64 (BP Location: Left Arm, Patient Position: Sitting, Cuff Size: Normal)   Pulse 85   Temp 100.2 F (37.9 C) (Other (Comment))   Ht 5\' 2"  (1.575 m)   SpO2 96%   BMI 27.30 kg/m   Wt Readings from Last 3 Encounters:  10/09/17 149 lb 4 oz (67.7 kg)  08/09/17 146 lb 8 oz (66.5 kg)  07/19/17 148 lb (67.1 kg)    Physical Exam  Constitutional: She is oriented to person, place, and time. She appears well-developed and well-nourished.  Pt appears weak and frail today.  She is using a cane to ambulate and requires assistance to get onto exam table.  This is new from 2 days ago.   HENT:  Head: Normocephalic and atraumatic.  Neck: Neck supple.  Cardiovascular: Normal rate and regular rhythm.  Pulmonary/Chest: Effort normal and breath sounds normal. No respiratory distress. She has no wheezes. She has no rales.  Abdominal: Soft. There is no tenderness.  Musculoskeletal: She exhibits no edema.       Right wrist: She exhibits decreased range of motion, tenderness and swelling. She exhibits no bony tenderness.  Swelling R wrist increased from 2 days ago.  Continues to be painful with passive flexion/extension.  + erythema.  + hot to touch  Neurological: She is alert and oriented to person, place, and time.  Skin: Skin is warm and dry.  Psychiatric: She has a normal mood and affect. Her behavior is normal.  Nursing note and vitals reviewed.    fbs 137  Results for orders placed or performed during the hospital encounter of 25/00/37  Basic  metabolic panel  Result Value Ref Range   Sodium 137 135 - 145 mmol/L   Potassium 3.7 3.5 - 5.1 mmol/L   Chloride 102 101 - 111 mmol/L   CO2 24 22 - 32 mmol/L   Glucose, Bld 162 (H) 65 - 99 mg/dL   BUN 7 6 - 20 mg/dL   Creatinine, Ser 0.65 0.44 - 1.00 mg/dL   Calcium 8.4 (L) 8.9 - 10.3 mg/dL   GFR calc non Af Amer >60 >60 mL/min   GFR calc Af Amer >60 >60 mL/min   Anion gap 11 5 - 15  CBC w/Diff/Platelet  Result Value Ref Range   WBC 12.2 (H) 4.0 - 10.5 K/uL   RBC 3.54 (L) 3.87 - 5.11 MIL/uL   Hemoglobin 10.7 (L) 12.0 - 15.0 g/dL   HCT 33.1 (L) 36.0 - 46.0 %   MCV 93.5 78.0 - 100.0 fL   MCH 30.2 26.0 - 34.0 pg   MCHC 32.3 30.0 - 36.0 g/dL   RDW 13.3 11.5 - 15.5 %   Platelets 300 150 - 400 K/uL   Neutrophils Relative % 70 %   Neutro Abs 8.5 (H) 1.7 - 7.7 K/uL   Lymphocytes Relative 19 %   Lymphs Abs 2.3 0.7 - 4.0 K/uL   Monocytes Relative 9 %   Monocytes Absolute 1.1 (H) 0.1 - 1.0 K/uL   Eosinophils Relative 2 %   Eosinophils Absolute 0.2 0.0 - 0.7 K/uL   Basophils Relative 0 %   Basophils Absolute 0.0 0.0 - 0.1 K/uL  Hemoglobin A1c  Result Value Ref Range   Hgb A1c MFr Bld 10.0 (H) 4.8 - 5.6 %   Mean Plasma Glucose 240.3 mg/dL      Assessment & Plan:    Encounter Diagnoses  Name Primary?  . Cellulitis of right upper extremity Yes  . Uncontrolled type 2 diabetes mellitus with hyperglycemia ([place])      -reviewed labs with pt -discussed with pt that in light of worsening cellulitis and uncontrolled diabetes, she may need to be admitted to the hospital for treatment.  She is to go to ER when she leaves the office today. Her friend that drove her today says she will take her right over.  ER faxed to notify of pt being sent

## 2017-10-15 ENCOUNTER — Other Ambulatory Visit: Payer: Self-pay | Admitting: Physician Assistant

## 2017-10-15 MED ORDER — LISINOPRIL 20 MG PO TABS: 40.0000 mg | ORAL_TABLET | Freq: Every day | ORAL | 1 refills | Status: DC

## 2017-10-16 LAB — CULTURE, BLOOD (ROUTINE X 2)
Culture: NO GROWTH
Culture: NO GROWTH
SPECIAL REQUESTS: ADEQUATE
Special Requests: ADEQUATE

## 2017-10-17 ENCOUNTER — Other Ambulatory Visit: Payer: Self-pay | Admitting: Physician Assistant

## 2017-10-25 ENCOUNTER — Other Ambulatory Visit: Payer: Self-pay | Admitting: Physician Assistant

## 2017-10-25 DIAGNOSIS — E039 Hypothyroidism, unspecified: Secondary | ICD-10-CM

## 2017-10-25 DIAGNOSIS — E785 Hyperlipidemia, unspecified: Secondary | ICD-10-CM

## 2017-10-25 DIAGNOSIS — I10 Essential (primary) hypertension: Secondary | ICD-10-CM

## 2017-10-25 DIAGNOSIS — E1165 Type 2 diabetes mellitus with hyperglycemia: Secondary | ICD-10-CM

## 2017-11-01 ENCOUNTER — Other Ambulatory Visit (HOSPITAL_COMMUNITY)
Admission: RE | Admit: 2017-11-01 | Discharge: 2017-11-01 | Disposition: A | Discharge status: Home / Self Care | Payer: Medicaid Other | Source: Ambulatory Visit | Attending: Physician Assistant | Admitting: Physician Assistant

## 2017-11-01 ENCOUNTER — Other Ambulatory Visit (HOSPITAL_COMMUNITY)
Admission: RE | Admit: 2017-11-01 | Discharge: 2017-11-01 | Disposition: A | Discharge status: Home / Self Care | Payer: Self-pay | Source: Ambulatory Visit | Attending: Physician Assistant | Admitting: Physician Assistant

## 2017-11-01 DIAGNOSIS — E785 Hyperlipidemia, unspecified: Secondary | ICD-10-CM | POA: Insufficient documentation

## 2017-11-01 DIAGNOSIS — I10 Essential (primary) hypertension: Secondary | ICD-10-CM | POA: Insufficient documentation

## 2017-11-01 DIAGNOSIS — E039 Hypothyroidism, unspecified: Secondary | ICD-10-CM | POA: Insufficient documentation

## 2017-11-01 DIAGNOSIS — E782 Mixed hyperlipidemia: Secondary | ICD-10-CM | POA: Insufficient documentation

## 2017-11-01 DIAGNOSIS — Z79899 Other long term (current) drug therapy: Secondary | ICD-10-CM | POA: Insufficient documentation

## 2017-11-01 DIAGNOSIS — E1165 Type 2 diabetes mellitus with hyperglycemia: Secondary | ICD-10-CM | POA: Insufficient documentation

## 2017-11-01 LAB — LIPID PANEL
CHOLESTEROL: 130 mg/dL (ref 0–200)
HDL: 43 mg/dL (ref >40)
LDL CALC: 60 mg/dL (ref 0–99)
TRIGLYCERIDES: 135 mg/dL (ref <150)
Total CHOL/HDL Ratio: 3 RATIO
VLDL: 27 mg/dL (ref 0–40)

## 2017-11-01 LAB — COMPREHENSIVE METABOLIC PANEL
ALK PHOS: 91 U/L (ref 38–126)
ALT: 12 U/L — ABNORMAL LOW (ref 14–54)
ANION GAP: 12 (ref 5–15)
AST: 16 U/L (ref 15–41)
Albumin: 3.6 g/dL (ref 3.5–5.0)
BUN: 9 mg/dL (ref 6–20)
CALCIUM: 9 mg/dL (ref 8.9–10.3)
CO2: 23 mmol/L (ref 22–32)
Chloride: 101 mmol/L (ref 101–111)
Creatinine, Ser: 0.82 mg/dL (ref 0.44–1.00)
Glucose, Bld: 83 mg/dL (ref 65–99)
Potassium: 4.4 mmol/L (ref 3.5–5.1)
Sodium: 136 mmol/L (ref 135–145)
TOTAL PROTEIN: 6.9 g/dL (ref 6.5–8.1)
Total Bilirubin: 0.4 mg/dL (ref 0.3–1.2)

## 2017-11-01 LAB — BILIRUBIN, DIRECT: Bilirubin, Direct: 0.1 mg/dL (ref 0.1–0.5)

## 2017-11-01 LAB — TSH: TSH: 3.93 u[IU]/mL (ref 0.350–4.500)

## 2017-11-07 ENCOUNTER — Ambulatory Visit: Payer: Self-pay | Admitting: Physician Assistant

## 2017-11-07 ENCOUNTER — Encounter: Payer: Self-pay | Admitting: Physician Assistant

## 2017-11-07 ENCOUNTER — Ambulatory Visit: Payer: Self-pay | Admitting: Cardiovascular Disease

## 2017-11-07 VITALS — BP 145/78 | HR 78 | Temp 97.3°F | Ht 62.0 in | Wt 140.5 lb

## 2017-11-07 DIAGNOSIS — I2510 Atherosclerotic heart disease of native coronary artery without angina pectoris: Secondary | ICD-10-CM

## 2017-11-07 DIAGNOSIS — I10 Essential (primary) hypertension: Secondary | ICD-10-CM

## 2017-11-07 DIAGNOSIS — R0683 Snoring: Secondary | ICD-10-CM

## 2017-11-07 DIAGNOSIS — F17219 Nicotine dependence, cigarettes, with unspecified nicotine-induced disorders: Secondary | ICD-10-CM

## 2017-11-07 DIAGNOSIS — E039 Hypothyroidism, unspecified: Secondary | ICD-10-CM

## 2017-11-07 DIAGNOSIS — E1165 Type 2 diabetes mellitus with hyperglycemia: Secondary | ICD-10-CM

## 2017-11-07 DIAGNOSIS — E785 Hyperlipidemia, unspecified: Secondary | ICD-10-CM

## 2017-11-07 MED ORDER — METOPROLOL TARTRATE 25 MG PO TABS: 25.0000 mg | ORAL_TABLET | Freq: Two times a day (BID) | ORAL | 1 refills | Status: DC

## 2017-11-07 NOTE — Progress Notes (Signed)
BP (!) 145/78 (BP Location: Right Arm, Patient Position: Sitting, Cuff Size: Normal)   Pulse 78   Temp (!) 97.3 F (36.3 C) (Other (Comment))   Ht 5\' 2"  (1.575 m)   Wt 140 lb 8 oz (63.7 kg)   SpO2 99%   BMI 25.70 kg/m    Subjective:    Patient ID: Lisa Jenkins, female    DOB: 06-13-1951, 67 y.o.   MRN: [id number]  HPI: Lisa Jenkins is a 67 y.o. female presenting on 11/07/2017 for Diabetes; Hyperlipidemia; and Hypothyroidism   HPI  Pt out of her metoprolol.    She didn't bring bs log This am 142.  Last a1c was 10 on 10/09/17.  Using 15u lantus  She complains of snoring a lot and she says her family all thinks she has OSA.  Relevant past medical, surgical, family and social history reviewed and updated as indicated. Interim medical history since our last visit reviewed. Allergies and medications reviewed and updated.   Current Outpatient Medications:  .  amLODipine (NORVASC) 5 MG tablet, Take 1 tablet (5 mg total) daily by mouth., Disp: 90 tablet, Rfl: 1 .  aspirin 81 MG chewable tablet, Chew 81 mg by mouth daily. , Disp: , Rfl:  .  atorvastatin (LIPITOR) 80 MG tablet, Take 1 tablet (80 mg total) daily at 6 PM by mouth., Disp: 90 tablet, Rfl: 1 .  clopidogrel (PLAVIX) 75 MG tablet, Take 1 tablet (75 mg total) daily with breakfast by mouth., Disp: 90 tablet, Rfl: 1 .  insulin glargine (LANTUS) 100 UNIT/ML injection, Inject 15 Units into the skin at bedtime. , Disp: , Rfl:  .  levothyroxine (SYNTHROID, LEVOTHROID) 25 MCG tablet, Take 1 tablet (25 mcg total) daily before breakfast by mouth., Disp: 90 tablet, Rfl: 1 .  lisinopril (PRINIVIL,ZESTRIL) 20 MG tablet, Take 2 tablets (40 mg total) by mouth daily., Disp: 60 tablet, Rfl: 1 .  metFORMIN (GLUCOPHAGE) 1000 MG tablet, Take 1 tablet (1,000 mg total) by mouth 2 (two) times daily with a meal. Restart on 07/12/2017, Disp: 180 tablet, Rfl: 1 .  metoprolol tartrate (LOPRESSOR) 25 MG tablet, Take 1 tablet (25 mg total) 2 (two) times daily  by mouth. (Patient not taking: Reported on 11/07/2017), Disp: 180 tablet, Rfl: 1   Review of Systems  Constitutional: Positive for chills. Negative for appetite change, diaphoresis, fatigue, fever and unexpected weight change.  HENT: Negative for congestion, dental problem, drooling, ear pain, facial swelling, hearing loss, mouth sores, sneezing, sore throat, trouble swallowing and voice change.   Eyes: Negative for pain, discharge, redness, itching and visual disturbance.  Respiratory: Negative for cough, choking, shortness of breath and wheezing.   Cardiovascular: Negative for chest pain, palpitations and leg swelling.  Gastrointestinal: Negative for abdominal pain, blood in stool, constipation, diarrhea and vomiting.  Endocrine: Negative for cold intolerance, heat intolerance and polydipsia.  Genitourinary: Negative for decreased urine volume, dysuria and hematuria.  Musculoskeletal: Positive for gait problem. Negative for arthralgias and back pain.  Skin: Negative for rash.  Allergic/Immunologic: Negative for environmental allergies.  Neurological: Negative for seizures, syncope, light-headedness and headaches.  Hematological: Negative for adenopathy.  Psychiatric/Behavioral: Negative for agitation, dysphoric mood and suicidal ideas. The patient is not nervous/anxious.     Per HPI unless specifically indicated above     Objective:    BP (!) 145/78 (BP Location: Right Arm, Patient Position: Sitting, Cuff Size: Normal)   Pulse 78   Temp (!) 97.3 F (36.3 C) (Other (Comment))  Ht 5\' 2"  (1.575 m)   Wt 140 lb 8 oz (63.7 kg)   SpO2 99%   BMI 25.70 kg/m   Wt Readings from Last 3 Encounters:  11/07/17 140 lb 8 oz (63.7 kg)  10/11/17 149 lb (67.6 kg)  10/09/17 149 lb 4 oz (67.7 kg)    Physical Exam  Constitutional: She is oriented to person, place, and time. She appears well-developed and well-nourished.  HENT:  Head: Normocephalic and atraumatic.  Neck: Neck supple.   Cardiovascular: Normal rate and regular rhythm.  Pulmonary/Chest: Effort normal and breath sounds normal.  Abdominal: Soft. Bowel sounds are normal. She exhibits no mass. There is no hepatosplenomegaly. There is no tenderness.  Musculoskeletal: She exhibits no edema.  Lymphadenopathy:    She has no cervical adenopathy.  Neurological: She is alert and oriented to person, place, and time.  Skin: Skin is warm and dry.  Psychiatric: She has a normal mood and affect. Her behavior is normal.  Vitals reviewed.       Assessment & Plan:    Encounter Diagnoses  Name Primary?  Marland Kitchen Uncontrolled type 2 diabetes mellitus with hyperglycemia ([place]) Yes  . Essential hypertension, benign   . Snoring   . Hyperlipidemia, unspecified hyperlipidemia type   . Hypothyroidism, unspecified type   . Cigarette nicotine dependence with nicotine-induced disorder   . Coronary artery disease involving native coronary artery of native heart without angina pectoris     -reviewed labs with pt -Renewed metoprolol.  Counseled pt to avoid running out of her medication because it makes her bp go high -Increase insulin lantus to 20u qhs.  Pt to monitor bs and notify office for fbs < 70 or > [place] care application for pt to turn in  -will Refer for sleep study -pt given information for upcoming eye appointment -pt to Follow up with bs log 1 month.  RTO sooner prn

## 2017-11-07 NOTE — Patient Instructions (Addendum)
[facility] [address] [place] [facility] [id number] [phone]  Thursday, November 29, 2017 10:40am

## 2017-12-06 ENCOUNTER — Encounter: Payer: Self-pay | Admitting: Physician Assistant

## 2017-12-06 ENCOUNTER — Ambulatory Visit: Payer: Self-pay | Admitting: Physician Assistant

## 2017-12-06 VITALS — BP 134/69 | HR 69 | Temp 99.1°F | Ht 62.0 in | Wt 145.5 lb

## 2017-12-06 DIAGNOSIS — L84 Corns and callosities: Secondary | ICD-10-CM

## 2017-12-06 DIAGNOSIS — E039 Hypothyroidism, unspecified: Secondary | ICD-10-CM

## 2017-12-06 DIAGNOSIS — E1165 Type 2 diabetes mellitus with hyperglycemia: Secondary | ICD-10-CM

## 2017-12-06 DIAGNOSIS — I10 Essential (primary) hypertension: Secondary | ICD-10-CM

## 2017-12-06 DIAGNOSIS — I2510 Atherosclerotic heart disease of native coronary artery without angina pectoris: Secondary | ICD-10-CM

## 2017-12-06 DIAGNOSIS — E785 Hyperlipidemia, unspecified: Secondary | ICD-10-CM

## 2017-12-06 DIAGNOSIS — F17210 Nicotine dependence, cigarettes, uncomplicated: Secondary | ICD-10-CM

## 2017-12-06 NOTE — Progress Notes (Signed)
BP 134/69 (BP Location: Right Arm, Patient Position: Sitting, Cuff Size: Normal)   Pulse 69   Temp 99.1 F (37.3 C)   Ht 5\' 2"  (1.575 m)   Wt 145 lb 8 oz (66 kg)   SpO2 96%   BMI 26.61 kg/m    Subjective:    Patient ID: Lisa Jenkins, female    DOB: May 06, 1951, 67 y.o.   MRN: [id number]  HPI: Lisa Jenkins is a 67 y.o. female presenting on 12/06/2017 for Diabetes and Hypertension   HPI   Pt forgot her bs log again today.   She increased her lantus to 20 units.  Last time she checked her bs was 3 weeks ago.   Pt says she is feeling well.   Relevant past medical, surgical, family and social history reviewed and updated as indicated. Interim medical history since our last visit reviewed. Allergies and medications reviewed and updated.   Current Outpatient Medications:  .  amLODipine (NORVASC) 5 MG tablet, Take 1 tablet (5 mg total) daily by mouth., Disp: 90 tablet, Rfl: 1 .  aspirin 81 MG chewable tablet, Chew 81 mg by mouth daily. , Disp: , Rfl:  .  atorvastatin (LIPITOR) 80 MG tablet, Take 1 tablet (80 mg total) daily at 6 PM by mouth., Disp: 90 tablet, Rfl: 1 .  clopidogrel (PLAVIX) 75 MG tablet, Take 1 tablet (75 mg total) daily with breakfast by mouth., Disp: 90 tablet, Rfl: 1 .  insulin glargine (LANTUS) 100 UNIT/ML injection, Inject 20 Units into the skin at bedtime. , Disp: , Rfl:  .  levothyroxine (SYNTHROID, LEVOTHROID) 25 MCG tablet, Take 1 tablet (25 mcg total) daily before breakfast by mouth., Disp: 90 tablet, Rfl: 1 .  lisinopril (PRINIVIL,ZESTRIL) 20 MG tablet, Take 2 tablets (40 mg total) by mouth daily., Disp: 60 tablet, Rfl: 1 .  metFORMIN (GLUCOPHAGE) 1000 MG tablet, Take 1 tablet (1,000 mg total) by mouth 2 (two) times daily with a meal. Restart on 07/12/2017, Disp: 180 tablet, Rfl: 1 .  metoprolol tartrate (LOPRESSOR) 25 MG tablet, Take 1 tablet (25 mg total) by mouth 2 (two) times daily., Disp: 180 tablet, Rfl: 1   Review of Systems  Constitutional: Positive  for chills. Negative for appetite change, diaphoresis, fatigue, fever and unexpected weight change.  HENT: Negative for congestion, dental problem, drooling, ear pain, facial swelling, hearing loss, mouth sores, sneezing, sore throat, trouble swallowing and voice change.   Eyes: Positive for visual disturbance. Negative for pain, discharge, redness and itching.  Respiratory: Negative for cough, choking, shortness of breath and wheezing.   Cardiovascular: Negative for chest pain, palpitations and leg swelling.  Gastrointestinal: Negative for abdominal pain, blood in stool, constipation, diarrhea and vomiting.  Endocrine: Negative for cold intolerance, heat intolerance and polydipsia.  Genitourinary: Negative for decreased urine volume, dysuria and hematuria.  Musculoskeletal: Negative for arthralgias, back pain and gait problem.  Skin: Negative for rash.  Allergic/Immunologic: Negative for environmental allergies.  Neurological: Negative for seizures, syncope, light-headedness and headaches.  Hematological: Negative for adenopathy.  Psychiatric/Behavioral: Negative for agitation, dysphoric mood and suicidal ideas. The patient is not nervous/anxious.     Per HPI unless specifically indicated above     Objective:    BP 134/69 (BP Location: Right Arm, Patient Position: Sitting, Cuff Size: Normal)   Pulse 69   Temp 99.1 F (37.3 C)   Ht 5\' 2"  (1.575 m)   Wt 145 lb 8 oz (66 kg)   SpO2 96%  BMI 26.61 kg/m   Wt Readings from Last 3 Encounters:  12/06/17 145 lb 8 oz (66 kg)  11/07/17 140 lb 8 oz (63.7 kg)  10/11/17 149 lb (67.6 kg)    Physical Exam  Constitutional: She is oriented to person, place, and time. She appears well-developed and well-nourished.  HENT:  Head: Normocephalic and atraumatic.  Neck: Neck supple.  Cardiovascular: Normal rate and regular rhythm.  Pulmonary/Chest: Effort normal and breath sounds normal.  Abdominal: Soft. Bowel sounds are normal. She exhibits no  mass. There is no hepatosplenomegaly. There is no tenderness.  Musculoskeletal: She exhibits no edema.  Lymphadenopathy:    She has no cervical adenopathy.  Neurological: She is alert and oriented to person, place, and time.  Skin: Skin is warm and dry.  Psychiatric: She has a normal mood and affect. Her behavior is normal.  Vitals reviewed.       Assessment & Plan:   Encounter Diagnoses  Name Primary?  Marland Kitchen Uncontrolled type 2 diabetes mellitus with hyperglycemia ([place]) Yes  . Essential hypertension, benign   . Hyperlipidemia, unspecified hyperlipidemia type   . Coronary artery disease involving native coronary artery of native heart without angina pectoris   . Hypothyroidism, unspecified type   . Cigarette nicotine dependence, uncomplicated   . Corn of foot      -pt to Continue current meds -pt counseled to Monitor fbs.  She is reminded to call office for fbs > 300 or < 70 -Foot exam done- discussed referral to podiatrist in [place] to help with the corns.  Pt says she cant get to [place] for podiatrist.   -discussed with pt that she needs to start thinking about a new PCP for when she gets medicare in October -pt to follow up 2 months.  RTO sooner prn

## 2017-12-18 ENCOUNTER — Other Ambulatory Visit: Payer: Self-pay | Admitting: Physician Assistant

## 2017-12-18 MED ORDER — LISINOPRIL 20 MG PO TABS: 40.0000 mg | ORAL_TABLET | Freq: Every day | ORAL | 1 refills | Status: DC

## 2017-12-20 ENCOUNTER — Ambulatory Visit (INDEPENDENT_AMBULATORY_CARE_PROVIDER_SITE_OTHER): Payer: Self-pay | Admitting: Cardiovascular Disease

## 2017-12-20 ENCOUNTER — Encounter: Payer: Self-pay | Admitting: Cardiovascular Disease

## 2017-12-20 VITALS — BP 154/78 | HR 62 | Ht 63.0 in | Wt 143.0 lb

## 2017-12-20 DIAGNOSIS — Z955 Presence of coronary angioplasty implant and graft: Secondary | ICD-10-CM

## 2017-12-20 DIAGNOSIS — I25118 Atherosclerotic heart disease of native coronary artery with other forms of angina pectoris: Secondary | ICD-10-CM

## 2017-12-20 DIAGNOSIS — E785 Hyperlipidemia, unspecified: Secondary | ICD-10-CM

## 2017-12-20 DIAGNOSIS — Z716 Tobacco abuse counseling: Secondary | ICD-10-CM

## 2017-12-20 DIAGNOSIS — I10 Essential (primary) hypertension: Secondary | ICD-10-CM

## 2017-12-20 DIAGNOSIS — I252 Old myocardial infarction: Secondary | ICD-10-CM

## 2017-12-20 MED ORDER — VARENICLINE TARTRATE 0.5 MG X 11 & 1 MG X 42 PO MISC
ORAL | 0 refills | Status: DC
Start: 2017-12-20 — End: 2018-03-19

## 2017-12-20 NOTE — Progress Notes (Signed)
SUBJECTIVE: The patient presents to establish care with me in our [place] office.  She has a history of non-STEMI with drug-eluting stent placement to the mid RCA in November 2018.  She has 50% disease in the proximal to mid LAD after the first diagonal.  She also has a history of hypertension, hyperlipidemia, insulin-dependent diabetes mellitus, and tobacco use.  I reviewed lipids performed on 11/01/17: Total cholesterol 130, triglycerides 135, HDL 43, LDL 60.  She is here with her niece, [name].  She lives with [name] and her husband.  The patient denies any symptoms of chest pain, palpitations, shortness of breath, lightheadedness, dizziness, leg swelling, orthopnea, PND, and syncope.  She smokes about 1/2 pack of cigarettes daily.  She has not tried quitting.  We talked about cessation at length.  She likes to work outdoors in the yard and has questions about physical activity.  She did not participate in cardiac rehabilitation.   Review of Systems: As per "subjective", otherwise negative.  No Known Allergies  Current Outpatient Medications  Medication Sig Dispense Refill  . amLODipine (NORVASC) 5 MG tablet Take 1 tablet (5 mg total) daily by mouth. 90 tablet 1  . aspirin 81 MG chewable tablet Chew 81 mg by mouth daily.     Marland Kitchen atorvastatin (LIPITOR) 80 MG tablet Take 1 tablet (80 mg total) daily at 6 PM by mouth. 90 tablet 1  . clopidogrel (PLAVIX) 75 MG tablet Take 1 tablet (75 mg total) daily with breakfast by mouth. 90 tablet 1  . insulin glargine (LANTUS) 100 UNIT/ML injection Inject 20 Units into the skin at bedtime.     Marland Kitchen levothyroxine (SYNTHROID, LEVOTHROID) 25 MCG tablet Take 1 tablet (25 mcg total) daily before breakfast by mouth. 90 tablet 1  . lisinopril (PRINIVIL,ZESTRIL) 20 MG tablet Take 2 tablets (40 mg total) by mouth daily. 180 tablet 1  . metFORMIN (GLUCOPHAGE) 1000 MG tablet Take 1 tablet (1,000 mg total) by mouth 2 (two) times daily with a meal. Restart on  07/12/2017 180 tablet 1  . metoprolol tartrate (LOPRESSOR) 25 MG tablet Take 1 tablet (25 mg total) by mouth 2 (two) times daily. 180 tablet 1   No current facility-administered medications for this visit.     Past Medical History:  Diagnosis Date  . Diabetes mellitus without complication ([place])   . High cholesterol   . Hypertension     Past Surgical History:  Procedure Laterality Date  . BUNIONECTOMY Bilateral   . CORONARY STENT INTERVENTION N/A 07/09/2017   Procedure: CORONARY STENT INTERVENTION;  Surgeon: Belva Crome, MD;  Location: [place] [facility];  Service: Cardiovascular;  Laterality: N/A;  . LEFT HEART CATH AND CORONARY ANGIOGRAPHY N/A 07/09/2017   Procedure: LEFT HEART CATH AND CORONARY ANGIOGRAPHY;  Surgeon: Belva Crome, MD;  Location: [place] [facility];  Service: Cardiovascular;  Laterality: N/A;    Social History   Socioeconomic History  . Marital status: Single    Spouse name: Not on file  . Number of children: Not on file  . Years of education: Not on file  . Highest education level: Not on file  Occupational History  . Not on file  Social Needs  . Financial resource strain: Not on file  . Food insecurity:    Worry: Not on file    Inability: Not on file  . Transportation needs:    Medical: Not on file    Non-medical: Not on file  Tobacco Use  .  Smoking status: Current Every Day Smoker    Packs/day: 0.25    Years: 46.00    Pack years: 11.50  . Smokeless tobacco: Never Used  Substance and Sexual Activity  . Alcohol use: No    Alcohol/week: 0.0 oz  . Drug use: No  . Sexual activity: Not on file  Lifestyle  . Physical activity:    Days per week: Not on file    Minutes per session: Not on file  . Stress: Not on file  Relationships  . Social connections:    Talks on phone: Not on file    Gets together: Not on file    Attends religious service: Not on file    Active member of club or organization: Not on file    Attends meetings of clubs or  organizations: Not on file    Relationship status: Not on file  . Intimate partner violence:    Fear of current or ex partner: Not on file    Emotionally abused: Not on file    Physically abused: Not on file    Forced sexual activity: Not on file  Other Topics Concern  . Not on file  Social History Narrative  . Not on file     Vitals:   12/20/17 1134  BP: (!) 154/78  Pulse: 62  SpO2: 99%  Weight: 143 lb (64.9 kg)  Height: 5' 3"  (1.6 m)    Wt Readings from Last 3 Encounters:  12/20/17 143 lb (64.9 kg)  12/06/17 145 lb 8 oz (66 kg)  11/07/17 140 lb 8 oz (63.7 kg)     PHYSICAL EXAM General: NAD HEENT: Normal. Neck: No JVD, no thyromegaly. Lungs: Clear to auscultation bilaterally with normal respiratory effort. CV: Regular rate and rhythm, normal S1/S2, no S3/S4, no murmur. No pretibial or periankle edema.  No carotid bruit.   Abdomen: Soft, nontender, no distention.  Neurologic: Alert and oriented.  Psych: Normal affect. Skin: Normal. Musculoskeletal: No gross deformities.    ECG: Most recent ECG reviewed.   Labs: Lab Results  Component Value Date/Time   K 4.4 11/01/2017 10:28 AM   BUN 9 11/01/2017 10:28 AM   CREATININE 0.82 11/01/2017 10:28 AM   CREATININE 0.82 05/28/2016 10:10 AM   ALT 12 (L) 11/01/2017 10:28 AM   TSH 3.930 11/01/2017 10:28 AM   TSH 4.38 05/28/2016 10:10 AM   HGB 9.8 (L) 10/11/2017 07:08 PM   HGB 11.5 07/19/2017 11:28 AM     Lipids: Lab Results  Component Value Date/Time   LDLCALC 60 11/01/2017 10:28 AM   CHOL 130 11/01/2017 10:28 AM   TRIG 135 11/01/2017 10:28 AM   HDL 43 11/01/2017 10:28 AM       ASSESSMENT AND PLAN: 1.  Coronary disease with history of non-STEMI and drug-eluting stent placement to the RCA: Symptomatically stable.  Continue aspirin, Plavix, metoprolol, and Lipitor.  I told her about the benefits of cardiac rehabilitation.  I will enroll her.  2.  Hypertension: Blood pressure is elevated.  She is due to have  her lisinopril refilled which she did not take today.  I will monitor this.  3.  Hyperlipidemia: Lipids reviewed above and at goal.  Continue Lipitor 80 mg.  4. Tobacco abuse: She smokes about 1/2 pack of cigarettes daily.  She has not tried quitting.  We talked about cessation at length.  I will prescribe a Chantix starter kit.    Disposition: Follow up 3 months   Kate Sable, M.D., F.A.C.C.

## 2017-12-20 NOTE — Patient Instructions (Addendum)
Your physician wants you to follow-up in:3 months  with Dr.Koneswaran    Take Chantix as directed    All other medications stay the same   You have been referred to Cardiac rehab      No lab work or tests ordered today     Thank you for choosing [facility] !

## 2018-01-03 ENCOUNTER — Other Ambulatory Visit: Payer: Self-pay | Admitting: Physician Assistant

## 2018-01-26 ENCOUNTER — Emergency Department (HOSPITAL_COMMUNITY)
Admission: EM | Admit: 2018-01-26 | Discharge: 2018-01-26 | Disposition: A | Discharge status: Home / Self Care | Payer: Medicaid Other | Attending: Emergency Medicine | Admitting: Emergency Medicine

## 2018-01-26 ENCOUNTER — Other Ambulatory Visit: Payer: Self-pay

## 2018-01-26 ENCOUNTER — Emergency Department (HOSPITAL_COMMUNITY): Payer: Medicaid Other

## 2018-01-26 ENCOUNTER — Encounter (HOSPITAL_COMMUNITY): Payer: Self-pay | Admitting: Emergency Medicine

## 2018-01-26 DIAGNOSIS — R2232 Localized swelling, mass and lump, left upper limb: Secondary | ICD-10-CM | POA: Insufficient documentation

## 2018-01-26 DIAGNOSIS — Z79899 Other long term (current) drug therapy: Secondary | ICD-10-CM | POA: Insufficient documentation

## 2018-01-26 DIAGNOSIS — M7989 Other specified soft tissue disorders: Secondary | ICD-10-CM

## 2018-01-26 DIAGNOSIS — Z7982 Long term (current) use of aspirin: Secondary | ICD-10-CM | POA: Insufficient documentation

## 2018-01-26 DIAGNOSIS — Z794 Long term (current) use of insulin: Secondary | ICD-10-CM | POA: Insufficient documentation

## 2018-01-26 DIAGNOSIS — I2510 Atherosclerotic heart disease of native coronary artery without angina pectoris: Secondary | ICD-10-CM | POA: Insufficient documentation

## 2018-01-26 DIAGNOSIS — I252 Old myocardial infarction: Secondary | ICD-10-CM | POA: Insufficient documentation

## 2018-01-26 DIAGNOSIS — I10 Essential (primary) hypertension: Secondary | ICD-10-CM | POA: Insufficient documentation

## 2018-01-26 DIAGNOSIS — F17200 Nicotine dependence, unspecified, uncomplicated: Secondary | ICD-10-CM | POA: Insufficient documentation

## 2018-01-26 DIAGNOSIS — E7800 Pure hypercholesterolemia, unspecified: Secondary | ICD-10-CM | POA: Diagnosis not present

## 2018-01-26 DIAGNOSIS — E119 Type 2 diabetes mellitus without complications: Secondary | ICD-10-CM | POA: Diagnosis not present

## 2018-01-26 DIAGNOSIS — Z7902 Long term (current) use of antithrombotics/antiplatelets: Secondary | ICD-10-CM | POA: Insufficient documentation

## 2018-01-26 HISTORY — DX: Atherosclerotic heart disease of native coronary artery without angina pectoris: I25.10

## 2018-01-26 HISTORY — DX: Acute myocardial infarction, unspecified: I21.9

## 2018-01-26 MED ORDER — AMOXICILLIN-POT CLAVULANATE 875-125 MG PO TABS: 1.0000 | ORAL_TABLET | Freq: Two times a day (BID) | ORAL | 0 refills | Status: AC

## 2018-01-26 MED ORDER — MELOXICAM 7.5 MG PO TABS: 15.0000 mg | ORAL_TABLET | Freq: Every day | ORAL | 0 refills | Status: DC

## 2018-01-26 MED ORDER — AMOXICILLIN-POT CLAVULANATE 875-125 MG PO TABS
1.0000 | ORAL_TABLET | Freq: Once | ORAL | Status: AC
Start: 2018-01-26 — End: 2018-01-26
  Administered 2018-01-26: 1 via ORAL
  Filled 2018-01-26: qty 1

## 2018-01-26 MED ORDER — IBUPROFEN 800 MG PO TABS
800.0000 mg | ORAL_TABLET | Freq: Once | ORAL | Status: AC
  Administered 2018-01-26: 800 mg via ORAL
  Filled 2018-01-26: qty 1

## 2018-01-26 NOTE — ED Notes (Signed)
Swelling noted to left hand. Patient states she was treated here recently for same but to right hand and was told it was arthritis.

## 2018-01-26 NOTE — Discharge Instructions (Signed)

## 2018-01-26 NOTE — ED Provider Notes (Signed)
[facility] EMERGENCY DEPARTMENT Provider Note   CSN: [id number] Arrival date & time: 01/26/18  1257     History   Chief Complaint Chief Complaint  Patient presents with  . Hand Problem    HPI Lisa Jenkins is a 67 y.o. female.  HPI  The pt is a 67 y/o female -  Hx of dog scratch to the LUE -states that she had a scratch 3 days ago, she reports that she has been hurting for 3 days but on further questioning it seems like today the pain is when it really started.  She was actually fishing this morning, when she started having trouble bathing her hook with the warm she decided to come get checked out.  She denies fever but has had mild swelling on the back of the left hand.  Symptoms are persistent, not associated with fevers.  No medication given prehospital.  Past Medical History:  Diagnosis Date  . Coronary artery disease   . Diabetes mellitus without complication ([place])   . High cholesterol   . Hypertension   . Myocardial infarction [facility])     Patient Active Problem List   Diagnosis Date Noted  . NSTEMI (non-ST elevated myocardial infarction) ([place]) 07/08/2017  . Community acquired pneumonia of right upper lobe of lung ([place])   . Chest pain 07/28/2016  . 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 Hypothyroidism 10/21/2015  . Uncontrolled type 2 diabetes mellitus with complication ([place]) 23/76/2831  . Type 2 diabetes mellitus with complication ([place]) 51/76/1607  . Hyperlipidemia 07/21/2015  . Essential hypertension, benign 07/21/2015  . Cigarette nicotine dependence, uncomplicated 37/06/6268  . Type 2 diabetes mellitus with retinopathy ([place]) 07/21/2015  . Proteinuria 07/21/2015  . Thyroid activity decreased 07/21/2015    Past Surgical History:  Procedure Laterality Date  . BUNIONECTOMY Bilateral   . CORONARY STENT INTERVENTION N/A 07/09/2017   Procedure: CORONARY STENT INTERVENTION;  Surgeon: Belva Crome, MD;  Location: [place] [facility];  Service: Cardiovascular;   Laterality: N/A;  . LEFT HEART CATH AND CORONARY ANGIOGRAPHY N/A 07/09/2017   Procedure: LEFT HEART CATH AND CORONARY ANGIOGRAPHY;  Surgeon: Belva Crome, MD;  Location: [place] [facility];  Service: Cardiovascular;  Laterality: N/A;     OB History    Gravida      Para      Term      Preterm      AB      Living  0     SAB      TAB      Ectopic      Multiple      Live Births               Home Medications    Prior to Admission medications   Medication Sig Start Date End Date Taking? Authorizing Provider  amLODipine (NORVASC) 5 MG tablet TAKE 1 Tablet BY MOUTH ONCE DAILY 01/04/18   Soyla Dryer, PA-C  amoxicillin-clavulanate (AUGMENTIN) 875-125 MG tablet Take 1 tablet by mouth 2 (two) times daily for 10 days. 01/26/18 02/05/18  Noemi Chapel, MD  aspirin 81 MG chewable tablet Chew 81 mg by mouth daily.     [provider]  atorvastatin (LIPITOR) 80 MG tablet TAKE 1 Tablet BY MOUTH ONCE DAILY AT 6:00PM 01/04/18   Soyla Dryer, PA-C  clopidogrel (PLAVIX) 75 MG tablet TAKE 1 Tablet BY MOUTH ONCE DAILY WITH BREAKFAST 01/04/18   Soyla Dryer, PA-C  insulin glargine (LANTUS) 100 UNIT/ML injection Inject  20 Units into the skin at bedtime.     [provider]  lisinopril (PRINIVIL,ZESTRIL) 20 MG tablet Take 2 tablets (40 mg total) by mouth daily. 12/18/17   Soyla Dryer, PA-C  meloxicam (MOBIC) 7.5 MG tablet Take 2 tablets (15 mg total) by mouth daily. 01/26/18   Noemi Chapel, MD  metFORMIN (GLUCOPHAGE) 1000 MG tablet TAKE 1 Tablet  BY MOUTH TWICE DAILY WITH MEALS 01/04/18   Soyla Dryer, PA-C  metoprolol tartrate (LOPRESSOR) 25 MG tablet Take 1 tablet (25 mg total) by mouth 2 (two) times daily. 11/07/17   Soyla Dryer, PA-C  SYNTHROID 25 MCG tablet TAKE 1 Tablet BY MOUTH ONCE DAILY BEFORE BREAKFAST 01/04/18   Soyla Dryer, PA-C  varenicline (CHANTIX STARTING MONTH PAK) 0.5 MG X 11 & 1 MG X 42 tablet Take one 0.5 mg tablet by mouth once daily for  3 days, then increase to one 0.5 mg tablet twice daily for 4 days, then increase to one 1 mg tablet twice daily. 12/20/17   Herminio Commons, MD    Family History Family History  Problem Relation Age of Onset  . Diabetes Mother   . Heart disease Mother   . Heart disease Father   . Diabetes Sister   . Heart disease Sister   . Diabetes Brother   . Heart disease Brother   . Diabetes Brother   . Heart disease Brother   . Diabetes Brother   . Heart disease Brother   . Diabetes Brother   . Heart disease Brother   . Diabetes Brother   . Heart disease Brother     Social History Social History   Tobacco Use  . Smoking status: Current Every Day Smoker    Packs/day: 0.25    Years: 46.00    Pack years: 11.50  . Smokeless tobacco: Never Used  Substance Use Topics  . Alcohol use: No    Alcohol/week: 0.0 oz  . Drug use: No     Allergies   Patient has no known allergies.   Review of Systems Review of Systems  Constitutional: Negative for fever.  Musculoskeletal: Positive for joint swelling.  Neurological: Negative for weakness and numbness.     Physical Exam Updated Vital Signs BP (!) 145/75 (BP Location: Right Arm)   Pulse 66   Temp 99.1 F (37.3 C) (Oral)   Resp 16   Ht 5\' 2"  (1.575 m)   Wt 64.9 kg (143 lb)   SpO2 98%   BMI 26.16 kg/m   Physical Exam  Constitutional: She appears well-developed and well-nourished.  HENT:  Head: Normocephalic and atraumatic.  Eyes: Conjunctivae are normal. Right eye exhibits no discharge. Left eye exhibits no discharge.  Pulmonary/Chest: Effort normal. No respiratory distress.  Musculoskeletal:  Mild tenderness to the dorsum of the left hand, there is no redness, there is mild objective swelling, she has the ability to flex all 5 fingers without any difficulty.  She does not want to move her hand because there is some pain.  I am able to passively range her wrist and her fingers with minimal pain however when she does it  actively she has more.  There is a wound to the distal dorsal left upper extremity proximal to the wrist which is subcentimeter, non-erythematous, there is a small skin tear, small amount of blood at the site, no purulence, no induration, minimal tenderness around this area.  There is no lymphadenopathy in the antecubital fossa or the axilla on the left  Neurological: She is alert. Coordination normal.  Skin: Skin is warm and dry. No rash noted. She is not diaphoretic. No erythema.  Psychiatric: She has a normal mood and affect.  Nursing note and vitals reviewed.    ED Treatments / Results  Labs (all labs ordered are listed, but only abnormal results are displayed) Labs Reviewed - No data to display  EKG None  Radiology Dg Hand Complete Left  Result Date: 01/26/2018 CLINICAL DATA:  Scratch dog 3 days ago.  Redness and swelling. EXAM: LEFT HAND - COMPLETE 3+ VIEW COMPARISON:  None. FINDINGS: soft tissue swelling is most apparent about the dorsal carpus and proximal metacarpals on the lateral view. No soft tissue gas. No radiopaque foreign object. No focal osseous lesion. No osseous destruction. IMPRESSION: Soft tissue swelling, without acute osseous abnormality. Electronically Signed   By: Abigail Miyamoto M.D.   On: 01/26/2018 13:57    Procedures Procedures (including critical care time)  Medications Ordered in ED Medications  ibuprofen (ADVIL,MOTRIN) tablet 800 mg (800 mg Oral Given 01/26/18 1408)  amoxicillin-clavulanate (AUGMENTIN) 875-125 MG per tablet 1 tablet (1 tablet Oral Given 01/26/18 1408)     Initial Impression / Assessment and Plan / ED Course  I have reviewed the triage vital signs and the nursing notes.  Pertinent labs & imaging results that were available during my care of the patient were reviewed by me and considered in my medical decision making (see chart for details).    The patient has evidence of a dog injury, this was not a bite it was a scratch, she has mild  objective swelling but no redness, no fevers, tenderness to the touch.  Will image to rule out other possible causes of pain including foreign body, she will likely need an antibiotic, anti-inflammatories, no indication for aggressive management, specialty consultation or admission at this time.  X-rays show no signs of foreign body or bony injury, or free air  The patient was informed that her x-ray showed some soft tissue swelling.  This may related to bacterial infection, she was given Augmentin, she appears well for discharge, she expressed her understanding to all of the instructions including returning if she should develop increasing pain swelling redness or fevers.  Final Clinical Impressions(s) / ED Diagnoses   Final diagnoses:  Swelling of left hand    ED Discharge Orders        Ordered    amoxicillin-clavulanate (AUGMENTIN) 875-125 MG tablet  2 times daily     01/26/18 1438    meloxicam (MOBIC) 7.5 MG tablet  Daily     01/26/18 1438       Noemi Chapel, MD 01/26/18 1439

## 2018-01-26 NOTE — ED Triage Notes (Signed)
Pt reports LT hand and wrist swelling that began early this morning. Area is warm to touch. Denies recent injury except for scratch from a dog. Denies fever.

## 2018-01-31 ENCOUNTER — Other Ambulatory Visit (HOSPITAL_COMMUNITY)
Admission: RE | Admit: 2018-01-31 | Discharge: 2018-01-31 | Disposition: A | Discharge status: Home / Self Care | Payer: Medicaid Other | Source: Ambulatory Visit | Attending: Physician Assistant | Admitting: Physician Assistant

## 2018-01-31 DIAGNOSIS — I2510 Atherosclerotic heart disease of native coronary artery without angina pectoris: Secondary | ICD-10-CM | POA: Diagnosis not present

## 2018-01-31 DIAGNOSIS — R69 Illness, unspecified: Secondary | ICD-10-CM | POA: Diagnosis present

## 2018-01-31 DIAGNOSIS — E039 Hypothyroidism, unspecified: Secondary | ICD-10-CM

## 2018-01-31 DIAGNOSIS — E1165 Type 2 diabetes mellitus with hyperglycemia: Secondary | ICD-10-CM

## 2018-01-31 DIAGNOSIS — E785 Hyperlipidemia, unspecified: Secondary | ICD-10-CM | POA: Diagnosis present

## 2018-01-31 DIAGNOSIS — I10 Essential (primary) hypertension: Secondary | ICD-10-CM | POA: Diagnosis present

## 2018-01-31 LAB — COMPREHENSIVE METABOLIC PANEL
ALT: 10 U/L — ABNORMAL LOW (ref 14–54)
AST: 10 U/L — AB (ref 15–41)
Albumin: 3 g/dL — ABNORMAL LOW (ref 3.5–5.0)
Alkaline Phosphatase: 90 U/L (ref 38–126)
Anion gap: 11 (ref 5–15)
BUN: 17 mg/dL (ref 6–20)
CHLORIDE: 103 mmol/L (ref 101–111)
CO2: 25 mmol/L (ref 22–32)
Calcium: 9.2 mg/dL (ref 8.9–10.3)
Creatinine, Ser: 0.83 mg/dL (ref 0.44–1.00)
GFR calc Af Amer: >60 mL/min (ref >60)
GFR calc non Af Amer: >60 mL/min (ref >60)
Glucose, Bld: 146 mg/dL — ABNORMAL HIGH (ref 65–99)
POTASSIUM: 4.2 mmol/L (ref 3.5–5.1)
SODIUM: 139 mmol/L (ref 135–145)
Total Bilirubin: 0.5 mg/dL (ref 0.3–1.2)
Total Protein: 7.1 g/dL (ref 6.5–8.1)

## 2018-01-31 LAB — TSH: TSH: 3.13 u[IU]/mL (ref 0.350–4.500)

## 2018-01-31 LAB — LIPID PANEL
Cholesterol: 126 mg/dL (ref 0–200)
HDL: 36 mg/dL — ABNORMAL LOW (ref >40)
LDL CALC: 67 mg/dL (ref 0–99)
Total CHOL/HDL Ratio: 3.5 RATIO
Triglycerides: 116 mg/dL (ref <150)
VLDL: 23 mg/dL (ref 0–40)

## 2018-01-31 LAB — HEMOGLOBIN A1C
Hgb A1c MFr Bld: 7.9 % — ABNORMAL HIGH (ref 4.8–5.6)
Mean Plasma Glucose: 180.03 mg/dL

## 2018-02-01 LAB — MICROALBUMIN, URINE: Microalb, Ur: 3257.4 ug/mL — ABNORMAL HIGH

## 2018-02-05 ENCOUNTER — Encounter: Payer: Self-pay | Admitting: Physician Assistant

## 2018-02-05 ENCOUNTER — Ambulatory Visit: Payer: Self-pay | Admitting: Physician Assistant

## 2018-02-05 VITALS — BP 154/68 | HR 68 | Temp 97.9°F | Ht 63.0 in | Wt 139.0 lb

## 2018-02-05 DIAGNOSIS — I2510 Atherosclerotic heart disease of native coronary artery without angina pectoris: Secondary | ICD-10-CM

## 2018-02-05 DIAGNOSIS — I10 Essential (primary) hypertension: Secondary | ICD-10-CM

## 2018-02-05 DIAGNOSIS — F17210 Nicotine dependence, cigarettes, uncomplicated: Secondary | ICD-10-CM

## 2018-02-05 DIAGNOSIS — E785 Hyperlipidemia, unspecified: Secondary | ICD-10-CM

## 2018-02-05 DIAGNOSIS — E039 Hypothyroidism, unspecified: Secondary | ICD-10-CM

## 2018-02-05 DIAGNOSIS — E1165 Type 2 diabetes mellitus with hyperglycemia: Secondary | ICD-10-CM

## 2018-02-05 MED ORDER — AMLODIPINE BESYLATE 10 MG PO TABS: 10.0000 mg | ORAL_TABLET | Freq: Every day | ORAL | 1 refills | Status: DC

## 2018-02-05 MED ORDER — ATORVASTATIN CALCIUM 80 MG PO TABS: ORAL_TABLET | ORAL | 1 refills | Status: AC

## 2018-02-05 NOTE — Progress Notes (Signed)
BP (!) 154/68 (BP Location: Left Arm, Patient Position: Sitting, Cuff Size: Normal)   Pulse 68   Temp 97.9 F (36.6 C) (Other (Comment))   Ht 5\' 3"  (1.6 m)   Wt 139 lb (63 kg)   SpO2 99%   BMI 24.62 kg/m    Subjective:    Patient ID: Lisa Jenkins, female    DOB: [birth year], 67 y.o.   MRN: [id number]  HPI: Lisa Jenkins is a 67 y.o. female presenting on 02/05/2018 for Diabetes; Hypertension; and Hyperlipidemia   HPI   Pt is feeling well today.  She has no complaints today.  She has bs log- range - 165-72  Pt is still smoking "but just a little bit"  Relevant past medical, surgical, family and social history reviewed and updated as indicated. Interim medical history since our last visit reviewed. Allergies and medications reviewed and updated.   Current Outpatient Medications:  .  amLODipine (NORVASC) 5 MG tablet, TAKE 1 Tablet BY MOUTH ONCE DAILY, Disp: 90 tablet, Rfl: 1 .  amoxicillin-clavulanate (AUGMENTIN) 875-125 MG tablet, Take 1 tablet by mouth 2 (two) times daily for 10 days., Disp: 20 tablet, Rfl: 0 .  aspirin 81 MG chewable tablet, Chew 81 mg by mouth daily. , Disp: , Rfl:  .  atorvastatin (LIPITOR) 80 MG tablet, TAKE 1 Tablet BY MOUTH ONCE DAILY AT 6:00PM, Disp: 90 tablet, Rfl: 1 .  clopidogrel (PLAVIX) 75 MG tablet, TAKE 1 Tablet BY MOUTH ONCE DAILY WITH BREAKFAST, Disp: 90 tablet, Rfl: 1 .  insulin glargine (LANTUS) 100 UNIT/ML injection, Inject 20 Units into the skin at bedtime. , Disp: , Rfl:  .  lisinopril (PRINIVIL,ZESTRIL) 20 MG tablet, Take 2 tablets (40 mg total) by mouth daily., Disp: 180 tablet, Rfl: 1 .  meloxicam (MOBIC) 7.5 MG tablet, Take 2 tablets (15 mg total) by mouth daily., Disp: 30 tablet, Rfl: 0 .  metFORMIN (GLUCOPHAGE) 1000 MG tablet, TAKE 1 Tablet  BY MOUTH TWICE DAILY WITH MEALS, Disp: 180 tablet, Rfl: 1 .  metoprolol tartrate (LOPRESSOR) 25 MG tablet, Take 1 tablet (25 mg total) by mouth 2 (two) times daily., Disp: 180 tablet, Rfl: 1 .   SYNTHROID 25 MCG tablet, TAKE 1 Tablet BY MOUTH ONCE DAILY BEFORE BREAKFAST, Disp: 90 tablet, Rfl: 1 .  varenicline (CHANTIX STARTING MONTH PAK) 0.5 MG X 11 & 1 MG X 42 tablet, Take one 0.5 mg tablet by mouth once daily for 3 days, then increase to one 0.5 mg tablet twice daily for 4 days, then increase to one 1 mg tablet twice daily. (Patient not taking: Reported on 02/05/2018), Disp: 53 tablet, Rfl: 0   Review of Systems  Constitutional: Negative for appetite change, chills, diaphoresis, fatigue, fever and unexpected weight change.  HENT: Negative for congestion, dental problem, drooling, ear pain, facial swelling, hearing loss, mouth sores, sneezing, sore throat, trouble swallowing and voice change.   Eyes: Negative for pain, discharge, redness, itching and visual disturbance.  Respiratory: Negative for cough, choking, shortness of breath and wheezing.   Cardiovascular: Negative for chest pain, palpitations and leg swelling.  Gastrointestinal: Negative for abdominal pain, blood in stool, constipation, diarrhea and vomiting.  Endocrine: Negative for cold intolerance, heat intolerance and polydipsia.  Genitourinary: Negative for decreased urine volume, dysuria and hematuria.  Musculoskeletal: Negative for arthralgias, back pain and gait problem.  Skin: Negative for rash.  Allergic/Immunologic: Negative for environmental allergies.  Neurological: Negative for seizures, syncope, light-headedness and headaches.  Hematological: Negative for adenopathy.  Psychiatric/Behavioral: Negative for agitation, dysphoric mood and suicidal ideas. The patient is not nervous/anxious.     Per HPI unless specifically indicated above     Objective:    BP (!) 154/68 (BP Location: Left Arm, Patient Position: Sitting, Cuff Size: Normal)   Pulse 68   Temp 97.9 F (36.6 C) (Other (Comment))   Ht 5\' 3"  (1.6 m)   Wt 139 lb (63 kg)   SpO2 99%   BMI 24.62 kg/m   Wt Readings from Last 3 Encounters:  02/05/18 139  lb (63 kg)  01/26/18 143 lb (64.9 kg)  12/20/17 143 lb (64.9 kg)    Physical Exam  Constitutional: She is oriented to person, place, and time. She appears well-developed and well-nourished.  HENT:  Head: Normocephalic and atraumatic.  Neck: Neck supple.  Cardiovascular: Normal rate and regular rhythm.  Pulmonary/Chest: Effort normal and breath sounds normal.  Abdominal: Soft. Bowel sounds are normal. She exhibits no mass. There is no hepatosplenomegaly. There is no tenderness.  Musculoskeletal: She exhibits no edema.  Lymphadenopathy:    She has no cervical adenopathy.  Neurological: She is alert and oriented to person, place, and time.  Skin: Skin is warm and dry.  Psychiatric: She has a normal mood and affect. Her behavior is normal.  Vitals reviewed.   Results for orders placed or performed during the hospital encounter of 01/31/18  TSH  Result Value Ref Range   TSH 3.130 0.350 - 4.500 uIU/mL  Lipid panel  Result Value Ref Range   Cholesterol 126 0 - 200 mg/dL   Triglycerides 116 <150 mg/dL   HDL 36 (L) >40 mg/dL   Total CHOL/HDL Ratio 3.5 RATIO   VLDL 23 0 - 40 mg/dL   LDL Cholesterol 67 0 - 99 mg/dL  Comprehensive metabolic panel  Result Value Ref Range   Sodium 139 135 - 145 mmol/L   Potassium 4.2 3.5 - 5.1 mmol/L   Chloride 103 101 - 111 mmol/L   CO2 25 22 - 32 mmol/L   Glucose, Bld 146 (H) 65 - 99 mg/dL   BUN 17 6 - 20 mg/dL   Creatinine, Ser 0.83 0.44 - 1.00 mg/dL   Calcium 9.2 8.9 - 10.3 mg/dL   Total Protein 7.1 6.5 - 8.1 g/dL   Albumin 3.0 (L) 3.5 - 5.0 g/dL   AST 10 (L) 15 - 41 U/L   ALT 10 (L) 14 - 54 U/L   Alkaline Phosphatase 90 38 - 126 U/L   Total Bilirubin 0.5 0.3 - 1.2 mg/dL   GFR calc non Af Amer >60 >60 mL/min   GFR calc Af Amer >60 >60 mL/min   Anion gap 11 5 - 15  Hemoglobin A1c  Result Value Ref Range   Hgb A1c MFr Bld 7.9 (H) 4.8 - 5.6 %   Mean Plasma Glucose 180.03 mg/dL  Microalbumin, urine  Result Value Ref Range   Microalb, Ur  3,257.4 (H) Not Estab. ug/mL      Assessment & Plan:   Encounter Diagnoses  Name Primary?  Marland Kitchen Uncontrolled type 2 diabetes mellitus with hyperglycemia ([place]) Yes  . Essential hypertension, benign   . Hyperlipidemia, unspecified hyperlipidemia type   . Coronary artery disease involving native coronary artery of native heart without angina pectoris   . Hypothyroidism, unspecified type   . Cigarette nicotine dependence, uncomplicated      -reviewed labs with pt -increase amlodipine for the blood pressure that is too high -pt to continue other medications -counseled  smoking cessation -discussed with pt that she needs to start thinking about where she wants to establish for PCP when she gets medicare in october -follow up 6 weeks to recheck blood pressure.  RTO sooner prn

## 2018-02-05 NOTE — Patient Instructions (Signed)
Diabetes Mellitus and Nutrition When you have diabetes (diabetes mellitus), it is very important to have healthy eating habits because your blood sugar (glucose) levels are greatly affected by what you eat and drink. Eating healthy foods in the appropriate amounts, at about the same times every day, can help you:  Control your blood glucose.  Lower your risk of heart disease.  Improve your blood pressure.  Reach or maintain a healthy weight.  Every person with diabetes is different, and each person has different needs for a meal plan. Your health care provider may recommend that you work with a diet and nutrition specialist (dietitian) to make a meal plan that is best for you. Your meal plan may vary depending on factors such as:  The calories you need.  The medicines you take.  Your weight.  Your blood glucose, blood pressure, and cholesterol levels.  Your activity level.  Other health conditions you have, such as heart or kidney disease.  How do carbohydrates affect me? Carbohydrates affect your blood glucose level more than any other type of food. Eating carbohydrates naturally increases the amount of glucose in your blood. Carbohydrate counting is a method for keeping track of how many carbohydrates you eat. Counting carbohydrates is important to keep your blood glucose at a healthy level, especially if you use insulin or take certain oral diabetes medicines. It is important to know how many carbohydrates you can safely have in each meal. This is different for every person. Your dietitian can help you calculate how many carbohydrates you should have at each meal and for snack. Foods that contain carbohydrates include:  Bread, cereal, rice, pasta, and crackers.  Potatoes and corn.  Peas, beans, and lentils.  Milk and yogurt.  Fruit and juice.  Desserts, such as cakes, cookies, ice cream, and candy.  How does alcohol affect me? Alcohol can cause a sudden decrease in blood  glucose (hypoglycemia), especially if you use insulin or take certain oral diabetes medicines. Hypoglycemia can be a life-threatening condition. Symptoms of hypoglycemia (sleepiness, dizziness, and confusion) are similar to symptoms of having too much alcohol. If your health care provider says that alcohol is safe for you, follow these guidelines:  Limit alcohol intake to no more than 1 drink per day for nonpregnant women and 2 drinks per day for men. One drink equals 12 oz of beer, 5 oz of wine, or 1 oz of hard liquor.  Do not drink on an empty stomach.  Keep yourself hydrated with water, diet soda, or unsweetened iced tea.  Keep in mind that regular soda, juice, and other mixers may contain a lot of sugar and must be counted as carbohydrates.  What are tips for following this plan? Reading food labels  Start by checking the serving size on the label. The amount of calories, carbohydrates, fats, and other nutrients listed on the label are based on one serving of the food. Many foods contain more than one serving per package.  Check the total grams (g) of carbohydrates in one serving. You can calculate the number of servings of carbohydrates in one serving by dividing the total carbohydrates by 15. For example, if a food has 30 g of total carbohydrates, it would be equal to 2 servings of carbohydrates.  Check the number of grams (g) of saturated and trans fats in one serving. Choose foods that have low or no amount of these fats.  Check the number of milligrams (mg) of sodium in one serving. Most people   should limit total sodium intake to less than 2,300 mg per day.  Always check the nutrition information of foods labeled as "low-fat" or "nonfat". These foods may be higher in added sugar or refined carbohydrates and should be avoided.  Talk to your dietitian to identify your daily goals for nutrients listed on the label. Shopping  Avoid buying canned, premade, or processed foods. These  foods tend to be high in fat, sodium, and added sugar.  Shop around the outside edge of the grocery store. This includes fresh fruits and vegetables, bulk grains, fresh meats, and fresh dairy. Cooking  Use low-heat cooking methods, such as baking, instead of high-heat cooking methods like deep frying.  Cook using healthy oils, such as olive, canola, or sunflower oil.  Avoid cooking with butter, cream, or high-fat meats. Meal planning  Eat meals and snacks regularly, preferably at the same times every day. Avoid going long periods of time without eating.  Eat foods high in fiber, such as fresh fruits, vegetables, beans, and whole grains. Talk to your dietitian about how many servings of carbohydrates you can eat at each meal.  Eat 4-6 ounces of lean protein each day, such as lean meat, chicken, fish, eggs, or tofu. 1 ounce is equal to 1 ounce of meat, chicken, or fish, 1 egg, or 1/4 cup of tofu.  Eat some foods each day that contain healthy fats, such as avocado, nuts, seeds, and fish. Lifestyle   Check your blood glucose regularly.  Exercise at least 30 minutes 5 or more days each week, or as told by your health care provider.  Take medicines as told by your health care provider.  Do not use any products that contain nicotine or tobacco, such as cigarettes and e-cigarettes. If you need help quitting, ask your health care provider.  Work with a counselor or diabetes educator to identify strategies to manage stress and any emotional and social challenges. What are some questions to ask my health care provider?  Do I need to meet with a diabetes educator?  Do I need to meet with a dietitian?  What number can I call if I have questions?  When are the best times to check my blood glucose? Where to find more information:  American Diabetes Association: diabetes.org/food-and-fitness/food  Academy of Nutrition and Dietetics:  www.eatright.org/resources/health/diseases-and-conditions/diabetes  National Institute of Diabetes and Digestive and Kidney Diseases (NIH): www.niddk.nih.gov/health-information/diabetes/overview/diet-eating-physical-activity Summary  A healthy meal plan will help you control your blood glucose and maintain a healthy lifestyle.  Working with a diet and nutrition specialist (dietitian) can help you make a meal plan that is best for you.  Keep in mind that carbohydrates and alcohol have immediate effects on your blood glucose levels. It is important to count carbohydrates and to use alcohol carefully. This information is not intended to replace advice given to you by your health care provider. Make sure you discuss any questions you have with your health care provider. Document Released: 05/18/2005 Document Revised: 09/25/2016 Document Reviewed: 09/25/2016 Elsevier Interactive Patient Education  2018 Else[employer] Inc.  

## 2018-02-19 ENCOUNTER — Encounter: Payer: Self-pay | Admitting: Cardiovascular Disease

## 2018-03-19 ENCOUNTER — Ambulatory Visit: Payer: Medicaid Other | Admitting: Physician Assistant

## 2018-03-19 ENCOUNTER — Encounter: Payer: Self-pay | Admitting: Physician Assistant

## 2018-03-19 VITALS — BP 114/60 | HR 55 | Temp 98.1°F | Wt 135.0 lb

## 2018-03-19 DIAGNOSIS — F17219 Nicotine dependence, cigarettes, with unspecified nicotine-induced disorders: Secondary | ICD-10-CM

## 2018-03-19 DIAGNOSIS — E1165 Type 2 diabetes mellitus with hyperglycemia: Secondary | ICD-10-CM

## 2018-03-19 DIAGNOSIS — E785 Hyperlipidemia, unspecified: Secondary | ICD-10-CM

## 2018-03-19 DIAGNOSIS — E039 Hypothyroidism, unspecified: Secondary | ICD-10-CM

## 2018-03-19 DIAGNOSIS — I10 Essential (primary) hypertension: Secondary | ICD-10-CM

## 2018-03-19 DIAGNOSIS — I2510 Atherosclerotic heart disease of native coronary artery without angina pectoris: Secondary | ICD-10-CM

## 2018-03-19 NOTE — Progress Notes (Signed)
BP 114/60 (BP Location: Left Arm, Patient Position: Sitting, Cuff Size: Normal)   Pulse (!) 55   Temp 98.1 F (36.7 C)   Wt 135 lb (61.2 kg)   SpO2 98%   BMI 23.91 kg/m    Subjective:    Patient ID: Lisa Jenkins, female    DOB: 1950/12/30, 67 y.o.   MRN: [id number]  HPI: Lisa Jenkins is a 67 y.o. female presenting on 03/19/2018 for No chief complaint on file.   HPI.    Pt here for follow up of diabetes, cholesterol, CAD....  Pt says she feels good today and insists she "is healthy as a horse"  Pt is still smoking but says only a little bit  Relevant past medical, surgical, family and social history reviewed and updated as indicated. Interim medical history since our last visit reviewed. Allergies and medications reviewed and updated.   Current Outpatient Medications:  .  amLODipine (NORVASC) 5 MG tablet, Take 5 mg by mouth 2 (two) times daily., Disp: , Rfl:  .  aspirin 81 MG chewable tablet, Chew 81 mg by mouth daily. , Disp: , Rfl:  .  atorvastatin (LIPITOR) 80 MG tablet, TAKE 1 Tablet BY MOUTH ONCE DAILY AT 6:00PM, Disp: 90 tablet, Rfl: 1 .  clopidogrel (PLAVIX) 75 MG tablet, TAKE 1 Tablet BY MOUTH ONCE DAILY WITH BREAKFAST, Disp: 90 tablet, Rfl: 1 .  insulin glargine (LANTUS) 100 UNIT/ML injection, Inject 20 Units into the skin at bedtime. , Disp: , Rfl:  .  lisinopril (PRINIVIL,ZESTRIL) 40 MG tablet, Take 40 mg by mouth daily., Disp: , Rfl:  .  metFORMIN (GLUCOPHAGE) 1000 MG tablet, TAKE 1 Tablet  BY MOUTH TWICE DAILY WITH MEALS, Disp: 180 tablet, Rfl: 1 .  metoprolol tartrate (LOPRESSOR) 25 MG tablet, Take 1 tablet (25 mg total) by mouth 2 (two) times daily., Disp: 180 tablet, Rfl: 1 .  SYNTHROID 25 MCG tablet, TAKE 1 Tablet BY MOUTH ONCE DAILY BEFORE BREAKFAST, Disp: 90 tablet, Rfl: 1   Review of Systems  Constitutional: Negative for appetite change, chills, diaphoresis, fatigue, fever and unexpected weight change.  HENT: Negative for congestion, dental problem,  drooling, ear pain, facial swelling, hearing loss, mouth sores, sneezing, sore throat, trouble swallowing and voice change.   Eyes: Negative for pain, discharge, redness, itching and visual disturbance.  Respiratory: Negative for cough, choking, shortness of breath and wheezing.   Cardiovascular: Negative for chest pain, palpitations and leg swelling.  Gastrointestinal: Negative for abdominal pain, blood in stool, constipation, diarrhea and vomiting.  Endocrine: Negative for cold intolerance, heat intolerance and polydipsia.  Genitourinary: Negative for decreased urine volume, dysuria and hematuria.  Musculoskeletal: Negative for arthralgias, back pain and gait problem.  Skin: Negative for rash.  Allergic/Immunologic: Negative for environmental allergies.  Neurological: Negative for seizures, syncope, light-headedness and headaches.  Hematological: Negative for adenopathy.  Psychiatric/Behavioral: Negative for agitation, dysphoric mood and suicidal ideas. The patient is not nervous/anxious.     Per HPI unless specifically indicated above     Objective:    BP 114/60 (BP Location: Left Arm, Patient Position: Sitting, Cuff Size: Normal)   Pulse (!) 55   Temp 98.1 F (36.7 C)   Wt 135 lb (61.2 kg)   SpO2 98%   BMI 23.91 kg/m   Wt Readings from Last 3 Encounters:  03/19/18 135 lb (61.2 kg)  02/05/18 139 lb (63 kg)  01/26/18 143 lb (64.9 kg)    Physical Exam  Constitutional: She is oriented to  person, place, and time. She appears well-developed and well-nourished.  HENT:  Head: Normocephalic and atraumatic.  Neck: Neck supple.  Cardiovascular: Normal rate and regular rhythm.  Pulmonary/Chest: Effort normal and breath sounds normal.  Abdominal: Soft. Bowel sounds are normal. She exhibits no mass. There is no hepatosplenomegaly. There is no tenderness.  Musculoskeletal: She exhibits no edema.  Lymphadenopathy:    She has no cervical adenopathy.  Neurological: She is alert and  oriented to person, place, and time.  Skin: Skin is warm and dry.  Psychiatric: She has a normal mood and affect. Her behavior is normal.  Vitals reviewed.       Assessment & Plan:    Encounter Diagnoses  Name Primary?  Marland Kitchen Uncontrolled type 2 diabetes mellitus with hyperglycemia ([place]) Yes  . Essential hypertension, benign   . Hyperlipidemia, unspecified hyperlipidemia type   . Coronary artery disease involving native coronary artery of native heart without angina pectoris   . Cigarette nicotine dependence with nicotine-induced disorder   . Hypothyroidism, unspecified type     -Pt to get fasting labs- lipids, a1c, cmp-  tomorrow morning.  TSH is stable and was checked in May.  She will be called with results -pt to continue current medications -counseled smoking cessation -pt encouraged to get a new pt appointment with new provider since it can sometimes take several months and her insurance starts in September -pt to [place] prior to that time if needed

## 2018-04-01 ENCOUNTER — Ambulatory Visit: Payer: Self-pay | Admitting: Cardiovascular Disease

## 2018-04-26 ENCOUNTER — Other Ambulatory Visit: Payer: Self-pay | Admitting: Physician Assistant

## 2018-05-27 ENCOUNTER — Other Ambulatory Visit: Payer: Self-pay | Admitting: Physician Assistant

## 2018-05-28 ENCOUNTER — Other Ambulatory Visit: Payer: Self-pay | Admitting: Physician Assistant

## 2018-05-28 MED ORDER — LISINOPRIL 40 MG PO TABS
40.0000 mg | ORAL_TABLET | Freq: Every day | ORAL | 0 refills | Status: DC
Start: 2018-05-28 — End: 2019-03-27

## 2018-06-19 ENCOUNTER — Ambulatory Visit (INDEPENDENT_AMBULATORY_CARE_PROVIDER_SITE_OTHER): Payer: Medicare HMO | Admitting: Cardiovascular Disease

## 2018-06-19 ENCOUNTER — Encounter: Payer: Self-pay | Admitting: Cardiovascular Disease

## 2018-06-19 VITALS — BP 134/68 | HR 70 | Ht 63.0 in | Wt 135.0 lb

## 2018-06-19 DIAGNOSIS — M7989 Other specified soft tissue disorders: Secondary | ICD-10-CM | POA: Diagnosis not present

## 2018-06-19 DIAGNOSIS — E785 Hyperlipidemia, unspecified: Secondary | ICD-10-CM | POA: Diagnosis not present

## 2018-06-19 DIAGNOSIS — Z716 Tobacco abuse counseling: Secondary | ICD-10-CM

## 2018-06-19 DIAGNOSIS — I252 Old myocardial infarction: Secondary | ICD-10-CM | POA: Diagnosis not present

## 2018-06-19 DIAGNOSIS — I25118 Atherosclerotic heart disease of native coronary artery with other forms of angina pectoris: Secondary | ICD-10-CM | POA: Diagnosis not present

## 2018-06-19 DIAGNOSIS — Z955 Presence of coronary angioplasty implant and graft: Secondary | ICD-10-CM

## 2018-06-19 DIAGNOSIS — I10 Essential (primary) hypertension: Secondary | ICD-10-CM | POA: Diagnosis not present

## 2018-06-19 MED ORDER — VARENICLINE TARTRATE 0.5 MG X 11 & 1 MG X 42 PO MISC: ORAL | 0 refills | Status: DC

## 2018-06-19 MED ORDER — POTASSIUM CHLORIDE CRYS ER 20 MEQ PO TBCR: 20.0000 meq | EXTENDED_RELEASE_TABLET | Freq: Every day | ORAL | 3 refills | Status: DC

## 2018-06-19 MED ORDER — CHLORTHALIDONE 25 MG PO TABS: 25.0000 mg | ORAL_TABLET | Freq: Every day | ORAL | 3 refills | Status: DC

## 2018-06-19 MED ORDER — VARENICLINE TARTRATE 1 MG PO TABS: 1.0000 mg | ORAL_TABLET | Freq: Two times a day (BID) | ORAL | 0 refills | Status: DC

## 2018-06-19 NOTE — Progress Notes (Signed)
SUBJECTIVE: The patient presents for past due follow up.   She has a history of non-STEMI with drug-eluting stent placement to the mid RCA in November 2018.  She has 50% disease in the proximal to mid LAD after the first diagonal.  She also has a history of hypertension, hyperlipidemia, insulin-dependent diabetes mellitus, and tobacco use.  She is here with her niece, [name].  [name] says that the patient sometimes eats one meal a day and sometimes none.  Patient says she has difficulty remembering that she needs to eat.  A pack of cigarettes generally lasts for 3 days.  She said Chantix was too costly as she did not have insurance at her initial visit with me but does have insurance now and would be willing to try quitting again.  She denies chest pain, palpitations, and shortness of breath.  She does have bilateral feet swelling.    Soc Hx: She lives with her niece, [name], and her niece's husband.  Review of Systems: As per "subjective", otherwise negative.  No Known Allergies  Current Outpatient Medications  Medication Sig Dispense Refill  . amLODipine (NORVASC) 5 MG tablet Take 5 mg by mouth 2 (two) times daily.    Marland Kitchen aspirin 81 MG chewable tablet Chew 81 mg by mouth daily.     Marland Kitchen atorvastatin (LIPITOR) 80 MG tablet TAKE 1 Tablet BY MOUTH ONCE DAILY AT 6:00PM 90 tablet 1  . clopidogrel (PLAVIX) 75 MG tablet TAKE 1 Tablet BY MOUTH ONCE DAILY WITH BREAKFAST 90 tablet 1  . insulin glargine (LANTUS) 100 UNIT/ML injection Inject 20 Units into the skin at bedtime.     Marland Kitchen lisinopril (PRINIVIL,ZESTRIL) 40 MG tablet Take 1 tablet (40 mg total) by mouth daily. 30 tablet 0  . metFORMIN (GLUCOPHAGE) 1000 MG tablet TAKE 1 Tablet  BY MOUTH TWICE DAILY WITH MEALS 60 tablet 0  . metoprolol tartrate (LOPRESSOR) 25 MG tablet TAKE 1 Tablet  BY MOUTH TWICE DAILY 60 tablet 0  . SYNTHROID 25 MCG tablet TAKE 1 Tablet BY MOUTH ONCE DAILY BEFORE BREAKFAST 90 tablet 1   No current  facility-administered medications for this visit.     Past Medical History:  Diagnosis Date  . Coronary artery disease   . Diabetes mellitus without complication ([place])   . High cholesterol   . Hypertension   . Myocardial infarction [facility])     Past Surgical History:  Procedure Laterality Date  . BUNIONECTOMY Bilateral   . CORONARY STENT INTERVENTION N/A 07/09/2017   Procedure: CORONARY STENT INTERVENTION;  Surgeon: Belva Crome, MD;  Location: [place] [facility];  Service: Cardiovascular;  Laterality: N/A;  . LEFT HEART CATH AND CORONARY ANGIOGRAPHY N/A 07/09/2017   Procedure: LEFT HEART CATH AND CORONARY ANGIOGRAPHY;  Surgeon: Belva Crome, MD;  Location: [place] [facility];  Service: Cardiovascular;  Laterality: N/A;    Social History   Socioeconomic History  . Marital status: Single    Spouse name: Not on file  . Number of children: Not on file  . Years of education: Not on file  . Highest education level: Not on file  Occupational History  . Not on file  Social Needs  . Financial resource strain: Not on file  . Food insecurity:    Worry: Not on file    Inability: Not on file  . Transportation needs:    Medical: Not on file    Non-medical: Not on file  Tobacco Use  . Smoking status:  Current Every Day Smoker    Packs/day: 0.25    Years: 46.00    Pack years: 11.50  . Smokeless tobacco: Never Used  Substance and Sexual Activity  . Alcohol use: No    Alcohol/week: 0.0 standard drinks  . Drug use: No  . Sexual activity: Not on file  Lifestyle  . Physical activity:    Days per week: Not on file    Minutes per session: Not on file  . Stress: Not on file  Relationships  . Social connections:    Talks on phone: Not on file    Gets together: Not on file    Attends religious service: Not on file    Active member of club or organization: Not on file    Attends meetings of clubs or organizations: Not on file    Relationship status: Not on file  . Intimate partner  violence:    Fear of current or ex partner: Not on file    Emotionally abused: Not on file    Physically abused: Not on file    Forced sexual activity: Not on file  Other Topics Concern  . Not on file  Social History Narrative  . Not on file     Vitals:   06/19/18 1322  BP: 134/68  Pulse: 70  SpO2: 98%  Weight: 135 lb (61.2 kg)  Height: 5' 3"  (1.6 m)    Wt Readings from Last 3 Encounters:  06/19/18 135 lb (61.2 kg)  03/19/18 135 lb (61.2 kg)  02/05/18 139 lb (63 kg)     PHYSICAL EXAM General: NAD HEENT: Normal. Neck: No JVD, no thyromegaly. Lungs: Clear to auscultation bilaterally with normal respiratory effort. CV: Regular rate and rhythm, normal S1/S2, no S3/S4, no murmur. No pretibial or periankle edema.  No carotid bruit.   Abdomen: Soft, nontender, no distention.  Neurologic: Alert and oriented.  Psych: Normal affect. Skin: Normal. Musculoskeletal: No gross deformities.    ECG: Reviewed above under Subjective   Labs: Lab Results  Component Value Date/Time   K 4.2 01/31/2018 09:44 AM   BUN 17 01/31/2018 09:44 AM   CREATININE 0.83 01/31/2018 09:44 AM   CREATININE 0.82 05/28/2016 10:10 AM   ALT 10 (L) 01/31/2018 09:44 AM   TSH 3.130 01/31/2018 09:44 AM   TSH 4.38 05/28/2016 10:10 AM   HGB 9.8 (L) 10/11/2017 07:08 PM   HGB 11.5 07/19/2017 11:28 AM     Lipids: Lab Results  Component Value Date/Time   LDLCALC 67 01/31/2018 09:44 AM   CHOL 126 01/31/2018 09:44 AM   TRIG 116 01/31/2018 09:44 AM   HDL 36 (L) 01/31/2018 09:44 AM       ASSESSMENT AND PLAN:  1.  Coronary disease with history of non-STEMI and drug-eluting stent placement to the RCA: Symptomatically stable.  Continue aspirin, Plavix, metoprolol, and Lipitor.  I will stop Plavix at the end of this month.  2.  Hypertension: Blood pressure is normal.  However, she complains of bilateral feet swelling which may be due to amlodipine.  I will stop amlodipine and start chlorthalidone 25 mg  daily along with potassium chloride 20 mEq daily.  I will check a basic metabolic panel within the next several days.  3.  Hyperlipidemia: Lipids reviewed above and at goal.  Continue Lipitor 80 mg.  4. Tobacco abuse: She is willing to try quitting.  I will prescribe a Chantix starter kit as she now has insurance. The patient was counseled on tobacco cessation today  for 3 minutes.  Counseling included reviewing the risks of smoking tobacco products, how it impacts the patient's current medical diagnoses and different strategies for quitting.  Pharmacotherapy to aid in tobacco cessation was prescribed today.  5.  Bilateral feet swelling: See discussion in #2.  I will stop amlodipine and start chlorthalidone.   Disposition: Follow up 6 months   Kate Sable, M.D., F.A.C.C.

## 2018-06-19 NOTE — Patient Instructions (Addendum)
Medication Instructions:  STOP AMLODIPINE  START CHLORTHALIDONE 25 MG DAILY START POTASSIUM 20 MEQ DAILY  START CHANTIX (TAKE AS DIRECTED)    STOP PLAVIX October 31ST.    Labwork: Monday BMET  Testing/Procedures: NONE  Follow-Up: Your physician wants you to follow-up in: 6 MONTHS  You will receive a reminder letter in the mail two months in advance. If you don't receive a letter, please call our office to schedule the follow-up appointment.\  Any Other Special Instructions Will Be Listed Below (If Applicable).     If you need a refill on your cardiac medications before your next appointment, please call your pharmacy.  av

## 2018-06-25 ENCOUNTER — Other Ambulatory Visit (HOSPITAL_COMMUNITY)
Admission: RE | Admit: 2018-06-25 | Discharge: 2018-06-25 | Disposition: A | Discharge status: Home / Self Care | Payer: Medicare HMO | Source: Ambulatory Visit | Attending: Cardiovascular Disease | Admitting: Cardiovascular Disease

## 2018-06-25 DIAGNOSIS — I10 Essential (primary) hypertension: Secondary | ICD-10-CM | POA: Insufficient documentation

## 2018-06-25 LAB — BASIC METABOLIC PANEL
ANION GAP: 8 (ref 5–15)
BUN: 19 mg/dL (ref 8–23)
CALCIUM: 9.3 mg/dL (ref 8.9–10.3)
CO2: 25 mmol/L (ref 22–32)
Chloride: 102 mmol/L (ref 98–111)
Creatinine, Ser: 0.75 mg/dL (ref 0.44–1.00)
GLUCOSE: 251 mg/dL — AB (ref 70–99)
POTASSIUM: 4.5 mmol/L (ref 3.5–5.1)
Sodium: 135 mmol/L (ref 135–145)

## 2018-06-27 DIAGNOSIS — Z7984 Long term (current) use of oral hypoglycemic drugs: Secondary | ICD-10-CM | POA: Diagnosis not present

## 2018-06-27 DIAGNOSIS — Z794 Long term (current) use of insulin: Secondary | ICD-10-CM | POA: Diagnosis not present

## 2018-06-27 DIAGNOSIS — I2510 Atherosclerotic heart disease of native coronary artery without angina pectoris: Secondary | ICD-10-CM | POA: Diagnosis not present

## 2018-06-27 DIAGNOSIS — Z23 Encounter for immunization: Secondary | ICD-10-CM | POA: Diagnosis not present

## 2018-06-27 DIAGNOSIS — E876 Hypokalemia: Secondary | ICD-10-CM | POA: Diagnosis not present

## 2018-06-27 DIAGNOSIS — E119 Type 2 diabetes mellitus without complications: Secondary | ICD-10-CM | POA: Diagnosis not present

## 2018-06-27 DIAGNOSIS — Z79899 Other long term (current) drug therapy: Secondary | ICD-10-CM | POA: Diagnosis not present

## 2018-06-27 DIAGNOSIS — E785 Hyperlipidemia, unspecified: Secondary | ICD-10-CM | POA: Diagnosis not present

## 2018-06-27 DIAGNOSIS — I10 Essential (primary) hypertension: Secondary | ICD-10-CM | POA: Diagnosis not present

## 2018-07-30 DIAGNOSIS — Z79899 Other long term (current) drug therapy: Secondary | ICD-10-CM | POA: Diagnosis not present

## 2018-07-30 DIAGNOSIS — E119 Type 2 diabetes mellitus without complications: Secondary | ICD-10-CM | POA: Diagnosis not present

## 2018-07-30 DIAGNOSIS — I10 Essential (primary) hypertension: Secondary | ICD-10-CM | POA: Diagnosis not present

## 2018-07-30 DIAGNOSIS — E785 Hyperlipidemia, unspecified: Secondary | ICD-10-CM | POA: Diagnosis not present

## 2018-07-31 DIAGNOSIS — Q845 Enlarged and hypertrophic nails: Secondary | ICD-10-CM | POA: Diagnosis not present

## 2018-07-31 DIAGNOSIS — E1165 Type 2 diabetes mellitus with hyperglycemia: Secondary | ICD-10-CM | POA: Diagnosis not present

## 2018-07-31 DIAGNOSIS — Z794 Long term (current) use of insulin: Secondary | ICD-10-CM | POA: Diagnosis not present

## 2018-07-31 DIAGNOSIS — E559 Vitamin D deficiency, unspecified: Secondary | ICD-10-CM | POA: Diagnosis not present

## 2018-07-31 DIAGNOSIS — E860 Dehydration: Secondary | ICD-10-CM | POA: Diagnosis not present

## 2018-07-31 DIAGNOSIS — Z0001 Encounter for general adult medical examination with abnormal findings: Secondary | ICD-10-CM | POA: Diagnosis not present

## 2018-07-31 DIAGNOSIS — E039 Hypothyroidism, unspecified: Secondary | ICD-10-CM | POA: Diagnosis not present

## 2018-07-31 DIAGNOSIS — Z7984 Long term (current) use of oral hypoglycemic drugs: Secondary | ICD-10-CM | POA: Diagnosis not present

## 2018-07-31 DIAGNOSIS — B351 Tinea unguium: Secondary | ICD-10-CM | POA: Diagnosis not present

## 2018-08-09 ENCOUNTER — Encounter: Payer: Self-pay | Admitting: *Deleted

## 2018-08-22 ENCOUNTER — Other Ambulatory Visit (HOSPITAL_COMMUNITY): Payer: Self-pay | Admitting: Family Medicine

## 2018-08-22 DIAGNOSIS — Z1231 Encounter for screening mammogram for malignant neoplasm of breast: Secondary | ICD-10-CM

## 2018-10-30 DIAGNOSIS — R278 Other lack of coordination: Secondary | ICD-10-CM | POA: Diagnosis not present

## 2018-10-30 DIAGNOSIS — I951 Orthostatic hypotension: Secondary | ICD-10-CM | POA: Diagnosis not present

## 2018-10-30 DIAGNOSIS — R202 Paresthesia of skin: Secondary | ICD-10-CM | POA: Diagnosis not present

## 2018-10-31 DIAGNOSIS — E039 Hypothyroidism, unspecified: Secondary | ICD-10-CM | POA: Diagnosis not present

## 2018-10-31 DIAGNOSIS — E119 Type 2 diabetes mellitus without complications: Secondary | ICD-10-CM | POA: Diagnosis not present

## 2018-10-31 DIAGNOSIS — E785 Hyperlipidemia, unspecified: Secondary | ICD-10-CM | POA: Diagnosis not present

## 2018-10-31 DIAGNOSIS — E876 Hypokalemia: Secondary | ICD-10-CM | POA: Diagnosis not present

## 2018-10-31 DIAGNOSIS — E860 Dehydration: Secondary | ICD-10-CM | POA: Diagnosis not present

## 2018-10-31 DIAGNOSIS — I10 Essential (primary) hypertension: Secondary | ICD-10-CM | POA: Diagnosis not present

## 2018-11-01 DIAGNOSIS — E039 Hypothyroidism, unspecified: Secondary | ICD-10-CM | POA: Diagnosis not present

## 2018-11-01 DIAGNOSIS — I10 Essential (primary) hypertension: Secondary | ICD-10-CM | POA: Diagnosis not present

## 2018-11-01 DIAGNOSIS — E860 Dehydration: Secondary | ICD-10-CM | POA: Diagnosis not present

## 2018-11-01 DIAGNOSIS — E876 Hypokalemia: Secondary | ICD-10-CM | POA: Diagnosis not present

## 2018-11-01 DIAGNOSIS — E119 Type 2 diabetes mellitus without complications: Secondary | ICD-10-CM | POA: Diagnosis not present

## 2018-11-04 DIAGNOSIS — E559 Vitamin D deficiency, unspecified: Secondary | ICD-10-CM | POA: Diagnosis not present

## 2018-11-04 DIAGNOSIS — Z794 Long term (current) use of insulin: Secondary | ICD-10-CM | POA: Diagnosis not present

## 2018-11-04 DIAGNOSIS — N183 Chronic kidney disease, stage 3 (moderate): Secondary | ICD-10-CM | POA: Diagnosis not present

## 2018-11-04 DIAGNOSIS — I129 Hypertensive chronic kidney disease with stage 1 through stage 4 chronic kidney disease, or unspecified chronic kidney disease: Secondary | ICD-10-CM | POA: Diagnosis not present

## 2018-11-04 DIAGNOSIS — E785 Hyperlipidemia, unspecified: Secondary | ICD-10-CM | POA: Diagnosis not present

## 2018-11-04 DIAGNOSIS — Z79899 Other long term (current) drug therapy: Secondary | ICD-10-CM | POA: Diagnosis not present

## 2018-11-04 DIAGNOSIS — E039 Hypothyroidism, unspecified: Secondary | ICD-10-CM | POA: Diagnosis not present

## 2018-11-04 DIAGNOSIS — E876 Hypokalemia: Secondary | ICD-10-CM | POA: Diagnosis not present

## 2018-11-04 DIAGNOSIS — E1165 Type 2 diabetes mellitus with hyperglycemia: Secondary | ICD-10-CM | POA: Diagnosis not present

## 2018-11-06 ENCOUNTER — Other Ambulatory Visit: Payer: Self-pay | Admitting: Physician Assistant

## 2018-11-08 ENCOUNTER — Other Ambulatory Visit: Payer: Self-pay | Admitting: Physician Assistant

## 2018-12-03 DIAGNOSIS — E1165 Type 2 diabetes mellitus with hyperglycemia: Secondary | ICD-10-CM | POA: Diagnosis not present

## 2018-12-03 DIAGNOSIS — Z79899 Other long term (current) drug therapy: Secondary | ICD-10-CM | POA: Diagnosis not present

## 2018-12-03 DIAGNOSIS — Z794 Long term (current) use of insulin: Secondary | ICD-10-CM | POA: Diagnosis not present

## 2018-12-03 DIAGNOSIS — Z7984 Long term (current) use of oral hypoglycemic drugs: Secondary | ICD-10-CM | POA: Diagnosis not present

## 2018-12-03 DIAGNOSIS — I10 Essential (primary) hypertension: Secondary | ICD-10-CM | POA: Diagnosis not present

## 2018-12-03 DIAGNOSIS — E785 Hyperlipidemia, unspecified: Secondary | ICD-10-CM | POA: Diagnosis not present

## 2019-03-27 ENCOUNTER — Other Ambulatory Visit: Payer: Self-pay

## 2019-03-27 ENCOUNTER — Ambulatory Visit (INDEPENDENT_AMBULATORY_CARE_PROVIDER_SITE_OTHER): Payer: Medicare HMO | Admitting: Student

## 2019-03-27 ENCOUNTER — Encounter: Payer: Self-pay | Admitting: Student

## 2019-03-27 VITALS — BP 121/62 | HR 66 | Temp 97.7°F | Ht 63.0 in | Wt 137.8 lb

## 2019-03-27 DIAGNOSIS — E785 Hyperlipidemia, unspecified: Secondary | ICD-10-CM

## 2019-03-27 DIAGNOSIS — I2510 Atherosclerotic heart disease of native coronary artery without angina pectoris: Secondary | ICD-10-CM | POA: Diagnosis not present

## 2019-03-27 DIAGNOSIS — Z79899 Other long term (current) drug therapy: Secondary | ICD-10-CM

## 2019-03-27 DIAGNOSIS — I10 Essential (primary) hypertension: Secondary | ICD-10-CM

## 2019-03-27 DIAGNOSIS — R600 Localized edema: Secondary | ICD-10-CM | POA: Diagnosis not present

## 2019-03-27 DIAGNOSIS — Z720 Tobacco use: Secondary | ICD-10-CM

## 2019-03-27 MED ORDER — CHLORTHALIDONE 25 MG PO TABS: 25.0000 mg | ORAL_TABLET | Freq: Every day | ORAL | 3 refills | Status: DC

## 2019-03-27 MED ORDER — FUROSEMIDE 20 MG PO TABS: ORAL_TABLET | ORAL | 3 refills | Status: DC

## 2019-03-27 NOTE — Patient Instructions (Signed)
Medication Instructions:  Your physician recommends that you continue on your current medications as directed. Please refer to the Current Medication list given to you today.  Start Lasix 20 mg Take Daily for 3 Days then Take Daily as needed for Edema   If you need a refill on your cardiac medications before your next appointment, please call your pharmacy.   Lab work: Your physician recommends that you return for lab work in: 1 Month   If you have labs (blood work) drawn today and your tests are completely normal, you will receive your results only by: Marland Kitchen MyChart Message (if you have MyChart) OR . A paper copy in the mail If you have any lab test that is abnormal or we need to change your treatment, we will call you to review the results.  Testing/Procedures: NONE   Follow-Up: At [facility], you and your health needs are our priority.  As part of our continuing mission to provide you with exceptional heart care, we have created designated Provider Care Teams.  These Care Teams include your primary Cardiologist (physician) and Advanced Practice Providers (APPs -  Physician Assistants and Nurse Practitioners) who all work together to provide you with the care you need, when you need it. You will need a follow up appointment in 3 months.  Please call our office 2 months in advance to schedule this appointment.  You may see Kate Sable, MD or one of the following Advanced Practice Providers on your designated Care Team:   Bernerd Pho, PA-C [facility]) . Ermalinda Barrios, PA-C ([place])  Any Other Special Instructions Will Be Listed Below (If Applicable). Thank you for choosing [facility]!

## 2019-03-27 NOTE — Progress Notes (Signed)
Cardiology Office Note    Date:  03/27/2019   ID:  Lisa Jenkins, DOB 05-Jul-1951, MRN [id number]  PCP:  Denyce Robert, FNP  Cardiologist: Kate Sable, MD    Chief Complaint  Patient presents with  . Follow-up    Lower Extremity Edema    History of Present Illness:    Lisa Jenkins is a 68 y.o. female with past medical history of CAD (s/p NSTEMI in 07/2017 with DES to mid-RCA and residual disease along proximal-mid LAD), HTN, HLD, IDDM, and tobacco use who presents to the office today for overdue follow-up.   She was last examined by Dr. Bronson Ing in 06/2018 and denied any recent chest pain or dyspnea on exertion. She did report worsening lower extremity edema which was thought to be secondary to Amlodipine, therefore this was discontinued and she was started on Chlorthalidone 25mg  daily. Was also started on Chantix given her continued tobacco use. She was informed to follow-up in 6 months but has not been evaluated since.   In talking with the patient today, she reports overall doing well from a cardiac perspective since her last office visit. She denies any recent chest pain or dyspnea on exertion. No recent orthopnea, PND, or palpitations. She does experience occasional lower extremity edema which is mostly along her feet and lower legs. She does not elevate her legs during the day as she does not have a recliner and she prefers not to wear compression stockings in the warm weather. Tries to monitor her sodium intake but says she still adds salt to some meals.   She does not check her BP regularly at home but it is well controlled at 121/62 during today's visit. Reports good compliance with her current medication regimen.    Past Medical History:  Diagnosis Date  . Coronary artery disease    a. s/p NSTEMI in 07/2017 with DES to mid-RCA and residual disease along proximal-mid LAD  . Diabetes mellitus without complication ([place])   . High cholesterol   . Hypertension   .  Myocardial infarction [facility])     Past Surgical History:  Procedure Laterality Date  . BUNIONECTOMY Bilateral   . CORONARY STENT INTERVENTION N/A 07/09/2017   Procedure: CORONARY STENT INTERVENTION;  Surgeon: Belva Crome, MD;  Location: [place] [facility];  Service: Cardiovascular;  Laterality: N/A;  . LEFT HEART CATH AND CORONARY ANGIOGRAPHY N/A 07/09/2017   Procedure: LEFT HEART CATH AND CORONARY ANGIOGRAPHY;  Surgeon: Belva Crome, MD;  Location: [place] [facility];  Service: Cardiovascular;  Laterality: N/A;    Current Medications: Outpatient Medications Prior to Visit  Medication Sig Dispense Refill  . aspirin 81 MG chewable tablet Chew 81 mg by mouth daily.     Marland Kitchen atorvastatin (LIPITOR) 80 MG tablet TAKE 1 Tablet BY MOUTH ONCE DAILY AT 6:00PM 90 tablet 1  . lisinopril (ZESTRIL) 20 MG tablet Take 20 mg by mouth daily.    . metFORMIN (GLUCOPHAGE) 1000 MG tablet TAKE 1 Tablet  BY MOUTH TWICE DAILY WITH MEALS 60 tablet 0  . metoprolol tartrate (LOPRESSOR) 25 MG tablet TAKE 1 Tablet  BY MOUTH TWICE DAILY 60 tablet 0  . OZEMPIC, 0.25 OR 0.5 MG/DOSE, 2 MG/1.5ML SOPN     . potassium chloride SA (K-DUR,KLOR-CON) 20 MEQ tablet Take 1 tablet (20 mEq total) by mouth daily. 90 tablet 3  . SYNTHROID 25 MCG tablet TAKE 1 Tablet BY MOUTH ONCE DAILY BEFORE BREAKFAST 90 tablet 1  . TRESIBA FLEXTOUCH  100 UNIT/ML SOPN FlexTouch Pen INJECT 16 UNITS SUBCUTANEOUSLY ONCE DAILY AT NIGHT FOR DIABETES DOSE DECREASE    . chlorthalidone (HYGROTON) 25 MG tablet Take 1 tablet (25 mg total) by mouth daily. 90 tablet 3  . insulin glargine (LANTUS) 100 UNIT/ML injection Inject 20 Units into the skin at bedtime.     . clopidogrel (PLAVIX) 75 MG tablet TAKE 1 Tablet BY MOUTH ONCE DAILY WITH BREAKFAST (Patient not taking: Reported on 03/27/2019) 90 tablet 1  . varenicline (CHANTIX CONTINUING MONTH PAK) 1 MG tablet Take 1 tablet (1 mg total) by mouth 2 (two) times daily. (Patient not taking: Reported on 03/27/2019) 154  tablet 0  . lisinopril (PRINIVIL,ZESTRIL) 40 MG tablet Take 1 tablet (40 mg total) by mouth daily. 30 tablet 0  . varenicline (CHANTIX STARTING MONTH PAK) 0.5 MG X 11 & 1 MG X 42 tablet Take one 0.5 mg tablet by mouth once daily for 3 days, then increase to one 0.5 mg tablet twice daily for 4 days, then increase to one 1 mg tablet twice daily. (Patient not taking: Reported on 03/27/2019) 53 tablet 0   No facility-administered medications prior to visit.      Allergies:   Patient has no known allergies.   Social History   Socioeconomic History  . Marital status: Single    Spouse name: Not on file  . Number of children: Not on file  . Years of education: Not on file  . Highest education level: Not on file  Occupational History  . Not on file  Social Needs  . Financial resource strain: Not on file  . Food insecurity    Worry: Not on file    Inability: Not on file  . Transportation needs    Medical: Not on file    Non-medical: Not on file  Tobacco Use  . Smoking status: Current Every Day Smoker    Packs/day: 0.25    Years: 46.00    Pack years: 11.50  . Smokeless tobacco: Never Used  Substance and Sexual Activity  . Alcohol use: No    Alcohol/week: 0.0 standard drinks  . Drug use: No  . Sexual activity: Not on file  Lifestyle  . Physical activity    Days per week: Not on file    Minutes per session: Not on file  . Stress: Not on file  Relationships  . Social Herbalist on phone: Not on file    Gets together: Not on file    Attends religious service: Not on file    Active member of club or organization: Not on file    Attends meetings of clubs or organizations: Not on file    Relationship status: Not on file  Other Topics Concern  . Not on file  Social History Narrative  . Not on file     Family History:  The patient's family history includes Diabetes in her brother, brother, brother, brother, brother, mother, and sister; Heart disease in her brother,  brother, brother, brother, brother, father, mother, and sister.   Review of Systems:   Please see the history of present illness.     General:  No chills, fever, night sweats or weight changes.  Cardiovascular:  No chest pain, dyspnea on exertion, orthopnea, palpitations, paroxysmal nocturnal dyspnea. Positive for lower extremity edema.  Dermatological: No rash, lesions/masses Respiratory: No cough, dyspnea Urologic: No hematuria, dysuria Abdominal:   No nausea, vomiting, diarrhea, bright red blood per rectum, melena, or hematemesis Neurologic:  No visual changes, wkns, changes in mental status. All other systems reviewed and are otherwise negative except as noted above.   Physical Exam:    VS:  BP 121/62   Pulse 66   Temp 97.7 F (36.5 C) (Temporal)   Ht 5\' 3"  (1.6 m)   Wt 137 lb 12.8 oz (62.5 kg)   SpO2 97%   BMI 24.41 kg/m    General: Well developed, well nourished,female appearing in no acute distress. Head: Normocephalic, atraumatic, sclera non-icteric, no xanthomas, nares are without discharge.  Neck: No carotid bruits. JVD not elevated.  Lungs: Respirations regular and unlabored, without wheezes or rales.  Heart: Regular rate and rhythm. No S3 or S4.  No murmur, no rubs, or gallops appreciated. Abdomen: Soft, non-tender, non-distended with normoactive bowel sounds. No hepatomegaly. No rebound/guarding. No obvious abdominal masses. Msk:  Strength and tone appear normal for age. No joint deformities or effusions. Extremities: No clubbing or cyanosis. Trace ankle edema bilaterally, most notable along LLE.  Distal pedal pulses are 2+ bilaterally. Neuro: Alert and oriented X 3. Moves all extremities spontaneously. No focal deficits noted. Psych:  Responds to questions appropriately with a normal affect. Skin: No rashes or lesions noted  Wt Readings from Last 3 Encounters:  03/27/19 137 lb 12.8 oz (62.5 kg)  06/19/18 135 lb (61.2 kg)  03/19/18 135 lb (61.2 kg)     Studies/Labs Reviewed:   EKG:  EKG is not ordered today.   Recent Labs: 06/25/2018: BUN 19; Creatinine, Ser 0.75; Potassium 4.5; Sodium 135   Lipid Panel    Component Value Date/Time   CHOL 126 01/31/2018 0944   TRIG 116 01/31/2018 0944   HDL 36 (L) 01/31/2018 0944   CHOLHDL 3.5 01/31/2018 0944   VLDL 23 01/31/2018 0944   LDLCALC 67 01/31/2018 0944    Additional studies/ records that were reviewed today include:   Cardiac Catheterization: 07/2017  High-grade obstruction of the mid right coronary serving as the culprit for the patient's presentation with acute coronary syndrome.  Successful drug-eluting stent implantation in the mid right coronary decreasing tubular segmental 90% stenosis to 0% with TIMI grade III flow using a 3.0 x 28 mm Synergy post dilating to 3.5 mm in diameter.  Widely patent left main, and circumflex.  Eccentric 50% proximal to mid LAD after the first diagonal.  Overall normal LV function.  EF estimated to be 55% with normal filling pressures.  RECOMMENDATIONS:  Aspirin and Plavix for 12 months.  Risk factor modification: Smoking cessation, aggressive lipid-lowering to LDL less than 70, blood pressure control, and screening for diabetes mellitus.  Eligible for discharge in a.m.  Assessment:    1. Coronary artery disease involving native coronary artery of native heart without angina pectoris   2. Lower extremity edema   3. Medication management   4. Hyperlipidemia LDL goal <70   5. Essential hypertension   6. Tobacco abuse      Plan:   In order of problems listed above:  1. CAD - she is s/p NSTEMI in 07/2017 with DES to mid-RCA and residual disease along proximal-mid LAD as outlined above. - she denies any recent chest pain or dyspnea on exertion. Continue current medication regimen with ASA, statin, and BB therapy. Plavix was discontinued in 07/2018.  2. Lower Extremity Edema - she is unsure if her symptoms improved with  discontinuation of Amlodipine. On examination, she has trace edema along her ankles but no significant pitting edema. Weight has overall been stable on  her home scales. She says her swelling is typically worse in the evening hours. I recommended she elevate her lower extremities if possible and continue to limit sodium intake. Will provide with Rx to take Lasix 20mg  for 3 days then only as needed given her mild symptoms and the concurrent use of Chlorthalidone. Will repeat a BMET in 1 month.  3. HLD - followed by her PCP. LDL was 67 in 2019. Will request most recent records from her PCP as they are not in [place]. Remains on Atorvastatin 80mg  daily.   4. HTN - BP is well controlled at 121/62 during today's visit. Continue current medication regimen with Chlorthalidone 25mg  daily, Lisinopril 20mg  daily, and Lopressor 25mg  BID.   5. Tobacco Use - she continues to smoke 0.5 ppd (previously smoking 2 ppd prior to her MI). She never started Chantix due to the possible side-effects. Not interested in pharmacologic options at this time. Congratulated on her reduction with cessation advised.   Medication Adjustments/Labs and Tests Ordered: Current medicines are reviewed at length with the patient today.  Concerns regarding medicines are outlined above.  Medication changes, Labs and Tests ordered today are listed in the Patient Instructions below. Patient Instructions  Medication Instructions:  Your physician recommends that you continue on your current medications as directed. Please refer to the Current Medication list given to you today.  Start Lasix 20 mg Take Daily for 3 Days then Take Daily as needed for Edema   If you need a refill on your cardiac medications before your next appointment, please call your pharmacy.   Lab work: Your physician recommends that you return for lab work in: 1 Month   If you have labs (blood work) drawn today and your tests are completely normal, you will receive your  results only by: Marland Kitchen MyChart Message (if you have MyChart) OR . A paper copy in the mail If you have any lab test that is abnormal or we need to change your treatment, we will call you to review the results.  Testing/Procedures: NONE   Follow-Up: At [facility], you and your health needs are our priority.  As part of our continuing mission to provide you with exceptional heart care, we have created designated Provider Care Teams.  These Care Teams include your primary Cardiologist (physician) and Advanced Practice Providers (APPs -  Physician Assistants and Nurse Practitioners) who all work together to provide you with the care you need, when you need it. You will need a follow up appointment in 3 months.  Please call our office 2 months in advance to schedule this appointment.  You may see Kate Sable, MD or one of the following Advanced Practice Providers on your designated Care Team:   Bernerd Pho, PA-C [facility]) . Ermalinda Barrios, PA-C ([place])  Any Other Special Instructions Will Be Listed Below (If Applicable). Thank you for choosing [facility]!     Signed, Erma Heritage, PA-C  03/27/2019 6:15 PM    [place] [address] [place], [place] [address] Phone: [phone] Fax: [phone]

## 2019-04-19 ENCOUNTER — Emergency Department (HOSPITAL_COMMUNITY)
Admission: EM | Admit: 2019-04-19 | Discharge: 2019-04-19 | Disposition: A | Discharge status: Home / Self Care | Payer: Medicare HMO | Attending: Emergency Medicine | Admitting: Emergency Medicine

## 2019-04-19 ENCOUNTER — Encounter (HOSPITAL_COMMUNITY): Payer: Self-pay | Admitting: Emergency Medicine

## 2019-04-19 ENCOUNTER — Other Ambulatory Visit: Payer: Self-pay

## 2019-04-19 DIAGNOSIS — F17200 Nicotine dependence, unspecified, uncomplicated: Secondary | ICD-10-CM | POA: Insufficient documentation

## 2019-04-19 DIAGNOSIS — I2510 Atherosclerotic heart disease of native coronary artery without angina pectoris: Secondary | ICD-10-CM | POA: Insufficient documentation

## 2019-04-19 DIAGNOSIS — E039 Hypothyroidism, unspecified: Secondary | ICD-10-CM | POA: Insufficient documentation

## 2019-04-19 DIAGNOSIS — Y929 Unspecified place or not applicable: Secondary | ICD-10-CM | POA: Insufficient documentation

## 2019-04-19 DIAGNOSIS — I10 Essential (primary) hypertension: Secondary | ICD-10-CM | POA: Insufficient documentation

## 2019-04-19 DIAGNOSIS — Z79899 Other long term (current) drug therapy: Secondary | ICD-10-CM | POA: Diagnosis not present

## 2019-04-19 DIAGNOSIS — S41101A Unspecified open wound of right upper arm, initial encounter: Secondary | ICD-10-CM

## 2019-04-19 DIAGNOSIS — Y9389 Activity, other specified: Secondary | ICD-10-CM | POA: Diagnosis not present

## 2019-04-19 DIAGNOSIS — Z7982 Long term (current) use of aspirin: Secondary | ICD-10-CM | POA: Diagnosis not present

## 2019-04-19 DIAGNOSIS — I252 Old myocardial infarction: Secondary | ICD-10-CM | POA: Diagnosis not present

## 2019-04-19 DIAGNOSIS — Y999 Unspecified external cause status: Secondary | ICD-10-CM | POA: Diagnosis not present

## 2019-04-19 DIAGNOSIS — Z7984 Long term (current) use of oral hypoglycemic drugs: Secondary | ICD-10-CM | POA: Insufficient documentation

## 2019-04-19 DIAGNOSIS — E119 Type 2 diabetes mellitus without complications: Secondary | ICD-10-CM | POA: Diagnosis not present

## 2019-04-19 DIAGNOSIS — Z23 Encounter for immunization: Secondary | ICD-10-CM | POA: Diagnosis not present

## 2019-04-19 DIAGNOSIS — W268XXA Contact with other sharp object(s), not elsewhere classified, initial encounter: Secondary | ICD-10-CM | POA: Insufficient documentation

## 2019-04-19 MED ORDER — TETANUS-DIPHTH-ACELL PERTUSSIS 5-2.5-18.5 LF-MCG/0.5 IM SUSP
0.5000 mL | Freq: Once | INTRAMUSCULAR | Status: AC
  Administered 2019-04-19: 0.5 mL via INTRAMUSCULAR
  Filled 2019-04-19: qty 0.5

## 2019-04-19 MED ORDER — CEPHALEXIN 250 MG PO CAPS: 250.0000 mg | ORAL_CAPSULE | Freq: Four times a day (QID) | ORAL | 0 refills | Status: AC

## 2019-04-19 MED ORDER — CEPHALEXIN 500 MG PO CAPS
500.0000 mg | ORAL_CAPSULE | Freq: Once | ORAL | Status: AC
  Administered 2019-04-19: 20:00:00 500 mg via ORAL
  Filled 2019-04-19: qty 1

## 2019-04-19 MED ORDER — CEPHALEXIN 250 MG PO CAPS: 250.0000 mg | ORAL_CAPSULE | Freq: Once | ORAL | Status: DC

## 2019-04-19 NOTE — ED Triage Notes (Signed)
Pt hit her right arm on a fence 2 weeks ago and states it has not healed. C/o of itching, draining and swelling

## 2019-04-19 NOTE — ED Provider Notes (Signed)
[facility] EMERGENCY DEPARTMENT Provider Note   CSN: [id number] Arrival date & time: 04/19/19  4098     History   Chief Complaint Chief Complaint  Patient presents with  . Arm Pain    HPI Lisa Jenkins is a 68 y.o. female presenting for evaluation of R arm swelling.  Pt states 2 weeks ago she was passing a lawnmower over a chain-link fence to her niece when she scraped the dorsal forearm again sthe fence and sustained a skin tear. Since then, she reports worsening swelling. She has been washing it daily and applying neosporin every day. Recently she has developed little blisters that itch and are draining. No pus-like drainage. No fevers or chills. No numbness. Pt with h/o DM, BGLs have been normal. Pt saw her PCP who did not feel her arm was infected, and did not start tx.  Pt does not know when her last tetanus shot was, but thinks its was >10 years ago.      HPI  Past Medical History:  Diagnosis Date  . Coronary artery disease    a. s/p NSTEMI in 07/2017 with DES to mid-RCA and residual disease along proximal-mid LAD  . Diabetes mellitus without complication ([place])   . High cholesterol   . Hypertension   . Myocardial infarction [facility])     Patient Active Problem List   Diagnosis Date Noted  . NSTEMI (non-ST elevated myocardial infarction) ([place]) 07/08/2017  . Community acquired pneumonia of right upper lobe of lung ([place])   . Chest pain 07/28/2016  . 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 Hypothyroidism 10/21/2015  . Uncontrolled type 2 diabetes mellitus with complication ([place]) 11/91/4782  . Type 2 diabetes mellitus with complication ([place]) 95/62/1308  . Hyperlipidemia 07/21/2015  . Essential hypertension, benign 07/21/2015  . Cigarette nicotine dependence, uncomplicated 65/78/4696  . Type 2 diabetes mellitus with retinopathy ([place]) 07/21/2015  . Proteinuria 07/21/2015  . Thyroid activity decreased 07/21/2015    Past Surgical History:  Procedure Laterality Date  .  BUNIONECTOMY Bilateral   . CORONARY STENT INTERVENTION N/A 07/09/2017   Procedure: CORONARY STENT INTERVENTION;  Surgeon: Belva Crome, MD;  Location: [place] [facility];  Service: Cardiovascular;  Laterality: N/A;  . LEFT HEART CATH AND CORONARY ANGIOGRAPHY N/A 07/09/2017   Procedure: LEFT HEART CATH AND CORONARY ANGIOGRAPHY;  Surgeon: Belva Crome, MD;  Location: [place] [facility];  Service: Cardiovascular;  Laterality: N/A;     OB History    Gravida      Para      Term      Preterm      AB      Living  0     SAB      TAB      Ectopic      Multiple      Live Births               Home Medications    Prior to Admission medications   Medication Sig Start Date End Date Taking? Authorizing Provider  aspirin 81 MG chewable tablet Chew 81 mg by mouth daily.     [provider]  atorvastatin (LIPITOR) 80 MG tablet TAKE 1 Tablet BY MOUTH ONCE DAILY AT 6:00PM 02/05/18   Soyla Dryer, PA-C  cephALEXin (KEFLEX) 250 MG capsule Take 1 capsule (250 mg total) by mouth 4 (four) times daily for 5 days. 04/19/19 04/24/19  Marqui Formby, PA-C  chlorthalidone (HYGROTON) 25 MG tablet Take 1 tablet (25  mg total) by mouth daily. 03/27/19 06/25/19  Strader, Fransisco Hertz, PA-C  furosemide (LASIX) 20 MG tablet Take Daily for 3 Days Then Take As Need Daily For Edema 03/27/19   Ahmed Prima, [place] M, PA-C  lisinopril (ZESTRIL) 20 MG tablet Take 20 mg by mouth daily.    [provider]  metFORMIN (GLUCOPHAGE) 1000 MG tablet TAKE 1 Tablet  BY MOUTH TWICE DAILY WITH MEALS 04/28/18   Soyla Dryer, PA-C  metoprolol tartrate (LOPRESSOR) 25 MG tablet TAKE 1 Tablet  BY MOUTH TWICE DAILY 04/28/18   Soyla Dryer, PA-C  OZEMPIC, 0.25 OR 0.5 MG/DOSE, 2 MG/1.5ML SOPN  10/18/18   [provider]  potassium chloride SA (K-DUR,KLOR-CON) 20 MEQ tablet Take 1 tablet (20 mEq total) by mouth daily. 06/19/18   Herminio Commons, MD  SYNTHROID 25 MCG tablet TAKE 1 Tablet BY  MOUTH ONCE DAILY BEFORE BREAKFAST 01/04/18   McElroy, Larene Beach, PA-C  TRESIBA FLEXTOUCH 100 UNIT/ML SOPN FlexTouch Pen INJECT 16 UNITS SUBCUTANEOUSLY ONCE DAILY AT NIGHT FOR DIABETES DOSE DECREASE 02/26/19   [provider]  varenicline (CHANTIX CONTINUING MONTH PAK) 1 MG tablet Take 1 tablet (1 mg total) by mouth 2 (two) times daily. Patient not taking: Reported on 03/27/2019 06/19/18   Herminio Commons, MD    Family History Family History  Problem Relation Age of Onset  . Diabetes Mother   . Heart disease Mother   . Heart disease Father   . Diabetes Sister   . Heart disease Sister   . Diabetes Brother   . Heart disease Brother   . Diabetes Brother   . Heart disease Brother   . Diabetes Brother   . Heart disease Brother   . Diabetes Brother   . Heart disease Brother   . Diabetes Brother   . Heart disease Brother     Social History Social History   Tobacco Use  . Smoking status: Current Every Day Smoker    Packs/day: 0.25    Years: 46.00    Pack years: 11.50  . Smokeless tobacco: Never Used  Substance Use Topics  . Alcohol use: No    Alcohol/week: 0.0 standard drinks  . Drug use: No     Allergies   Patient has no known allergies.   Review of Systems Review of Systems  Constitutional: Negative for fever.  Skin: Positive for wound.     Physical Exam Updated Vital Signs BP (!) 160/62   Pulse 63   Temp 98.5 F (36.9 C) (Oral)   Resp 18   Ht 5\' 3"  (1.6 m)   Wt 62.1 kg   SpO2 100%   BMI 24.27 kg/m   Physical Exam Vitals signs and nursing note reviewed.  Constitutional:      General: She is not in acute distress.    Appearance: She is well-developed.     Comments: Sitting comfortably in the bed in NAD  HENT:     Head: Normocephalic and atraumatic.  Neck:     Musculoskeletal: Normal range of motion.  Pulmonary:     Effort: Pulmonary effort is normal.  Abdominal:     General: There is no distension.  Musculoskeletal: Normal range of  motion.     Comments: Full active ROM of the wrist and elbow without pain. Grip strength intact. Radial pulse intact. Distal sensation intact.   Skin:    General: Skin is warm.     Capillary Refill: Capillary refill takes less than 2 seconds.  Findings: No rash.     Comments: See pictures below. Skin tear with clear drainage. Localized swelling. Small vesicles in various stages of healing of the distal forearm. Contusion noted of the proximal forearm. No purulent drainage.   Neurological:     Mental Status: She is alert and oriented to person, place, and time.        ED Treatments / Results  Labs (all labs ordered are listed, but only abnormal results are displayed) Labs Reviewed - No data to display  EKG None  Radiology No results found.  Procedures Procedures (including critical care time)  Medications Ordered in ED Medications  Tdap (BOOSTRIX) injection 0.5 mL (0.5 mLs Intramuscular Given 04/19/19 1929)  cephALEXin (KEFLEX) capsule 500 mg (500 mg Oral Given 04/19/19 1930)     Initial Impression / Assessment and Plan / ED Course  I have reviewed the triage vital signs and the nursing notes.  Pertinent labs & imaging results that were available during my care of the patient were reviewed by me and considered in my medical decision making (see chart for details).        Pt presenting for evaluation of R forearm wound. Physical exam reassuring, she is neurovascularly intact. No obvious infection at this time, however considering pts age, DM and swelling, will tx for possible early infection. tdap updated. vesicles and itching may be rxn to neosporin. Case discussed with attending, Dr. Roderic Palau evaluated the pt. discussed wound care and daily cleansing. Pt to stop using neosporin. At this time, pt appears safe for d/c. Return precautions given. Pt states she understands and agrees to plan.   Final Clinical Impressions(s) / ED Diagnoses   Final diagnoses:  Arm wound,  right, initial encounter    ED Discharge Orders         Ordered    cephALEXin (KEFLEX) 250 MG capsule  4 times daily     04/19/19 [place], Kitzia Camus, PA-C 04/19/19 1942    Milton Ferguson, MD 04/22/19 1312

## 2019-04-19 NOTE — Discharge Instructions (Signed)
Wash your arm with soap and water x2/day. Dry and apply a dry dressing.  Do not use neosporin.  Take antibiotics as prescribed.  Follow up with your doctor as needed for wound recheck.  Return to the ER if you develop fevers, pus draining from your arm, or with any new, worsening, or concerning symptoms.

## 2019-04-19 NOTE — ED Notes (Signed)
Non stick dressing applied, wrapped with conforming stretch bandage, secured with tape Supplies given to pt for home care

## 2019-05-14 ENCOUNTER — Other Ambulatory Visit: Payer: Self-pay

## 2019-05-14 ENCOUNTER — Telehealth: Payer: Self-pay | Admitting: Cardiovascular Disease

## 2019-05-14 ENCOUNTER — Encounter (HOSPITAL_COMMUNITY): Payer: Self-pay

## 2019-05-14 ENCOUNTER — Emergency Department (HOSPITAL_COMMUNITY)
Admission: EM | Admit: 2019-05-14 | Discharge: 2019-05-14 | Disposition: A | Discharge status: Home / Self Care | Payer: Medicare HMO | Attending: Emergency Medicine | Admitting: Emergency Medicine

## 2019-05-14 DIAGNOSIS — Z7984 Long term (current) use of oral hypoglycemic drugs: Secondary | ICD-10-CM | POA: Diagnosis not present

## 2019-05-14 DIAGNOSIS — Z7982 Long term (current) use of aspirin: Secondary | ICD-10-CM | POA: Insufficient documentation

## 2019-05-14 DIAGNOSIS — F17210 Nicotine dependence, cigarettes, uncomplicated: Secondary | ICD-10-CM | POA: Diagnosis not present

## 2019-05-14 DIAGNOSIS — E119 Type 2 diabetes mellitus without complications: Secondary | ICD-10-CM | POA: Insufficient documentation

## 2019-05-14 DIAGNOSIS — I2510 Atherosclerotic heart disease of native coronary artery without angina pectoris: Secondary | ICD-10-CM | POA: Insufficient documentation

## 2019-05-14 DIAGNOSIS — E039 Hypothyroidism, unspecified: Secondary | ICD-10-CM | POA: Insufficient documentation

## 2019-05-14 DIAGNOSIS — I10 Essential (primary) hypertension: Secondary | ICD-10-CM | POA: Insufficient documentation

## 2019-05-14 DIAGNOSIS — Z79899 Other long term (current) drug therapy: Secondary | ICD-10-CM | POA: Diagnosis not present

## 2019-05-14 DIAGNOSIS — R21 Rash and other nonspecific skin eruption: Secondary | ICD-10-CM | POA: Insufficient documentation

## 2019-05-14 LAB — CBG MONITORING, ED: Glucose-Capillary: 263 mg/dL — ABNORMAL HIGH (ref 70–99)

## 2019-05-14 MED ORDER — DIPHENHYDRAMINE HCL 25 MG PO CAPS
25.0000 mg | ORAL_CAPSULE | Freq: Once | ORAL | Status: AC
  Administered 2019-05-14: 15:00:00 25 mg via ORAL
  Filled 2019-05-14: qty 1

## 2019-05-14 MED ORDER — DIPHENHYDRAMINE HCL 25 MG PO TABS: 25.0000 mg | ORAL_TABLET | Freq: Four times a day (QID) | ORAL | 0 refills | Status: DC | PRN

## 2019-05-14 MED ORDER — DEXAMETHASONE SODIUM PHOSPHATE 10 MG/ML IJ SOLN
10.0000 mg | Freq: Once | INTRAMUSCULAR | Status: AC
  Administered 2019-05-14: 10 mg via INTRAMUSCULAR
  Filled 2019-05-14: qty 1

## 2019-05-14 MED ORDER — PREDNISONE 10 MG PO TABS: ORAL_TABLET | ORAL | 0 refills | Status: DC

## 2019-05-14 NOTE — ED Triage Notes (Signed)
Pt had a tetanus shot on August 18 and broke out in a rash a few days later. States itching. Was given cream to put on it for itching but no relief.

## 2019-05-14 NOTE — Telephone Encounter (Signed)
Error/tg °

## 2019-05-14 NOTE — ED Provider Notes (Signed)
[facility] EMERGENCY DEPARTMENT Provider Note   CSN: [id number] Arrival date & time: 05/14/19  1409     History   Chief Complaint Chief Complaint  Patient presents with  . Rash    HPI Lisa Jenkins is a 68 y.o. female.     HPI   Lisa Jenkins is a 68 y.o. female who presents to the Emergency Department complaining of persistent swelling to her right forearm and rash.  She states she was seen here for the swelling in her arm on 04/19/2019.  She was prescribed antibiotics and given a tetanus shot.  She is states that shortly after getting her tetanus shot that she noticed a rash to both forearms.  She states the areas are very pruritic.  She admits to scratching her arms until they bleed.  She was seen for this by her primary provider and prescribed a cream which she states provides temporary relief.  She denies fever, chills, chest pain or increased swelling of her arm since previous visit.  She denies any pain.  She has not been taking any Benadryl to control the itching.  She does admit to a family member having dogs in the house and states that the animals may have fleas.   Past Medical History:  Diagnosis Date  . Coronary artery disease    a. s/p NSTEMI in 07/2017 with DES to mid-RCA and residual disease along proximal-mid LAD  . Diabetes mellitus without complication ([place])   . High cholesterol   . Hypertension   . Myocardial infarction [facility])     Patient Active Problem List   Diagnosis Date Noted  . NSTEMI (non-ST elevated myocardial infarction) ([place]) 07/08/2017  . Community acquired pneumonia of right upper lobe of lung ([place])   . Chest pain 07/28/2016  . 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 Hypothyroidism 10/21/2015  . Uncontrolled type 2 diabetes mellitus with complication ([place]) [id number]  . Type 2 diabetes mellitus with complication ([place]) [id number]  . Hyperlipidemia 07/21/2015  . Essential hypertension, benign 07/21/2015  . Cigarette nicotine dependence,  uncomplicated [id number]  . Type 2 diabetes mellitus with retinopathy ([place]) 07/21/2015  . Proteinuria 07/21/2015  . Thyroid activity decreased 07/21/2015    Past Surgical History:  Procedure Laterality Date  . BUNIONECTOMY Bilateral   . CORONARY STENT INTERVENTION N/A 07/09/2017   Procedure: CORONARY STENT INTERVENTION;  Surgeon: Belva Crome, MD;  Location: [place] [facility];  Service: Cardiovascular;  Laterality: N/A;  . LEFT HEART CATH AND CORONARY ANGIOGRAPHY N/A 07/09/2017   Procedure: LEFT HEART CATH AND CORONARY ANGIOGRAPHY;  Surgeon: Belva Crome, MD;  Location: [place] [facility];  Service: Cardiovascular;  Laterality: N/A;     OB History    Gravida      Para      Term      Preterm      AB      Living  0     SAB      TAB      Ectopic      Multiple      Live Births               Home Medications    Prior to Admission medications   Medication Sig Start Date End Date Taking? Authorizing Provider  aspirin 81 MG chewable tablet Chew 81 mg by mouth daily.     [provider]  atorvastatin (LIPITOR) 80 MG tablet TAKE 1 Tablet BY MOUTH ONCE DAILY AT 6:00PM 02/05/18  Soyla Dryer, PA-C  chlorthalidone (HYGROTON) 25 MG tablet Take 1 tablet (25 mg total) by mouth daily. 03/27/19 06/25/19  Strader, Fransisco Hertz, PA-C  furosemide (LASIX) 20 MG tablet Take Daily for 3 Days Then Take As Need Daily For Edema 03/27/19   Ahmed Prima, [place] M, PA-C  lisinopril (ZESTRIL) 20 MG tablet Take 20 mg by mouth daily.    [provider]  metFORMIN (GLUCOPHAGE) 1000 MG tablet TAKE 1 Tablet  BY MOUTH TWICE DAILY WITH MEALS 04/28/18   Soyla Dryer, PA-C  metoprolol tartrate (LOPRESSOR) 25 MG tablet TAKE 1 Tablet  BY MOUTH TWICE DAILY 04/28/18   Soyla Dryer, PA-C  OZEMPIC, 0.25 OR 0.5 MG/DOSE, 2 MG/1.5ML SOPN  10/18/18   [provider]  potassium chloride SA (K-DUR,KLOR-CON) 20 MEQ tablet Take 1 tablet (20 mEq total) by mouth daily. 06/19/18    Herminio Commons, MD  SYNTHROID 25 MCG tablet TAKE 1 Tablet BY MOUTH ONCE DAILY BEFORE BREAKFAST 01/04/18   [place], Larene Beach, PA-C  TRESIBA FLEXTOUCH 100 UNIT/ML SOPN FlexTouch Pen INJECT 16 UNITS SUBCUTANEOUSLY ONCE DAILY AT NIGHT FOR DIABETES DOSE DECREASE 02/26/19   [provider]  varenicline (CHANTIX CONTINUING MONTH PAK) 1 MG tablet Take 1 tablet (1 mg total) by mouth 2 (two) times daily. Patient not taking: Reported on 03/27/2019 06/19/18   Herminio Commons, MD    Family History Family History  Problem Relation Age of Onset  . Diabetes Mother   . Heart disease Mother   . Heart disease Father   . Diabetes Sister   . Heart disease Sister   . Diabetes Brother   . Heart disease Brother   . Diabetes Brother   . Heart disease Brother   . Diabetes Brother   . Heart disease Brother   . Diabetes Brother   . Heart disease Brother   . Diabetes Brother   . Heart disease Brother     Social History Social History   Tobacco Use  . Smoking status: Current Every Day Smoker    Packs/day: 0.25    Years: 46.00    Pack years: 11.50  . Smokeless tobacco: Never Used  Substance Use Topics  . Alcohol use: No    Alcohol/week: 0.0 standard drinks  . Drug use: No     Allergies   Patient has no known allergies.   Review of Systems Review of Systems  Constitutional: Negative for activity change, appetite change, chills and fever.  HENT: Negative for facial swelling.   Respiratory: Negative for chest tightness, shortness of breath and wheezing.   Cardiovascular: Negative for chest pain.  Musculoskeletal: Negative for neck pain and neck stiffness.  Skin: Positive for rash. Negative for color change and wound.  Neurological: Negative for dizziness, weakness, numbness and headaches.     Physical Exam Updated Vital Signs BP 130/86 (BP Location: Right Arm)   Pulse 73   Temp 98.6 F (37 C) (Oral)   Resp 16   Ht 5\' 3"  (1.6 m)   Wt 62.6 kg   SpO2 100%   BMI 24.45  kg/m   Physical Exam Vitals signs and nursing note reviewed.  Constitutional:      General: She is not in acute distress.    Appearance: Normal appearance. She is well-developed. She is not toxic-appearing.  HENT:     Head: Atraumatic.     Mouth/Throat:     Mouth: Mucous membranes are moist.     Comments: Uvula is midline and nonedematous.  No oral  lesions. Neck:     Musculoskeletal: Normal range of motion and neck supple.  Cardiovascular:     Rate and Rhythm: Normal rate and regular rhythm.     Pulses: Normal pulses.     Comments: Radial pulses are strong and symmetrical bilaterally.  Easily palpated. Pulmonary:     Effort: Pulmonary effort is normal. No respiratory distress.     Breath sounds: Normal breath sounds. No wheezing.  Musculoskeletal: Normal range of motion.        General: No tenderness.  Lymphadenopathy:     Cervical: No cervical adenopathy.  Skin:    General: Skin is warm.     Capillary Refill: Capillary refill takes less than 2 seconds.     Findings: Erythema and rash present.     Comments: Multiple areas of excoriation to the bilateral forearms.  No involvement of the upper arm or dorsal hands.  Palms are also spared.  No appreciable unilateral swelling of the extremities.  Compartments are soft.  No significant erythema or excessive warmth.  Neurological:     Mental Status: She is alert and oriented to person, place, and time.     Motor: No abnormal muscle tone.     Coordination: Coordination normal.      ED Treatments / Results  Labs (all labs ordered are listed, but only abnormal results are displayed) Labs Reviewed  CBG MONITORING, ED - Abnormal; Notable for the following components:      Result Value   Glucose-Capillary 263 (*)    All other components within normal limits    EKG None  Radiology No results found.  Procedures Procedures (including critical care time)  Medications Ordered in ED Medications  diphenhydrAMINE (BENADRYL)  capsule 25 mg (25 mg Oral Given 05/14/19 1511)  dexamethasone (DECADRON) injection 10 mg (10 mg Intramuscular Given 05/14/19 1511)     Initial Impression / Assessment and Plan / ED Course  I have reviewed the triage vital signs and the nursing notes.  Pertinent labs & imaging results that were available during my care of the patient were reviewed by me and considered in my medical decision making (see chart for details).        Patient with multiple areas of excoriations to both forearms.  Upper arm hands and fingers are spared.  No rash of the face, trunk, or lower extremities.  Patient is well-appearing nontoxic.  Rash appears consistent with dermatitis versus flea bites.  Low clinical suspicion for cellulitis.  Neurovascularly intact.  Patient is diabetic, will give steroid taper with instructions for close observation of blood sugar and she agrees to monitor closely.  Patient also seen by Dr. Roderic Palau and care plan discussed. Final Clinical Impressions(s) / ED Diagnoses   Final diagnoses:  Rash and nonspecific skin eruption    ED Discharge Orders    None       Kem Parkinson, PA-C 05/14/19 1651    Milton Ferguson, MD 05/16/19 1104

## 2019-05-14 NOTE — Discharge Instructions (Addendum)
Start the prednisone prescription tomorrow.  You will need to monitor your blood sugar closely as the prednisone may cause her blood sugar to become elevated.  Stop taking the prednisone if your sugars are greater than 400.

## 2019-07-01 ENCOUNTER — Telehealth (INDEPENDENT_AMBULATORY_CARE_PROVIDER_SITE_OTHER): Payer: Medicare HMO | Admitting: Cardiovascular Disease

## 2019-07-01 ENCOUNTER — Encounter: Payer: Self-pay | Admitting: Cardiovascular Disease

## 2019-07-01 VITALS — BP 143/88 | HR 98 | Ht 63.0 in | Wt 138.0 lb

## 2019-07-01 DIAGNOSIS — Z955 Presence of coronary angioplasty implant and graft: Secondary | ICD-10-CM

## 2019-07-01 DIAGNOSIS — I10 Essential (primary) hypertension: Secondary | ICD-10-CM

## 2019-07-01 DIAGNOSIS — I25118 Atherosclerotic heart disease of native coronary artery with other forms of angina pectoris: Secondary | ICD-10-CM | POA: Diagnosis not present

## 2019-07-01 DIAGNOSIS — Z720 Tobacco use: Secondary | ICD-10-CM

## 2019-07-01 DIAGNOSIS — E785 Hyperlipidemia, unspecified: Secondary | ICD-10-CM | POA: Diagnosis not present

## 2019-07-01 NOTE — Progress Notes (Signed)
Virtual Visit via Telephone Note   This visit type was conducted due to national recommendations for restrictions regarding the COVID-19 Pandemic (e.g. social distancing) in an effort to limit this patient's exposure and mitigate transmission in our community.  Due to her co-morbid illnesses, this patient is at least at moderate risk for complications without adequate follow up.  This format is felt to be most appropriate for this patient at this time.  The patient did not have access to video technology/had technical difficulties with video requiring transitioning to audio format only (telephone).  All issues noted in this document were discussed and addressed.  No physical exam could be performed with this format.  Please refer to the patient's chart for her  consent to telehealth for [facility].   Date:  07/01/2019   ID:  Lisa Jenkins, DOB Mar 16, 1951, MRN [id number]  Patient Location: Home Provider Location: Office  PCP:  Denyce Robert, FNP  Cardiologist:  Kate Sable, MD  Electrophysiologist:  None   Evaluation Performed:  Follow-Up Visit  Chief Complaint:  CAD  History of Present Illness:    Lisa Jenkins is a 68 y.o. female with a history of non-STEMI with drug-eluting stent placement to the mid RCA in November 2018. She has 50% disease in the proximal to mid LAD after the first diagonal. She also has a history of hypertension, hyperlipidemia, insulin-dependent diabetes mellitus, and tobacco use.  She denies chest pain, palpitations and shortness of breath. She has very little ankle swelling bilaterally, right moreso than left.  Soc Hx: She lives with her niece, Lisa Jenkins, and her niece's husband.   Past Medical History:  Diagnosis Date  . Coronary artery disease    a. s/p NSTEMI in 07/2017 with DES to mid-RCA and residual disease along proximal-mid LAD  . Diabetes mellitus without complication ([place])   . High cholesterol   . Hypertension   . Myocardial infarction  [facility])    Past Surgical History:  Procedure Laterality Date  . BUNIONECTOMY Bilateral   . CORONARY STENT INTERVENTION N/A 07/09/2017   Procedure: CORONARY STENT INTERVENTION;  Surgeon: Belva Crome, MD;  Location: [place] [facility];  Service: Cardiovascular;  Laterality: N/A;  . LEFT HEART CATH AND CORONARY ANGIOGRAPHY N/A 07/09/2017   Procedure: LEFT HEART CATH AND CORONARY ANGIOGRAPHY;  Surgeon: Belva Crome, MD;  Location: [place] [facility];  Service: Cardiovascular;  Laterality: N/A;     Current Meds  Medication Sig  . aspirin 81 MG chewable tablet Chew 81 mg by mouth daily.   Marland Kitchen atorvastatin (LIPITOR) 80 MG tablet TAKE 1 Tablet BY MOUTH ONCE DAILY AT 6:00PM  . chlorthalidone (HYGROTON) 25 MG tablet Take 1 tablet (25 mg total) by mouth daily.  . diphenhydrAMINE (BENADRYL) 25 MG tablet Take 1 tablet (25 mg total) by mouth every 6 (six) hours as needed for itching.  . furosemide (LASIX) 20 MG tablet Take Daily for 3 Days Then Take As Need Daily For Edema  . lisinopril (ZESTRIL) 20 MG tablet Take 20 mg by mouth daily.  . metFORMIN (GLUCOPHAGE) 1000 MG tablet TAKE 1 Tablet  BY MOUTH TWICE DAILY WITH MEALS  . metoprolol tartrate (LOPRESSOR) 25 MG tablet TAKE 1 Tablet  BY MOUTH TWICE DAILY  . OZEMPIC, 0.25 OR 0.5 MG/DOSE, 2 MG/1.5ML SOPN once a week.   . potassium chloride SA (K-DUR,KLOR-CON) 20 MEQ tablet Take 1 tablet (20 mEq total) by mouth daily.  Marland Kitchen SYNTHROID 25 MCG tablet TAKE 1 Tablet BY  MOUTH ONCE DAILY BEFORE BREAKFAST  . TRESIBA FLEXTOUCH 100 UNIT/ML SOPN FlexTouch Pen INJECT 16 UNITS SUBCUTANEOUSLY ONCE DAILY AT NIGHT FOR DIABETES DOSE DECREASE     Allergies:   Patient has no known allergies.   Social History   Tobacco Use  . Smoking status: Current Every Day Smoker    Packs/day: 0.25    Years: 46.00    Pack years: 11.50  . Smokeless tobacco: Never Used  Substance Use Topics  . Alcohol use: No    Alcohol/week: 0.0 standard drinks  . Drug use: No     Family Hx:  The patient's family history includes Diabetes in her brother, brother, brother, brother, brother, mother, and sister; Heart disease in her brother, brother, brother, brother, brother, father, mother, and sister.  ROS:   Please see the history of present illness.     All other systems reviewed and are negative.   Prior CV studies:   The following studies were reviewed today:  Cardiac Catheterization: 07/2017  High-grade obstruction of the mid right coronary serving as the culprit for the patient's presentation with acute coronary syndrome.  Successful drug-eluting stent implantation in the mid right coronary decreasing tubular segmental 90% stenosis to 0% with TIMI grade III flow using a 3.0 x 28 mm Synergy post dilating to 3.5 mm in diameter.  Widely patent left main, and circumflex.  Eccentric 50% proximal to mid LAD after the first diagonal.  Overall normal LV function. EF estimated to be 55% with normal filling pressures.  RECOMMENDATIONS:  Aspirin and Plavix for 12 months.  Risk factor modification: Smoking cessation, aggressive lipid-lowering to LDL less than 70, blood pressure control, and screening for diabetes mellitus.  Eligible for discharge in a.m.   Labs/Other Tests and Data Reviewed:    EKG:  No ECG reviewed.  Recent Labs: No results found for requested labs within last 8760 hours.   Recent Lipid Panel Lab Results  Component Value Date/Time   CHOL 126 01/31/2018 09:44 AM   TRIG 116 01/31/2018 09:44 AM   HDL 36 (L) 01/31/2018 09:44 AM   CHOLHDL 3.5 01/31/2018 09:44 AM   LDLCALC 67 01/31/2018 09:44 AM    Wt Readings from Last 3 Encounters:  07/01/19 138 lb (62.6 kg)  05/14/19 138 lb (62.6 kg)  04/19/19 137 lb (62.1 kg)     Objective:    Vital Signs:  BP (!) 143/88   Pulse 98   Ht 5\' 3"  (1.6 m)   Wt 138 lb (62.6 kg)   BMI 24.45 kg/m    VITAL SIGNS:  reviewed  ASSESSMENT & PLAN:    1. Coronary disease with history of non-STEMI and  drug-eluting stent placement to the RCA: Symptomatically stable. Continue aspirin, metoprolol and Lipitor.   2. Hypertension: Blood pressure is mildly elevated. No changes today.  3. Hyperlipidemia: Will obtain copy of lipids from PCP. Continue Lipitor 80 mg.  4.Tobacco abuse: She smokes one pack over days. Needs cessation.    COVID-19 Education: The signs and symptoms of COVID-19 were discussed with the patient and how to seek care for testing (follow up with PCP or arrange E-visit).  The importance of social distancing was discussed today.  Time:   Today, I have spent 5 minutes with the patient with telehealth technology discussing the above problems.     Medication Adjustments/Labs and Tests Ordered: Current medicines are reviewed at length with the patient today.  Concerns regarding medicines are outlined above.   Tests Ordered: No orders of  the defined types were placed in this encounter.   Medication Changes: No orders of the defined types were placed in this encounter.   Follow Up:  Either In Person or Virtual in 6 month(s)  Signed, Kate Sable, MD  07/01/2019 11:11 AM    [place]

## 2019-07-01 NOTE — Patient Instructions (Addendum)
   Medication Instructions:  Your physician recommends that you continue on your current medications as directed. Please refer to the Current Medication list given to you today.  *If you need a refill on your cardiac medications before your next appointment, please call your pharmacy*  Lab Work: None today If you have labs (blood work) drawn today and your tests are completely normal, you will receive your results only by: Marland Kitchen MyChart Message (if you have MyChart) OR . A paper copy in the mail If you have any lab test that is abnormal or we need to change your treatment, we will call you to review the results.  Testing/Procedures: None today  Follow-Up: At [facility], you and your health needs are our priority.  As part of our continuing mission to provide you with exceptional heart care, we have created designated Provider Care Teams.  These Care Teams include your primary Cardiologist (physician) and Advanced Practice Providers (APPs -  Physician Assistants and Nurse Practitioners) who all work together to provide you with the care you need, when you need it.  Your next appointment:   6 months  The format for your next appointment:   Either In Person or Virtual  Provider:   Kate Sable, MD  Other Instructions None

## 2019-07-02 ENCOUNTER — Other Ambulatory Visit: Payer: Self-pay | Admitting: Cardiovascular Disease

## 2019-07-03 ENCOUNTER — Other Ambulatory Visit: Payer: Self-pay

## 2019-07-03 MED ORDER — POTASSIUM CHLORIDE CRYS ER 20 MEQ PO TBCR: 20.0000 meq | EXTENDED_RELEASE_TABLET | Freq: Every day | ORAL | 0 refills | Status: DC

## 2019-10-20 ENCOUNTER — Encounter: Payer: Medicare HMO | Admitting: Adult Health

## 2019-10-23 ENCOUNTER — Encounter: Payer: Medicare HMO | Admitting: Adult Health

## 2019-11-20 ENCOUNTER — Other Ambulatory Visit (HOSPITAL_COMMUNITY): Payer: Self-pay | Admitting: Family Medicine

## 2019-11-20 DIAGNOSIS — Z780 Asymptomatic menopausal state: Secondary | ICD-10-CM

## 2019-11-20 DIAGNOSIS — Z1231 Encounter for screening mammogram for malignant neoplasm of breast: Secondary | ICD-10-CM

## 2019-11-24 ENCOUNTER — Encounter: Payer: Self-pay | Admitting: *Deleted

## 2019-11-26 ENCOUNTER — Other Ambulatory Visit (HOSPITAL_COMMUNITY): Payer: Medicaid Other

## 2019-11-26 ENCOUNTER — Ambulatory Visit (HOSPITAL_COMMUNITY): Payer: Medicaid Other

## 2019-12-04 ENCOUNTER — Other Ambulatory Visit (HOSPITAL_COMMUNITY): Payer: Self-pay | Admitting: Family Medicine

## 2019-12-04 ENCOUNTER — Other Ambulatory Visit (HOSPITAL_COMMUNITY): Payer: Self-pay | Admitting: Adult Health

## 2019-12-04 DIAGNOSIS — R928 Other abnormal and inconclusive findings on diagnostic imaging of breast: Secondary | ICD-10-CM

## 2019-12-04 DIAGNOSIS — Z09 Encounter for follow-up examination after completed treatment for conditions other than malignant neoplasm: Secondary | ICD-10-CM

## 2019-12-12 ENCOUNTER — Other Ambulatory Visit (HOSPITAL_COMMUNITY): Payer: Medicaid Other

## 2019-12-12 ENCOUNTER — Ambulatory Visit (HOSPITAL_COMMUNITY): Payer: Medicaid Other

## 2019-12-23 ENCOUNTER — Ambulatory Visit (HOSPITAL_COMMUNITY): Admission: RE | Admit: 2019-12-23 | Payer: Medicare HMO | Source: Ambulatory Visit

## 2019-12-23 ENCOUNTER — Other Ambulatory Visit (HOSPITAL_COMMUNITY): Payer: Medicaid Other

## 2019-12-23 ENCOUNTER — Encounter (HOSPITAL_COMMUNITY): Payer: Self-pay

## 2019-12-23 ENCOUNTER — Ambulatory Visit (HOSPITAL_COMMUNITY): Payer: Medicare HMO | Attending: Adult Health

## 2020-01-07 ENCOUNTER — Emergency Department (HOSPITAL_COMMUNITY): Payer: Medicare HMO

## 2020-01-07 ENCOUNTER — Encounter (HOSPITAL_COMMUNITY): Payer: Self-pay | Admitting: *Deleted

## 2020-01-07 ENCOUNTER — Ambulatory Visit: Payer: Medicaid Other

## 2020-01-07 ENCOUNTER — Observation Stay (HOSPITAL_BASED_OUTPATIENT_CLINIC_OR_DEPARTMENT_OTHER)
Admission: EM | Admit: 2020-01-07 | Discharge: 2020-01-08 | Disposition: A | Discharge status: Home / Self Care | Payer: Medicare HMO | Source: Home / Self Care | Attending: Emergency Medicine | Admitting: Emergency Medicine

## 2020-01-07 ENCOUNTER — Other Ambulatory Visit: Payer: Self-pay

## 2020-01-07 DIAGNOSIS — E875 Hyperkalemia: Secondary | ICD-10-CM | POA: Insufficient documentation

## 2020-01-07 DIAGNOSIS — R1031 Right lower quadrant pain: Secondary | ICD-10-CM | POA: Diagnosis not present

## 2020-01-07 DIAGNOSIS — R1909 Other intra-abdominal and pelvic swelling, mass and lump: Secondary | ICD-10-CM

## 2020-01-07 DIAGNOSIS — F17210 Nicotine dependence, cigarettes, uncomplicated: Secondary | ICD-10-CM | POA: Insufficient documentation

## 2020-01-07 DIAGNOSIS — Z7984 Long term (current) use of oral hypoglycemic drugs: Secondary | ICD-10-CM | POA: Insufficient documentation

## 2020-01-07 DIAGNOSIS — Z9861 Coronary angioplasty status: Secondary | ICD-10-CM | POA: Insufficient documentation

## 2020-01-07 DIAGNOSIS — Z79899 Other long term (current) drug therapy: Secondary | ICD-10-CM | POA: Insufficient documentation

## 2020-01-07 DIAGNOSIS — R1900 Intra-abdominal and pelvic swelling, mass and lump, unspecified site: Secondary | ICD-10-CM | POA: Insufficient documentation

## 2020-01-07 DIAGNOSIS — E119 Type 2 diabetes mellitus without complications: Secondary | ICD-10-CM | POA: Insufficient documentation

## 2020-01-07 DIAGNOSIS — I119 Hypertensive heart disease without heart failure: Secondary | ICD-10-CM | POA: Insufficient documentation

## 2020-01-07 DIAGNOSIS — Z20822 Contact with and (suspected) exposure to covid-19: Secondary | ICD-10-CM | POA: Insufficient documentation

## 2020-01-07 DIAGNOSIS — I2510 Atherosclerotic heart disease of native coronary artery without angina pectoris: Secondary | ICD-10-CM | POA: Insufficient documentation

## 2020-01-07 LAB — COMPREHENSIVE METABOLIC PANEL
ALT: 9 U/L (ref 0–44)
AST: 10 U/L — ABNORMAL LOW (ref 15–41)
Albumin: 3.3 g/dL — ABNORMAL LOW (ref 3.5–5.0)
Alkaline Phosphatase: 124 U/L (ref 38–126)
Anion gap: 6 (ref 5–15)
BUN: 20 mg/dL (ref 8–23)
CO2: 16 mmol/L — ABNORMAL LOW (ref 22–32)
Calcium: 9.3 mg/dL (ref 8.9–10.3)
Chloride: 112 mmol/L — ABNORMAL HIGH (ref 98–111)
Creatinine, Ser: 1.02 mg/dL — ABNORMAL HIGH (ref 0.44–1.00)
GFR calc Af Amer: >60 mL/min (ref >60)
GFR calc non Af Amer: 57 mL/min — ABNORMAL LOW (ref >60)
Glucose, Bld: 110 mg/dL — ABNORMAL HIGH (ref 70–99)
Potassium: >7.5 mmol/L (ref 3.5–5.1)
Sodium: 134 mmol/L — ABNORMAL LOW (ref 135–145)
Total Bilirubin: 0.4 mg/dL (ref 0.3–1.2)
Total Protein: 7 g/dL (ref 6.5–8.1)

## 2020-01-07 LAB — CBC WITH DIFFERENTIAL/PLATELET
Abs Immature Granulocytes: 0.07 10*3/uL (ref 0.00–0.07)
Basophils Absolute: 0.1 10*3/uL (ref 0.0–0.1)
Basophils Relative: 1 %
Eosinophils Absolute: 0.1 10*3/uL (ref 0.0–0.5)
Eosinophils Relative: 1 %
HCT: 35.4 % — ABNORMAL LOW (ref 36.0–46.0)
Hemoglobin: 11 g/dL — ABNORMAL LOW (ref 12.0–15.0)
Immature Granulocytes: 1 %
Lymphocytes Relative: 16 %
Lymphs Abs: 2.2 10*3/uL (ref 0.7–4.0)
MCH: 30.3 pg (ref 26.0–34.0)
MCHC: 31.1 g/dL (ref 30.0–36.0)
MCV: 97.5 fL (ref 80.0–100.0)
Monocytes Absolute: 1.1 10*3/uL — ABNORMAL HIGH (ref 0.1–1.0)
Monocytes Relative: 8 %
Neutro Abs: 10.6 10*3/uL — ABNORMAL HIGH (ref 1.7–7.7)
Neutrophils Relative %: 73 %
Platelets: 419 10*3/uL — ABNORMAL HIGH (ref 150–400)
RBC: 3.63 MIL/uL — ABNORMAL LOW (ref 3.87–5.11)
RDW: 12.7 % (ref 11.5–15.5)
WBC: 14.2 10*3/uL — ABNORMAL HIGH (ref 4.0–10.5)
nRBC: 0 % (ref 0.0–0.2)

## 2020-01-07 LAB — MAGNESIUM: Magnesium: 1.3 mg/dL — ABNORMAL LOW (ref 1.7–2.4)

## 2020-01-07 LAB — POTASSIUM: Potassium: >7.5 mmol/L (ref 3.5–5.1)

## 2020-01-07 LAB — LIPASE, BLOOD: Lipase: 40 U/L (ref 11–51)

## 2020-01-07 MED ORDER — SODIUM BICARBONATE 8.4 % IV SOLN
INTRAVENOUS | Status: AC
  Administered 2020-01-07: 50 meq via INTRAVENOUS
  Filled 2020-01-07: qty 50

## 2020-01-07 MED ORDER — SODIUM CHLORIDE 0.9 % IV SOLN
1.0000 g | Freq: Once | INTRAVENOUS | Status: AC
  Administered 2020-01-07: 1 g via INTRAVENOUS
  Filled 2020-01-07: qty 10

## 2020-01-07 MED ORDER — INSULIN ASPART 100 UNIT/ML IV SOLN
10.0000 [IU] | Freq: Once | INTRAVENOUS | Status: AC
  Administered 2020-01-07: 10 [IU] via INTRAVENOUS

## 2020-01-07 MED ORDER — SODIUM ZIRCONIUM CYCLOSILICATE 5 G PO PACK
10.0000 g | PACK | Freq: Once | ORAL | Status: AC
  Administered 2020-01-07: 10 g via ORAL
  Filled 2020-01-07: qty 2

## 2020-01-07 MED ORDER — IOHEXOL 300 MG/ML  SOLN
100.0000 mL | Freq: Once | INTRAMUSCULAR | Status: AC | PRN
  Administered 2020-01-07: 100 mL via INTRAVENOUS

## 2020-01-07 MED ORDER — DEXTROSE 50 % IV SOLN
1.0000 | Freq: Once | INTRAVENOUS | Status: AC
  Administered 2020-01-07: 50 mL via INTRAVENOUS
  Filled 2020-01-07: qty 50

## 2020-01-07 MED ORDER — SODIUM BICARBONATE 8.4 % IV SOLN: 50.0000 meq | Freq: Once | INTRAVENOUS | Status: AC

## 2020-01-07 MED ORDER — CALCIUM GLUCONATE-NACL 1-0.675 GM/50ML-% IV SOLN
INTRAVENOUS | Status: AC
  Filled 2020-01-07: qty 50

## 2020-01-07 NOTE — ED Triage Notes (Signed)
Pt with abd pain for a week.  Denies N/V/D

## 2020-01-07 NOTE — ED Notes (Signed)
Date and time results received: 01/07/20  (use smartphrase ".now" to insert current time)  Test: Potassium Critical Value: Greater than 7.5  Name of Provider Notified: Dr. Rogene Houston  Orders Received? Or Actions Taken?:N/A

## 2020-01-07 NOTE — ED Notes (Signed)
Date and time results received: 01/07/20 2126 (use smartphrase ".now" to insert current time)  Test:Potassium Critical Value: Greater than 7.5  Name of Provider Notified: Dr Rogene Houston Orders Received? Or Actions Taken?: NA

## 2020-01-07 NOTE — ED Notes (Signed)
Pt. Refused medications and requested to go home. Advised pt. About risk for heart attack with potassium levels being greater than 7.5. Pt. Still requested to go home. Notified EDP. EDP in room with pt.

## 2020-01-07 NOTE — ED Notes (Signed)
Dr. Rogene Houston at bedside discussing benefits of admission vs. The risk of leaving AMA.

## 2020-01-07 NOTE — ED Notes (Signed)
Pt. With CT. 

## 2020-01-07 NOTE — ED Notes (Signed)
Pt. States they will stay but refused to be Covid swabbed.

## 2020-01-08 ENCOUNTER — Encounter (HOSPITAL_COMMUNITY): Payer: Self-pay | Admitting: Internal Medicine

## 2020-01-08 DIAGNOSIS — E875 Hyperkalemia: Secondary | ICD-10-CM

## 2020-01-08 DIAGNOSIS — R1900 Intra-abdominal and pelvic swelling, mass and lump, unspecified site: Secondary | ICD-10-CM | POA: Diagnosis present

## 2020-01-08 LAB — CBC
HCT: 34.3 % — ABNORMAL LOW (ref 36.0–46.0)
Hemoglobin: 10.7 g/dL — ABNORMAL LOW (ref 12.0–15.0)
MCH: 30.4 pg (ref 26.0–34.0)
MCHC: 31.2 g/dL (ref 30.0–36.0)
MCV: 97.4 fL (ref 80.0–100.0)
Platelets: 391 10*3/uL (ref 150–400)
RBC: 3.52 MIL/uL — ABNORMAL LOW (ref 3.87–5.11)
RDW: 12.8 % (ref 11.5–15.5)
WBC: 13.6 10*3/uL — ABNORMAL HIGH (ref 4.0–10.5)
nRBC: 0 % (ref 0.0–0.2)

## 2020-01-08 LAB — COMPREHENSIVE METABOLIC PANEL
ALT: 10 U/L (ref 0–44)
AST: 10 U/L — ABNORMAL LOW (ref 15–41)
Albumin: 3.2 g/dL — ABNORMAL LOW (ref 3.5–5.0)
Alkaline Phosphatase: 119 U/L (ref 38–126)
Anion gap: 8 (ref 5–15)
BUN: 18 mg/dL (ref 8–23)
CO2: 19 mmol/L — ABNORMAL LOW (ref 22–32)
Calcium: 9.6 mg/dL (ref 8.9–10.3)
Chloride: 109 mmol/L (ref 98–111)
Creatinine, Ser: 0.91 mg/dL (ref 0.44–1.00)
GFR calc Af Amer: >60 mL/min (ref >60)
GFR calc non Af Amer: >60 mL/min (ref >60)
Glucose, Bld: 77 mg/dL (ref 70–99)
Potassium: 6.2 mmol/L — ABNORMAL HIGH (ref 3.5–5.1)
Sodium: 136 mmol/L (ref 135–145)
Total Bilirubin: 0.4 mg/dL (ref 0.3–1.2)
Total Protein: 6.6 g/dL (ref 6.5–8.1)

## 2020-01-08 LAB — RESPIRATORY PANEL BY RT PCR (FLU A&B, COVID)
Influenza A by PCR: NEGATIVE
Influenza B by PCR: NEGATIVE
SARS Coronavirus 2 by RT PCR: NEGATIVE

## 2020-01-08 LAB — OSMOLALITY, URINE: Osmolality, Ur: 384 mOsm/kg (ref 300–900)

## 2020-01-08 LAB — POTASSIUM
Potassium: 6.2 mmol/L — ABNORMAL HIGH (ref 3.5–5.1)
Potassium: 5.7 mmol/L — ABNORMAL HIGH (ref 3.5–5.1)
Potassium: 5.2 mmol/L — ABNORMAL HIGH (ref 3.5–5.1)

## 2020-01-08 LAB — NA AND K (SODIUM & POTASSIUM), RAND UR
Potassium Urine: 33 mmol/L
Sodium, Ur: 120 mmol/L

## 2020-01-08 MED ORDER — ACETAMINOPHEN 650 MG RE SUPP: 650.0000 mg | Freq: Four times a day (QID) | RECTAL | Status: DC | PRN

## 2020-01-08 MED ORDER — SODIUM CHLORIDE 0.9 % IV BOLUS
500.0000 mL | Freq: Once | INTRAVENOUS | Status: AC
  Administered 2020-01-08: 500 mL via INTRAVENOUS

## 2020-01-08 MED ORDER — ONDANSETRON HCL 4 MG PO TABS: 4.0000 mg | ORAL_TABLET | Freq: Four times a day (QID) | ORAL | Status: DC | PRN

## 2020-01-08 MED ORDER — POLYETHYLENE GLYCOL 3350 17 G PO PACK
17.0000 g | PACK | Freq: Once | ORAL | Status: AC
  Administered 2020-01-08: 17 g via ORAL
  Filled 2020-01-08: qty 1

## 2020-01-08 MED ORDER — SENNA 8.6 MG PO TABS
2.0000 | ORAL_TABLET | Freq: Once | ORAL | Status: AC
  Administered 2020-01-08: 17.2 mg via ORAL
  Filled 2020-01-08: qty 2

## 2020-01-08 MED ORDER — FUROSEMIDE 10 MG/ML IJ SOLN
40.0000 mg | Freq: Once | INTRAMUSCULAR | Status: AC
  Administered 2020-01-08: 40 mg via INTRAVENOUS
  Filled 2020-01-08: qty 4

## 2020-01-08 MED ORDER — ACETAMINOPHEN 325 MG PO TABS: 650.0000 mg | ORAL_TABLET | Freq: Four times a day (QID) | ORAL | Status: DC | PRN

## 2020-01-08 MED ORDER — ONDANSETRON HCL 4 MG/2ML IJ SOLN: 4.0000 mg | Freq: Four times a day (QID) | INTRAMUSCULAR | Status: DC | PRN

## 2020-01-08 MED ORDER — ENOXAPARIN SODIUM 40 MG/0.4ML ~~LOC~~ SOLN
40.0000 mg | Freq: Every day | SUBCUTANEOUS | Status: DC
  Administered 2020-01-08: 40 mg via SUBCUTANEOUS
  Filled 2020-01-08: qty 0.4

## 2020-01-08 MED ORDER — SODIUM ZIRCONIUM CYCLOSILICATE 10 G PO PACK
10.0000 g | PACK | Freq: Once | ORAL | Status: AC
  Administered 2020-01-08: 10 g via ORAL
  Filled 2020-01-08: qty 1

## 2020-01-08 NOTE — ED Provider Notes (Signed)
[facility] EMERGENCY DEPARTMENT Provider Note   CSN: [id number] Arrival date & time: 01/07/20  1755     History Chief Complaint  Patient presents with  . Abdominal Pain    Lisa Jenkins is a 69 y.o. female.  Patient presenting with a complaint of bilateral lower quadrant abdominal pain is been ongoing for a week.  No nausea vomiting or diarrhea.  Patient thinks that she may be constipated.  No fevers no upper respiratory symptoms.  No blood in her bowel movements.  Past medical history is significant for diabetes high cholesterol hypertension coronary artery disease with stents following a non-STEMI in 2018.  Patient denies any potassium supplementation.  In the past she was on p.o. potassium but has not been on that for many months.        Past Medical History:  Diagnosis Date  . Coronary artery disease    a. s/p NSTEMI in 07/2017 with DES to mid-RCA and residual disease along proximal-mid LAD  . Diabetes mellitus without complication ([place])   . High cholesterol   . Hypertension   . Myocardial infarction [facility])     Patient Active Problem List   Diagnosis Date Noted  . NSTEMI (non-ST elevated myocardial infarction) ([place]) 07/08/2017  . Community acquired pneumonia of right upper lobe of lung   . Chest pain 07/28/2016  . 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 Hypothyroidism 10/21/2015  . Uncontrolled type 2 diabetes mellitus with complication ([place]) [id number]  . Type 2 diabetes mellitus with complication ([place]) [id number]  . Hyperlipidemia 07/21/2015  . Essential hypertension, benign 07/21/2015  . Cigarette nicotine dependence, uncomplicated [id number]  . Type 2 diabetes mellitus with retinopathy ([place]) 07/21/2015  . Proteinuria 07/21/2015  . Thyroid activity decreased 07/21/2015    Past Surgical History:  Procedure Laterality Date  . BUNIONECTOMY Bilateral   . CORONARY STENT INTERVENTION N/A 07/09/2017   Procedure: CORONARY STENT INTERVENTION;  Surgeon: Belva Crome,  MD;  Location: [place] [facility];  Service: Cardiovascular;  Laterality: N/A;  . LEFT HEART CATH AND CORONARY ANGIOGRAPHY N/A 07/09/2017   Procedure: LEFT HEART CATH AND CORONARY ANGIOGRAPHY;  Surgeon: Belva Crome, MD;  Location: [place] [facility];  Service: Cardiovascular;  Laterality: N/A;     OB History    Gravida      Para      Term      Preterm      AB      Living  0     SAB      TAB      Ectopic      Multiple      Live Births              Family History  Problem Relation Age of Onset  . Diabetes Mother   . Heart disease Mother   . Heart disease Father   . Diabetes Sister   . Heart disease Sister   . Diabetes Brother   . Heart disease Brother   . Diabetes Brother   . Heart disease Brother   . Diabetes Brother   . Heart disease Brother   . Diabetes Brother   . Heart disease Brother   . Diabetes Brother   . Heart disease Brother     Social History   Tobacco Use  . Smoking status: Current Every Day Smoker    Packs/day: 0.25    Years: 46.00    Pack years: 11.50  . Smokeless tobacco: Never Used  Substance  Use Topics  . Alcohol use: No    Alcohol/week: 0.0 standard drinks  . Drug use: No    Home Medications Prior to Admission medications   Medication Sig Start Date End Date Taking? Authorizing Provider  aspirin 81 MG chewable tablet Chew 81 mg by mouth daily.    Yes [provider]  atorvastatin (LIPITOR) 80 MG tablet TAKE 1 Tablet BY MOUTH ONCE DAILY AT 6:00PM Patient taking differently: Take 80 mg by mouth every evening. TAKE 1 Tablet BY MOUTH ONCE DAILY AT 6:00PM 02/05/18  Yes Soyla Dryer, PA-C  chlorthalidone (HYGROTON) 25 MG tablet Take 1 tablet (25 mg total) by mouth daily. 03/27/19 01/07/20 Yes Strader, Fransisco Hertz, PA-C  diphenhydrAMINE (BENADRYL) 25 MG tablet Take 1 tablet (25 mg total) by mouth every 6 (six) hours as needed for itching. 05/14/19  Yes Triplett, Tammy, PA-C  lisinopril (ZESTRIL) 20 MG tablet Take 20 mg by  mouth daily.   Yes [provider]  metFORMIN (GLUCOPHAGE) 1000 MG tablet TAKE 1 Tablet  BY MOUTH TWICE DAILY WITH MEALS Patient taking differently: Take 1,000 mg by mouth 2 (two) times daily with a meal.  04/28/18  Yes Soyla Dryer, PA-C  metoprolol tartrate (LOPRESSOR) 25 MG tablet TAKE 1 Tablet  BY MOUTH TWICE DAILY 04/28/18  Yes Soyla Dryer, PA-C  OZEMPIC, 1 MG/DOSE, 2 MG/1.5ML SOPN Inject 1 mg into the skin every Thursday. 12/18/19  Yes [provider]  potassium chloride SA (KLOR-CON) 20 MEQ tablet Take 1 tablet (20 mEq total) by mouth daily. 07/03/19  Yes Strader, [place] M, PA-C  SYNTHROID 25 MCG tablet TAKE 1 Tablet BY MOUTH ONCE DAILY BEFORE BREAKFAST Patient taking differently: Take 25 mcg by mouth daily before breakfast.  01/04/18  Yes McElroy, Larene Beach, PA-C  TRESIBA FLEXTOUCH 100 UNIT/ML SOPN FlexTouch Pen Inject 20 Units into the skin every morning.  02/26/19  Yes [provider]  Vitamin D, Ergocalciferol, (DRISDOL) 1.25 MG (50000 UNIT) CAPS capsule Take 50,000 Units by mouth once a week. 12/18/19  Yes [provider]    Allergies    Patient has no known allergies.  Review of Systems   Review of Systems  Constitutional: Negative for chills and fever.  HENT: Negative for congestion, rhinorrhea and sore throat.   Eyes: Negative for visual disturbance.  Respiratory: Negative for cough and shortness of breath.   Cardiovascular: Negative for chest pain and leg swelling.  Gastrointestinal: Positive for abdominal distention and abdominal pain. Negative for diarrhea, nausea and vomiting.  Genitourinary: Negative for dysuria.  Musculoskeletal: Negative for back pain and neck pain.  Skin: Negative for rash.  Neurological: Negative for dizziness, light-headedness and headaches.  Hematological: Does not bruise/bleed easily.  Psychiatric/Behavioral: Negative for confusion.    Physical Exam Updated Vital Signs BP (!) 156/70   Pulse (!) 114    Temp 98.1 F (36.7 C) (Oral)   Resp 16   Ht 1.6 m (5\' 3" )   Wt 65.3 kg   SpO2 100%   BMI 25.51 kg/m   Physical Exam Vitals and nursing note reviewed.  Constitutional:      General: She is not in acute distress.    Appearance: Normal appearance. She is well-developed. She is not ill-appearing.  HENT:     Head: Normocephalic and atraumatic.  Eyes:     Extraocular Movements: Extraocular movements intact.     Conjunctiva/sclera: Conjunctivae normal.     Pupils: Pupils are equal, round, and reactive to light.  Cardiovascular:  Rate and Rhythm: Normal rate and regular rhythm.     Heart sounds: No murmur.  Pulmonary:     Effort: Pulmonary effort is normal. No respiratory distress.     Breath sounds: Normal breath sounds.  Abdominal:     General: There is distension.     Palpations: Abdomen is soft.     Tenderness: There is no abdominal tenderness.  Musculoskeletal:        General: No swelling. Normal range of motion.     Cervical back: Normal range of motion and neck supple.  Skin:    General: Skin is warm and dry.     Capillary Refill: Capillary refill takes less than 2 seconds.  Neurological:     General: No focal deficit present.     Mental Status: She is alert and oriented to person, place, and time.     Cranial Nerves: No cranial nerve deficit.     Sensory: No sensory deficit.     Motor: No weakness.     ED Results / Procedures / Treatments   Labs (all labs ordered are listed, but only abnormal results are displayed) Labs Reviewed  CBC WITH DIFFERENTIAL/PLATELET - Abnormal; Notable for the following components:      Result Value   WBC 14.2 (*)    RBC 3.63 (*)    Hemoglobin 11.0 (*)    HCT 35.4 (*)    Platelets 419 (*)    Neutro Abs 10.6 (*)    Monocytes Absolute 1.1 (*)    All other components within normal limits  COMPREHENSIVE METABOLIC PANEL - Abnormal; Notable for the following components:   Sodium 134 (*)    Potassium >7.5 (*)    Chloride 112 (*)      CO2 16 (*)    Glucose, Bld 110 (*)    Creatinine, Ser 1.02 (*)    Albumin 3.3 (*)    AST 10 (*)    GFR calc non Af Amer 57 (*)    All other components within normal limits  POTASSIUM - Abnormal; Notable for the following components:   Potassium >7.5 (*)    All other components within normal limits  MAGNESIUM - Abnormal; Notable for the following components:   Magnesium 1.3 (*)    All other components within normal limits  RESPIRATORY PANEL BY RT PCR (FLU A&B, COVID)  LIPASE, BLOOD    EKG None  Radiology CT Abdomen Pelvis W Contrast  Result Date: 01/07/2020 CLINICAL DATA:  Abdominal pain EXAM: CT ABDOMEN AND PELVIS WITH CONTRAST TECHNIQUE: Multidetector CT imaging of the abdomen and pelvis was performed using the standard protocol following bolus administration of intravenous contrast. CONTRAST:  138mL OMNIPAQUE IOHEXOL 300 MG/ML  SOLN COMPARISON:  MRI 07/29/2016, CT 07/28/2016 FINDINGS: Lower chest: Lung bases demonstrate no acute consolidation or effusion. Coronary vascular calcification. Hepatobiliary: No focal liver abnormality is seen. No gallstones, gallbladder wall thickening, or biliary dilatation. Pancreas: Unremarkable. No pancreatic ductal dilatation or surrounding inflammatory changes. Spleen: Normal in size without focal abnormality. Adrenals/Urinary Tract: 11 mm indeterminate right adrenal gland nodule. Grossly stable 2.9 cm left adrenal mass. Kidneys show no hydronephrosis. Small bilateral renal cysts. Indeterminate 12 mm exophytic hypodense lesion lower pole left kidney, series 2, image number 44. Indeterminate partially exophytic 9 mm hypodensity posterior cortex lower pole right kidney, series 2, image number 41. Urinary bladder is unremarkable. Intrarenal vascular calcifications. Probable small stones within both kidneys. Stomach/Bowel: Stomach is nonenlarged. No dilated small bowel. Negative appendix. No: Wall thickening. Vascular/Lymphatic:  Extensive aortic  atherosclerosis. No aneurysm. No suspicious pelvic adenopathy Reproductive: Interval development of large heterogeneous cystic and solid pelvic mass measuring 11 cm AP by 8.6 cm transverse by 8.9 cm craniocaudad. Other: No free air. No significant pelvic ascites Musculoskeletal: No acute or suspicious osseous abnormality IMPRESSION: 1. Interval development of large heterogeneous cystic and solid pelvic mass, uncertain if origin is uterus or ovaries. Mass is indeterminate for gynecologic malignancy given new finding in postmenopausal patient since the previous exam. 2. 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 Indeterminate 11 mm right adrenal gland nodule, this could be evaluated with nonemergent adrenal CT. 3. Small bilateral renal cysts. Indeterminate hypodense lesions in the lower poles of both kidneys. When the patient is clinically stable and able to follow directions and hold their breath (preferably as an outpatient) further evaluation with dedicated abdominal MRI should be considered. Aortic Atherosclerosis (ICD10-I70.0). Electronically Signed   By: Donavan Foil M.D.   On: 01/07/2020 21:27    Procedures Procedures (including critical care time)  CRITICAL CARE Performed by: Fredia Sorrow Total critical care time: 45 minutes Critical care time was exclusive of separately billable procedures and treating other patients. Critical care was necessary to treat or prevent imminent or life-threatening deterioration. Critical care was time spent personally by me on the following activities: development of treatment plan with patient and/or surrogate as well as nursing, discussions with consultants, evaluation of patient's response to treatment, examination of patient, obtaining history from patient or surrogate, ordering and performing treatments and interventions, ordering and review of laboratory studies, ordering and review of radiographic studies, pulse oximetry and re-evaluation of patient's  condition.   Medications Ordered in ED Medications  calcium gluconate 1 g in sodium chloride 0.9 % 100 mL IVPB (0 g Intravenous Stopped 01/07/20 2259)  iohexol (OMNIPAQUE) 300 MG/ML solution 100 mL (100 mLs Intravenous Contrast Given 01/07/20 2058)  insulin aspart (novoLOG) injection 10 Units (10 Units Intravenous Given 01/07/20 2245)    And  dextrose 50 % solution 50 mL (50 mLs Intravenous Given 01/07/20 2232)  sodium bicarbonate injection 50 mEq (50 mEq Intravenous Given 01/07/20 2248)  sodium zirconium cyclosilicate (LOKELMA) packet 10 g (10 g Oral Given 01/07/20 2339)    ED Course  I have reviewed the triage vital signs and the nursing notes.  Pertinent labs & imaging results that were available during my care of the patient were reviewed by me and considered in my medical decision making (see chart for details).    MDM Rules/Calculators/A&P                      Work-up for the abdominal pain shows evidence of a pelvic mass.  Which could be neoplastic in nature.  There is an adrenal mass that is stable.  However the unexpected finding was marked hyperkalemia.  Potassium greater than 7.5.  It was repeated and confirmed.  Patient's renal function is normal.  Patient denied any p.o. potassium supplementation or taking any food products that are high in potassium.  However we did find out later that she uses a potassium salt and has been using that heavily.  And that may explain the high potassiums.  EKG did not have any acute hyperkalemic changes.  Patient received calcium gluconate and then received insulin and dextrose and bicarb.  And then on top of that also received [place].   Contacted hospitalist for admission.  Patient has predominantly for the hyperkalemia.  Pelvic mass could be worked up as  an outpatient but could be worked up some while she is in the hospital.  Hospitalist will admit.     Final Clinical Impression(s) / ED Diagnoses Final diagnoses:  Hyperkalemia  Abdominal mass of  other site    Rx / DC Orders ED Discharge Orders    None       Fredia Sorrow, MD 01/08/20 0005

## 2020-01-08 NOTE — Care Management Obs Status (Signed)
[place] NOTIFICATION   Patient Details  Name: Lisa Jenkins MRN: [id number] Date of Birth: February 17, 1951   Medicare Observation Status Notification Given:  Yes    Tommy Medal 01/08/2020, 12:30 PM

## 2020-01-08 NOTE — Progress Notes (Signed)
IV removed and discharge instructions reviewed.  Sister-in-law here and to drive home

## 2020-01-08 NOTE — Discharge Summary (Signed)
Physician Discharge Summary  Lisa Jenkins [id number] DOB: [birth year] DOA: 01/07/2020  PCP: Denyce Robert, FNP  Admit date: 01/07/2020 Discharge date: 01/08/2020  Admitted From: Home  Disposition:  Home   Recommendations for Outpatient Follow-up:  1. Follow up with PCP Denyce Robert in 1 week 2. [name]: Please obtain BMP next week early to repeat K 3. Please follow up with Dr. Elonda Husky Ob-GYN in 2 weeks     Home Health: None  Equipment/Devices: None  Discharge Condition: Good  CODE STATUS: FULL Diet recommendation: Regular  Brief/Interim Summary: Lisa Jenkins is a 69 y.o. F with HTN, DM, CAD who presented with abdominal pain, pressure.    In the ER, she was incidentally found to have K>7 with normal renal function.  It was determined on history that this was due to taking salt substitutes.  In addition she had a CT of the abdomen and pelvis that showed a 10 cm pelvic mass.  She was given calcium gluconate, IV insulin and dextrose, and Lokelma and the hospitalist service were asked to evaluate for hyperkalemia.         PRINCIPAL HOSPITAL DIAGNOSIS: Hyperkalemia    Discharge Diagnoses:   Hyperkalemia The patient was admitted, started on IV fluids, Lokelma, and Lasix.  Lisinopril was held.  Her potassium was trended and returned back to normal.  TTKG suggested hypoaldosteronism.  Possibly due to salt substitutes.  Doubt tumor lysis syndrome from pelvic mass.  Lisinopril stopped.  Recommend close follow up K.    Pelvic mass This was noted incidentally.  Discussed with Dr. Elonda Husky by phone, who recommended outpatient follow up in two weeks.  This was discussed with patient and contact information given.  She understands the improtance of follow up.  Diabetes Continue current regimen  Hypertension Continue metropolol and amlodipine       Discharge Instructions  Discharge Instructions    Diet - low sodium heart healthy   Complete by: As directed     Discharge instructions   Complete by: As directed    From Dr. Loleta Books: You were admitted for high potassium This was corrected with medical treatments.  You should resume your home medicines as above but STOP lisinopril  Call your priamry care doctor for a follow up and labs early next week  Get labs on Monday, get an office visit later in the week  Resume your diabetes medicines  AVOID potassium supplements, salt substitutes or potatoes/oranges, which have a lot of potassium   For the pelvic mass, please call the gynecologist Dr. Elonda Husky Call his office (number listed below in To Do section) and make an appointment for May 17 or 18   Increase activity slowly   Complete by: As directed      Allergies as of 01/08/2020   No Known Allergies     Medication List    STOP taking these medications   lisinopril 20 MG tablet Commonly known as: ZESTRIL     TAKE these medications   aspirin 81 MG chewable tablet Chew 81 mg by mouth daily.   atorvastatin 80 MG tablet Commonly known as: LIPITOR TAKE 1 Tablet BY MOUTH ONCE DAILY AT 6:00PM What changed:   how much to take  how to take this  when to take this   chlorthalidone 25 MG tablet Commonly known as: HYGROTON Take 1 tablet (25 mg total) by mouth daily.   diphenhydrAMINE 25 MG tablet Commonly known as: BENADRYL Take 1 tablet (25 mg total) by mouth every 6 (  six) hours as needed for itching.   metFORMIN 1000 MG tablet Commonly known as: GLUCOPHAGE TAKE 1 Tablet  BY MOUTH TWICE DAILY WITH MEALS   metoprolol tartrate 25 MG tablet Commonly known as: LOPRESSOR TAKE 1 Tablet  BY MOUTH TWICE DAILY   Ozempic (1 MG/DOSE) 2 MG/1.5ML Sopn Generic drug: Semaglutide (1 MG/DOSE) Inject 1 mg into the skin every Thursday.   potassium chloride SA 20 MEQ tablet Commonly known as: KLOR-CON Take 1 tablet (20 mEq total) by mouth daily.   Synthroid 25 MCG tablet Generic drug: levothyroxine TAKE 1 Tablet BY MOUTH ONCE DAILY BEFORE  BREAKFAST What changed: See the new instructions.   Tyler Aas FlexTouch 100 UNIT/ML FlexTouch Pen Generic drug: insulin degludec Inject 20 Units into the skin every morning.   Vitamin D (Ergocalciferol) 1.25 MG (50000 UNIT) Caps capsule Commonly known as: DRISDOL Take 50,000 Units by mouth once a week.      Follow-up Information    Denyce Robert, FNP. Schedule an appointment as soon as possible for a visit in 1 week(s).   Specialty: Family Medicine Contact information: [place] [address] [phone]        Florian Buff, MD. Schedule an appointment as soon as possible for a visit in 1 week(s).   Specialties: Obstetrics and Gynecology, Radiology Why: Call Dr. Elonda Husky, Gynecologist, for a follow up appointment on May 17 or 18 about the pelvic mass. Contact information: [place] [address] [phone]          No Known Allergies  Consultations:     Procedures/Studies: CT Abdomen Pelvis W Contrast  Result Date: 01/07/2020 CLINICAL DATA:  Abdominal pain EXAM: CT ABDOMEN AND PELVIS WITH CONTRAST TECHNIQUE: Multidetector CT imaging of the abdomen and pelvis was performed using the standard protocol following bolus administration of intravenous contrast. CONTRAST:  157mL OMNIPAQUE IOHEXOL 300 MG/ML  SOLN COMPARISON:  MRI 07/29/2016, CT 07/28/2016 FINDINGS: Lower chest: Lung bases demonstrate no acute consolidation or effusion. Coronary vascular calcification. Hepatobiliary: No focal liver abnormality is seen. No gallstones, gallbladder wall thickening, or biliary dilatation. Pancreas: Unremarkable. No pancreatic ductal dilatation or surrounding inflammatory changes. Spleen: Normal in size without focal abnormality. Adrenals/Urinary Tract: 11 mm indeterminate right adrenal gland nodule. Grossly stable 2.9 cm left adrenal mass. Kidneys show no hydronephrosis. Small bilateral renal cysts. Indeterminate 12 mm exophytic hypodense  lesion lower pole left kidney, series 2, image number 44. Indeterminate partially exophytic 9 mm hypodensity posterior cortex lower pole right kidney, series 2, image number 41. Urinary bladder is unremarkable. Intrarenal vascular calcifications. Probable small stones within both kidneys. Stomach/Bowel: Stomach is nonenlarged. No dilated small bowel. Negative appendix. No: Wall thickening. Vascular/Lymphatic: Extensive aortic atherosclerosis. No aneurysm. No suspicious pelvic adenopathy Reproductive: Interval development of large heterogeneous cystic and solid pelvic mass measuring 11 cm AP by 8.6 cm transverse by 8.9 cm craniocaudad. Other: No free air. No significant pelvic ascites Musculoskeletal: No acute or suspicious osseous abnormality IMPRESSION: 1. Interval development of large heterogeneous cystic and solid pelvic mass, uncertain if origin is uterus or ovaries. Mass is indeterminate for gynecologic malignancy given new finding in postmenopausal patient since the previous exam. 2. 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 Indeterminate 11 mm right adrenal gland nodule, this could be evaluated with nonemergent adrenal CT. 3. Small bilateral renal cysts. Indeterminate hypodense lesions in the lower poles of both kidneys. When the patient is clinically stable and able to follow directions and hold their breath (  preferably as an outpatient) further evaluation with dedicated abdominal MRI should be considered. Aortic Atherosclerosis (ICD10-I70.0). Electronically Signed   By: Donavan Foil M.D.   On: 01/07/2020 21:27       Subjective: Feeling well.  NO chest pain, no confusion.  Mild abdominal fullness.  Discharge Exam: Vitals:   01/08/20 0313 01/08/20 0639  BP: (!) 159/74 (!) 163/78  Pulse: 83 75  Resp: 20 16  Temp: 98.7 F (37.1 C) 98.8 F (37.1 C)  SpO2: 98% 96%   Vitals:   01/07/20 2304 01/07/20 2330 01/08/20 0313 01/08/20 0639  BP: (!) 156/70 (!) 167/79 (!) 159/74 (!) 163/78   Pulse: (!) 114 74 83 75  Resp: 16 20 20 16   Temp:   98.7 F (37.1 C) 98.8 F (37.1 C)  TempSrc:   Oral Oral  SpO2: 100% 98% 98% 96%  Weight:   62.3 kg   Height:   5\' 3"  (1.6 m)     General: Pt is alert, awake, not in acute distress Cardiovascular: RRR, nl S1-S2, no murmurs appreciated.   No LE edema.   Respiratory: Normal respiratory rate and rhythm.  CTAB without rales or wheezes. Abdominal: Abdomen soft and non-tender.  No distension or HSM.   Neuro/Psych: Strength symmetric in upper and lower extremities.  Judgment and insight appear normal.   The results of significant diagnostics from this hospitalization (including imaging, microbiology, ancillary and laboratory) are listed below for reference.     Microbiology: Recent Results (from the past 240 hour(s))  Respiratory Panel by RT PCR (Flu A&B, Covid) - Nasopharyngeal Swab     Status: None   Collection Time: 01/07/20 11:43 PM   Specimen: Nasopharyngeal Swab  Result Value Ref Range Status   SARS Coronavirus 2 by RT PCR NEGATIVE NEGATIVE Final    Comment: (NOTE) SARS-CoV-2 target nucleic acids are NOT DETECTED. The SARS-CoV-2 RNA is generally detectable in upper respiratoy specimens during the acute phase of infection. The lowest concentration of SARS-CoV-2 viral copies this assay can detect is 131 copies/mL. A negative result does not preclude SARS-Cov-2 infection and should not be used as the sole basis for treatment or other patient management decisions. A negative result may occur with  improper specimen collection/handling, submission of specimen other than nasopharyngeal swab, presence of viral mutation(s) within the areas targeted by this assay, and inadequate number of viral copies (<131 copies/mL). A negative result must be combined with clinical observations, patient history, and epidemiological information. The expected result is Negative. Fact Sheet for Patients:   PinkCheek.be Fact Sheet for Healthcare Providers:  GravelBags.it This test is not yet ap proved or cleared by the [place] FDA and  has been authorized for detection and/or diagnosis of SARS-CoV-2 by FDA under an Emergency Use Authorization (EUA). This EUA will remain  in effect (meaning this test can be used) for the duration of the COVID-19 declaration under Section 564(b)(1) of the Act, 21 U.S.C. section 360bbb-3(b)(1), unless the authorization is terminated or revoked sooner.    Influenza A by PCR NEGATIVE NEGATIVE Final   Influenza B by PCR NEGATIVE NEGATIVE Final    Comment: (NOTE) The Xpert Xpress SARS-CoV-2/FLU/RSV assay is intended as an aid in  the diagnosis of influenza from Nasopharyngeal swab specimens and  should not be used as a sole basis for treatment. Nasal washings and  aspirates are unacceptable for Xpert Xpress SARS-CoV-2/FLU/RSV  testing. Fact Sheet for Patients: PinkCheek.be Fact Sheet for Healthcare Providers: GravelBags.it This test is not yet approved  or cleared by the [place] and  has been authorized for detection and/or diagnosis of SARS-CoV-2 by  FDA under an Emergency Use Authorization (EUA). This EUA will remain  in effect (meaning this test can be used) for the duration of the  Covid-19 declaration under Section 564(b)(1) of the Act, 21  U.S.C. section 360bbb-3(b)(1), unless the authorization is  terminated or revoked. Performed at [facility], [address] [place], [place] [address]      Labs: BNP (last 3 results) No results for input(s): BNP in the last 8760 hours. Basic Metabolic Panel: Recent Labs  Lab 01/07/20 1916 01/07/20 2037 01/08/20 0222 01/08/20 1225 01/08/20 1629  NA 134*  --  136  --   --   K >7.5* >7.5* 6.2*  6.2* 5.7* 5.2*  CL 112*  --  109  --   --   CO2 16*  --  19*  --   --   GLUCOSE  110*  --  77  --   --   BUN 20  --  18  --   --   CREATININE 1.02*  --  0.91  --   --   CALCIUM 9.3  --  9.6  --   --   MG 1.3*  --   --   --   --    Liver Function Tests: Recent Labs  Lab 01/07/20 1916 01/08/20 0222  AST 10* 10*  ALT 9 10  ALKPHOS 124 119  BILITOT 0.4 0.4  PROT 7.0 6.6  ALBUMIN 3.3* 3.2*   Recent Labs  Lab 01/07/20 1916  LIPASE 40   No results for input(s): AMMONIA in the last 168 hours. CBC: Recent Labs  Lab 01/07/20 1916 01/08/20 0222  WBC 14.2* 13.6*  NEUTROABS 10.6*  --   HGB 11.0* 10.7*  HCT 35.4* 34.3*  MCV 97.5 97.4  PLT 419* 391   Cardiac Enzymes: No results for input(s): CKTOTAL, CKMB, CKMBINDEX, TROPONINI in the last 168 hours. BNP: Invalid input(s): POCBNP CBG: No results for input(s): GLUCAP in the last 168 hours. D-Dimer No results for input(s): DDIMER in the last 72 hours. Hgb A1c No results for input(s): HGBA1C in the last 72 hours. Lipid Profile No results for input(s): CHOL, HDL, LDLCALC, TRIG, CHOLHDL, LDLDIRECT in the last 72 hours. Thyroid function studies No results for input(s): TSH, T4TOTAL, T3FREE, THYROIDAB in the last 72 hours.  Invalid input(s): FREET3 Anemia work up No results for input(s): VITAMINB12, FOLATE, FERRITIN, TIBC, IRON, RETICCTPCT in the last 72 hours. Urinalysis    Component Value Date/Time   COLORURINE YELLOW 07/28/2016 1703   APPEARANCEUR CLEAR 07/28/2016 1703   LABSPEC 1.010 07/28/2016 1703   PHURINE 6.0 07/28/2016 1703   GLUCOSEU >1000 (A) 07/28/2016 1703   HGBUR TRACE (A) 07/28/2016 1703   BILIRUBINUR NEGATIVE 07/28/2016 1703   KETONESUR NEGATIVE 07/28/2016 1703   PROTEINUR 100 (A) 07/28/2016 1703   NITRITE NEGATIVE 07/28/2016 1703   LEUKOCYTESUR NEGATIVE 07/28/2016 1703   Sepsis Labs Invalid input(s): PROCALCITONIN,  WBC,  LACTICIDVEN Microbiology Recent Results (from the past 240 hour(s))  Respiratory Panel by RT PCR (Flu A&B, Covid) - Nasopharyngeal Swab     Status: None    Collection Time: 01/07/20 11:43 PM   Specimen: Nasopharyngeal Swab  Result Value Ref Range Status   SARS Coronavirus 2 by RT PCR NEGATIVE NEGATIVE Final    Comment: (NOTE) SARS-CoV-2 target nucleic acids are NOT DETECTED. The SARS-CoV-2 RNA is generally detectable in upper respiratoy  specimens during the acute phase of infection. The lowest concentration of SARS-CoV-2 viral copies this assay can detect is 131 copies/mL. A negative result does not preclude SARS-Cov-2 infection and should not be used as the sole basis for treatment or other patient management decisions. A negative result may occur with  improper specimen collection/handling, submission of specimen other than nasopharyngeal swab, presence of viral mutation(s) within the areas targeted by this assay, and inadequate number of viral copies (<131 copies/mL). A negative result must be combined with clinical observations, patient history, and epidemiological information. The expected result is Negative. Fact Sheet for Patients:  PinkCheek.be Fact Sheet for Healthcare Providers:  GravelBags.it This test is not yet ap proved or cleared by the [place] FDA and  has been authorized for detection and/or diagnosis of SARS-CoV-2 by FDA under an Emergency Use Authorization (EUA). This EUA will remain  in effect (meaning this test can be used) for the duration of the COVID-19 declaration under Section 564(b)(1) of the Act, 21 U.S.C. section 360bbb-3(b)(1), unless the authorization is terminated or revoked sooner.    Influenza A by PCR NEGATIVE NEGATIVE Final   Influenza B by PCR NEGATIVE NEGATIVE Final    Comment: (NOTE) The Xpert Xpress SARS-CoV-2/FLU/RSV assay is intended as an aid in  the diagnosis of influenza from Nasopharyngeal swab specimens and  should not be used as a sole basis for treatment. Nasal washings and  aspirates are unacceptable for Xpert Xpress  SARS-CoV-2/FLU/RSV  testing. Fact Sheet for Patients: PinkCheek.be Fact Sheet for Healthcare Providers: GravelBags.it This test is not yet approved or cleared by the [place] FDA and  has been authorized for detection and/or diagnosis of SARS-CoV-2 by  FDA under an Emergency Use Authorization (EUA). This EUA will remain  in effect (meaning this test can be used) for the duration of the  Covid-19 declaration under Section 564(b)(1) of the Act, 21  U.S.C. section 360bbb-3(b)(1), unless the authorization is  terminated or revoked. Performed at [facility], [address], [place], [place] [address]      Time coordinating discharge: 25 minutes      SIGNED:   Edwin Dada, MD  [facility] 01/08/2020, 5:32 PM

## 2020-01-08 NOTE — H&P (Signed)
History and Physical    Lisa Jenkins [id number] DOB: 10/29/1950 DOA: 01/07/2020  PCP: Denyce Robert, FNP (Confirm with patient/family/NH records and if not entered, this has to be entered at [facility] point of entry) Patient coming from: Home  I have personally briefly reviewed patient's old medical records in [place]  Chief Complaint: Abdominal pain  HPI: Lisa Jenkins is a 69 y.o. female with medical history significant of hypertension, hyperlipidemia, diabetes mellitus without complications coronary artery disease s/p stent placement presented to ED for evaluation of abdominal pain.  Patient states that she started having lower abdominal pain 1 week ago and the condition continue to worsen.  Patient states that pain is pressure-like sensation located in lower quadrants, 7 out of 10 on pain scale with no aggravating and alleviating factors.  Patient denies nausea, vomiting, diarrhea, constipation and blood in her stools.  Patient also denies fever, chills, chest pain, shortness of breath and urinary symptoms.  Patient reports that she is not using regular salt in her diet but uses salt supplement for the last few months.  ED Course: On arrival to the ED patient had temperature of 98.1, blood pressure 160/65, heart rate 74, respiratory rate 14 and oxygen saturation 99% on room air.  Blood work showed WBC of 14.2, hemoglobin 11, sodium 134, potassium 7.5, BUN 20, creatinine 1.02, blood glucose 110 and magnesium 1.3.  CT abdomen/pelvis showed heterogeneous cystic and solid pelvic mass but it is not clear whether its originated from uterus or ovaries.  EKG did not showed any abnormal changes in the setting of hyperkalemia.  Patient was managed with dose of calcium gluconate, IV insulin, dextrose water and Lokelma in the ED for hyperkalemia.  Review of Systems: As per HPI otherwise 10 point review of systems negative.    Past Medical History:  Diagnosis Date  . Coronary artery disease    a.  s/p NSTEMI in 07/2017 with DES to mid-RCA and residual disease along proximal-mid LAD  . Diabetes mellitus without complication ([place])   . High cholesterol   . Hypertension   . Myocardial infarction [facility])     Past Surgical History:  Procedure Laterality Date  . BUNIONECTOMY Bilateral   . CORONARY STENT INTERVENTION N/A 07/09/2017   Procedure: CORONARY STENT INTERVENTION;  Surgeon: Belva Crome, MD;  Location: [place] [facility];  Service: Cardiovascular;  Laterality: N/A;  . LEFT HEART CATH AND CORONARY ANGIOGRAPHY N/A 07/09/2017   Procedure: LEFT HEART CATH AND CORONARY ANGIOGRAPHY;  Surgeon: Belva Crome, MD;  Location: [place] [facility];  Service: Cardiovascular;  Laterality: N/A;     reports that she has been smoking. She has a 11.50 pack-year smoking history. She has never used smokeless tobacco. She reports that she does not drink alcohol or use drugs.  No Known Allergies  Family History  Problem Relation Age of Onset  . Diabetes Mother   . Heart disease Mother   . Heart disease Father   . Diabetes Sister   . Heart disease Sister   . Diabetes Brother   . Heart disease Brother   . Diabetes Brother   . Heart disease Brother   . Diabetes Brother   . Heart disease Brother   . Diabetes Brother   . Heart disease Brother   . Diabetes Brother   . Heart disease Brother     Unacceptable: Noncontributory, unremarkable, or negative. Acceptable: (example)Family history negative for heart disease  Prior to Admission medications   Medication  Sig Start Date End Date Taking? Authorizing Provider  aspirin 81 MG chewable tablet Chew 81 mg by mouth daily.    Yes [provider]  atorvastatin (LIPITOR) 80 MG tablet TAKE 1 Tablet BY MOUTH ONCE DAILY AT 6:00PM Patient taking differently: Take 80 mg by mouth every evening. TAKE 1 Tablet BY MOUTH ONCE DAILY AT 6:00PM 02/05/18  Yes Soyla Dryer, PA-C  chlorthalidone (HYGROTON) 25 MG tablet Take 1 tablet (25 mg total) by mouth  daily. 03/27/19 01/07/20 Yes Strader, Fransisco Hertz, PA-C  diphenhydrAMINE (BENADRYL) 25 MG tablet Take 1 tablet (25 mg total) by mouth every 6 (six) hours as needed for itching. 05/14/19  Yes Triplett, Tammy, PA-C  lisinopril (ZESTRIL) 20 MG tablet Take 20 mg by mouth daily.   Yes [provider]  metFORMIN (GLUCOPHAGE) 1000 MG tablet TAKE 1 Tablet  BY MOUTH TWICE DAILY WITH MEALS Patient taking differently: Take 1,000 mg by mouth 2 (two) times daily with a meal.  04/28/18  Yes Soyla Dryer, PA-C  metoprolol tartrate (LOPRESSOR) 25 MG tablet TAKE 1 Tablet  BY MOUTH TWICE DAILY 04/28/18  Yes Soyla Dryer, PA-C  OZEMPIC, 1 MG/DOSE, 2 MG/1.5ML SOPN Inject 1 mg into the skin every Thursday. 12/18/19  Yes [provider]  potassium chloride SA (KLOR-CON) 20 MEQ tablet Take 1 tablet (20 mEq total) by mouth daily. 07/03/19  Yes Strader, [place] M, PA-C  SYNTHROID 25 MCG tablet TAKE 1 Tablet BY MOUTH ONCE DAILY BEFORE BREAKFAST Patient taking differently: Take 25 mcg by mouth daily before breakfast.  01/04/18  Yes McElroy, Larene Beach, PA-C  TRESIBA FLEXTOUCH 100 UNIT/ML SOPN FlexTouch Pen Inject 20 Units into the skin every morning.  02/26/19  Yes [provider]  Vitamin D, Ergocalciferol, (DRISDOL) 1.25 MG (50000 UNIT) CAPS capsule Take 50,000 Units by mouth once a week. 12/18/19  Yes [provider]    Physical Exam: Vitals:   01/07/20 2215 01/07/20 2304 01/07/20 2330 01/08/20 0313  BP: (!) 171/58 (!) 156/70 (!) 167/79 (!) 159/74  Pulse: 74 (!) 114 74 83  Resp: 18 16 20 20   Temp:    98.7 F (37.1 C)  TempSrc:    Oral  SpO2: 96% 100% 98% 98%  Weight:    62.3 kg  Height:    5\' 3"  (1.6 m)    Constitutional: NAD, calm, comfortable Vitals:   01/07/20 2215 01/07/20 2304 01/07/20 2330 01/08/20 0313  BP: (!) 171/58 (!) 156/70 (!) 167/79 (!) 159/74  Pulse: 74 (!) 114 74 83  Resp: 18 16 20 20   Temp:    98.7 F (37.1 C)  TempSrc:    Oral  SpO2: 96% 100% 98% 98%    Weight:    62.3 kg  Height:    5\' 3"  (1.6 m)    General: Patient is a 69 year old Caucasian female in no acute distress. Eyes: PERRL, lids and conjunctivae normal ENMT: Mucous membranes are moist. Posterior pharynx clear of any exudate or lesions.Normal dentition.  Neck: normal, supple, no masses, no thyromegaly Respiratory: clear to auscultation bilaterally, no wheezing, no crackles. Normal respiratory effort. No accessory muscle use.  Cardiovascular: Regular rate and rhythm, no murmurs / rubs / gallops. No extremity edema. 2+ pedal pulses. No carotid bruits.  Abdomen: Mild tenderness in entire lower abdomen, no masses palpated. No hepatosplenomegaly. Bowel sounds positive.  Musculoskeletal: no clubbing / cyanosis. No joint deformity upper and lower extremities. Good ROM, no contractures. Normal muscle tone.  Skin: no rashes, lesions, ulcers. No induration Neurologic:  CN 2-12 grossly intact. Sensation intact, DTR normal. Strength 5/5 in all 4.  Psychiatric: Normal judgment and insight. Alert and oriented x 3. Normal mood.   (Anything < 9 systems with 2 bullets each down codes to level 1) (If patient refuses exam can't bill higher level) (Make sure to document decubitus ulcers present on admission -- if possible -- and whether patient has chronic indwelling catheter at time of admission)  Labs on Admission: I have personally reviewed following labs and imaging studies  CBC: Recent Labs  Lab 01/07/20 1916 01/08/20 0222  WBC 14.2* 13.6*  NEUTROABS 10.6*  --   HGB 11.0* 10.7*  HCT 35.4* 34.3*  MCV 97.5 97.4  PLT 419* 0000000   Basic Metabolic Panel: Recent Labs  Lab 01/07/20 1916 01/07/20 2037 01/08/20 0222  NA 134*  --  136  K >7.5* >7.5* 6.2*  6.2*  CL 112*  --  109  CO2 16*  --  19*  GLUCOSE 110*  --  77  BUN 20  --  18  CREATININE 1.02*  --  0.91  CALCIUM 9.3  --  9.6  MG 1.3*  --   --    GFR: Estimated Creatinine Clearance: 50.3 mL/min (by C-G formula based on SCr  of 0.91 mg/dL). Liver Function Tests: Recent Labs  Lab 01/07/20 1916 01/08/20 0222  AST 10* 10*  ALT 9 10  ALKPHOS 124 119  BILITOT 0.4 0.4  PROT 7.0 6.6  ALBUMIN 3.3* 3.2*   Recent Labs  Lab 01/07/20 1916  LIPASE 40   No results for input(s): AMMONIA in the last 168 hours. Coagulation Profile: No results for input(s): INR, PROTIME in the last 168 hours. Cardiac Enzymes: No results for input(s): CKTOTAL, CKMB, CKMBINDEX, TROPONINI in the last 168 hours. BNP (last 3 results) No results for input(s): PROBNP in the last 8760 hours. HbA1C: No results for input(s): HGBA1C in the last 72 hours. CBG: No results for input(s): GLUCAP in the last 168 hours. Lipid Profile: No results for input(s): CHOL, HDL, LDLCALC, TRIG, CHOLHDL, LDLDIRECT in the last 72 hours. Thyroid Function Tests: No results for input(s): TSH, T4TOTAL, FREET4, T3FREE, THYROIDAB in the last 72 hours. Anemia Panel: No results for input(s): VITAMINB12, FOLATE, FERRITIN, TIBC, IRON, RETICCTPCT in the last 72 hours. Urine analysis:    Component Value Date/Time   COLORURINE YELLOW 07/28/2016 1703   APPEARANCEUR CLEAR 07/28/2016 1703   LABSPEC 1.010 07/28/2016 1703   PHURINE 6.0 07/28/2016 1703   GLUCOSEU >1000 (A) 07/28/2016 1703   HGBUR TRACE (A) 07/28/2016 1703   BILIRUBINUR NEGATIVE 07/28/2016 1703   KETONESUR NEGATIVE 07/28/2016 1703   PROTEINUR 100 (A) 07/28/2016 1703   NITRITE NEGATIVE 07/28/2016 1703   LEUKOCYTESUR NEGATIVE 07/28/2016 1703    Radiological Exams on Admission: CT Abdomen Pelvis W Contrast  Result Date: 01/07/2020 CLINICAL DATA:  Abdominal pain EXAM: CT ABDOMEN AND PELVIS WITH CONTRAST TECHNIQUE: Multidetector CT imaging of the abdomen and pelvis was performed using the standard protocol following bolus administration of intravenous contrast. CONTRAST:  132mL OMNIPAQUE IOHEXOL 300 MG/ML  SOLN COMPARISON:  MRI 07/29/2016, CT 07/28/2016 FINDINGS: Lower chest: Lung bases demonstrate no  acute consolidation or effusion. Coronary vascular calcification. Hepatobiliary: No focal liver abnormality is seen. No gallstones, gallbladder wall thickening, or biliary dilatation. Pancreas: Unremarkable. No pancreatic ductal dilatation or surrounding inflammatory changes. Spleen: Normal in size without focal abnormality. Adrenals/Urinary Tract: 11 mm indeterminate right adrenal gland nodule. Grossly stable 2.9 cm left adrenal mass. Kidneys show no  hydronephrosis. Small bilateral renal cysts. Indeterminate 12 mm exophytic hypodense lesion lower pole left kidney, series 2, image number 44. Indeterminate partially exophytic 9 mm hypodensity posterior cortex lower pole right kidney, series 2, image number 41. Urinary bladder is unremarkable. Intrarenal vascular calcifications. Probable small stones within both kidneys. Stomach/Bowel: Stomach is nonenlarged. No dilated small bowel. Negative appendix. No: Wall thickening. Vascular/Lymphatic: Extensive aortic atherosclerosis. No aneurysm. No suspicious pelvic adenopathy Reproductive: Interval development of large heterogeneous cystic and solid pelvic mass measuring 11 cm AP by 8.6 cm transverse by 8.9 cm craniocaudad. Other: No free air. No significant pelvic ascites Musculoskeletal: No acute or suspicious osseous abnormality IMPRESSION: 1. Interval development of large heterogeneous cystic and solid pelvic mass, uncertain if origin is uterus or ovaries. Mass is indeterminate for gynecologic malignancy given new finding in postmenopausal patient since the previous exam. 2. 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 Indeterminate 11 mm right adrenal gland nodule, this could be evaluated with nonemergent adrenal CT. 3. Small bilateral renal cysts. Indeterminate hypodense lesions in the lower poles of both kidneys. When the patient is clinically stable and able to follow directions and hold their breath (preferably as an outpatient) further evaluation with dedicated  abdominal MRI should be considered. Aortic Atherosclerosis (ICD10-I70.0). Electronically Signed   By: Donavan Foil M.D.   On: 01/07/2020 21:27     Assessment/Plan Principal Problem:   Hyperkalemia Hyperkalemia is most likely secondary to recent frequent use of potassium salts which are usually high in potassium.  Patient had normal renal function. EKG did not show any acute hyperkalemic changes but still patient was given calcium gluconate followed by insulin, D50 and bicarbonate.  Patient is  given a dose of Lokelma.  Patient is advised to stop using salt substitute.  The most recent potassium level is 6.2.  Continue to monitor.  Active Problems:   Pelvic mass Patient has a pelvic mass in the CT scan.  Abdominal pain is improved at this time. Patient need to follow-up with outpatient OB/GYN for pelvic mass.     DVT prophylaxis: Lovenox Code Status: Full code Disposition Plan: Patient will be discharged to home once the potassium level become normal. Consults called:  Admission status: Observation/telemetry   Edmonia Lynch MD [facility] Pager [phone]   If 7PM-7AM, please contact night-coverage [email or web address] Password   01/08/2020, 4:51 AM

## 2020-01-08 NOTE — ED Notes (Addendum)
Pt states she just wants to be left alone so she can sleep  And didn't want cbg

## 2020-01-09 LAB — CA 125: Cancer Antigen (CA) 125: 87.7 U/mL — ABNORMAL HIGH (ref 0.0–38.1)

## 2020-01-10 ENCOUNTER — Encounter (HOSPITAL_COMMUNITY): Payer: Self-pay | Admitting: Emergency Medicine

## 2020-01-10 ENCOUNTER — Inpatient Hospital Stay (HOSPITAL_COMMUNITY)
Admission: EM | Admit: 2020-01-10 | Discharge: 2020-01-12 | DRG: 641 | Disposition: A | Discharge status: Home / Self Care | Payer: Medicare HMO | Attending: Internal Medicine | Admitting: Internal Medicine

## 2020-01-10 ENCOUNTER — Other Ambulatory Visit: Payer: Self-pay

## 2020-01-10 DIAGNOSIS — Z20822 Contact with and (suspected) exposure to covid-19: Secondary | ICD-10-CM | POA: Diagnosis present

## 2020-01-10 DIAGNOSIS — Z01818 Encounter for other preprocedural examination: Secondary | ICD-10-CM

## 2020-01-10 DIAGNOSIS — R1900 Intra-abdominal and pelvic swelling, mass and lump, unspecified site: Secondary | ICD-10-CM | POA: Diagnosis present

## 2020-01-10 DIAGNOSIS — E039 Hypothyroidism, unspecified: Secondary | ICD-10-CM | POA: Diagnosis present

## 2020-01-10 DIAGNOSIS — I10 Essential (primary) hypertension: Secondary | ICD-10-CM | POA: Diagnosis present

## 2020-01-10 DIAGNOSIS — E785 Hyperlipidemia, unspecified: Secondary | ICD-10-CM | POA: Diagnosis present

## 2020-01-10 DIAGNOSIS — Z7982 Long term (current) use of aspirin: Secondary | ICD-10-CM

## 2020-01-10 DIAGNOSIS — Z833 Family history of diabetes mellitus: Secondary | ICD-10-CM

## 2020-01-10 DIAGNOSIS — N179 Acute kidney failure, unspecified: Secondary | ICD-10-CM | POA: Diagnosis present

## 2020-01-10 DIAGNOSIS — E7800 Pure hypercholesterolemia, unspecified: Secondary | ICD-10-CM | POA: Diagnosis present

## 2020-01-10 DIAGNOSIS — Z794 Long term (current) use of insulin: Secondary | ICD-10-CM

## 2020-01-10 DIAGNOSIS — E875 Hyperkalemia: Principal | ICD-10-CM | POA: Diagnosis present

## 2020-01-10 DIAGNOSIS — Z8249 Family history of ischemic heart disease and other diseases of the circulatory system: Secondary | ICD-10-CM

## 2020-01-10 DIAGNOSIS — Z720 Tobacco use: Secondary | ICD-10-CM

## 2020-01-10 DIAGNOSIS — E1169 Type 2 diabetes mellitus with other specified complication: Secondary | ICD-10-CM | POA: Diagnosis present

## 2020-01-10 DIAGNOSIS — D72829 Elevated white blood cell count, unspecified: Secondary | ICD-10-CM | POA: Diagnosis present

## 2020-01-10 DIAGNOSIS — Z79899 Other long term (current) drug therapy: Secondary | ICD-10-CM

## 2020-01-10 DIAGNOSIS — E790 Hyperuricemia without signs of inflammatory arthritis and tophaceous disease: Secondary | ICD-10-CM | POA: Diagnosis present

## 2020-01-10 DIAGNOSIS — F17210 Nicotine dependence, cigarettes, uncomplicated: Secondary | ICD-10-CM | POA: Diagnosis present

## 2020-01-10 DIAGNOSIS — Z955 Presence of coronary angioplasty implant and graft: Secondary | ICD-10-CM

## 2020-01-10 DIAGNOSIS — I444 Left anterior fascicular block: Secondary | ICD-10-CM | POA: Diagnosis present

## 2020-01-10 DIAGNOSIS — E11319 Type 2 diabetes mellitus with unspecified diabetic retinopathy without macular edema: Secondary | ICD-10-CM | POA: Diagnosis present

## 2020-01-10 DIAGNOSIS — I25118 Atherosclerotic heart disease of native coronary artery with other forms of angina pectoris: Secondary | ICD-10-CM

## 2020-01-10 DIAGNOSIS — I252 Old myocardial infarction: Secondary | ICD-10-CM

## 2020-01-10 DIAGNOSIS — R102 Pelvic and perineal pain: Secondary | ICD-10-CM

## 2020-01-10 DIAGNOSIS — I2510 Atherosclerotic heart disease of native coronary artery without angina pectoris: Secondary | ICD-10-CM | POA: Diagnosis present

## 2020-01-10 HISTORY — DX: Hyperkalemia: E87.5

## 2020-01-10 LAB — BASIC METABOLIC PANEL
Anion gap: 12 (ref 5–15)
BUN: 30 mg/dL — ABNORMAL HIGH (ref 8–23)
CO2: 15 mmol/L — ABNORMAL LOW (ref 22–32)
Calcium: 8.6 mg/dL — ABNORMAL LOW (ref 8.9–10.3)
Chloride: 106 mmol/L (ref 98–111)
Creatinine, Ser: 1.5 mg/dL — ABNORMAL HIGH (ref 0.44–1.00)
GFR calc Af Amer: 42 mL/min — ABNORMAL LOW (ref >60)
GFR calc non Af Amer: 36 mL/min — ABNORMAL LOW (ref >60)
Glucose, Bld: 277 mg/dL — ABNORMAL HIGH (ref 70–99)
Potassium: 5.9 mmol/L — ABNORMAL HIGH (ref 3.5–5.1)
Sodium: 133 mmol/L — ABNORMAL LOW (ref 135–145)

## 2020-01-10 LAB — CBC WITH DIFFERENTIAL/PLATELET
Abs Immature Granulocytes: 0.07 10*3/uL (ref 0.00–0.07)
Basophils Absolute: 0.1 10*3/uL (ref 0.0–0.1)
Basophils Relative: 1 %
Eosinophils Absolute: 0.1 10*3/uL (ref 0.0–0.5)
Eosinophils Relative: 1 %
HCT: 34.6 % — ABNORMAL LOW (ref 36.0–46.0)
Hemoglobin: 10.9 g/dL — ABNORMAL LOW (ref 12.0–15.0)
Immature Granulocytes: 1 %
Lymphocytes Relative: 13 %
Lymphs Abs: 1.9 10*3/uL (ref 0.7–4.0)
MCH: 30.9 pg (ref 26.0–34.0)
MCHC: 31.5 g/dL (ref 30.0–36.0)
MCV: 98 fL (ref 80.0–100.0)
Monocytes Absolute: 0.9 10*3/uL (ref 0.1–1.0)
Monocytes Relative: 6 %
Neutro Abs: 11.1 10*3/uL — ABNORMAL HIGH (ref 1.7–7.7)
Neutrophils Relative %: 78 %
Platelets: 408 10*3/uL — ABNORMAL HIGH (ref 150–400)
RBC: 3.53 MIL/uL — ABNORMAL LOW (ref 3.87–5.11)
RDW: 12.7 % (ref 11.5–15.5)
WBC: 14.1 10*3/uL — ABNORMAL HIGH (ref 4.0–10.5)
nRBC: 0 % (ref 0.0–0.2)

## 2020-01-10 LAB — LIPASE, BLOOD: Lipase: 61 U/L — ABNORMAL HIGH (ref 11–51)

## 2020-01-10 LAB — GLUCOSE, CAPILLARY: Glucose-Capillary: 220 mg/dL — ABNORMAL HIGH (ref 70–99)

## 2020-01-10 MED ORDER — SODIUM ZIRCONIUM CYCLOSILICATE 5 G PO PACK
10.0000 g | PACK | Freq: Once | ORAL | Status: AC
  Administered 2020-01-10: 17:00:00 10 g via ORAL
  Filled 2020-01-10: qty 2

## 2020-01-10 MED ORDER — ASPIRIN 81 MG PO CHEW
81.0000 mg | CHEWABLE_TABLET | Freq: Every day | ORAL | Status: DC
  Administered 2020-01-11 – 2020-01-12 (×2): 81 mg via ORAL
  Filled 2020-01-10 (×2): qty 1

## 2020-01-10 MED ORDER — SODIUM CHLORIDE 0.9 % IV BOLUS
500.0000 mL | Freq: Once | INTRAVENOUS | Status: AC
  Administered 2020-01-10: 16:00:00 500 mL via INTRAVENOUS

## 2020-01-10 MED ORDER — METOPROLOL TARTRATE 25 MG PO TABS
25.0000 mg | ORAL_TABLET | Freq: Two times a day (BID) | ORAL | Status: DC
  Administered 2020-01-10 – 2020-01-12 (×4): 25 mg via ORAL
  Filled 2020-01-10 (×4): qty 1

## 2020-01-10 MED ORDER — INSULIN ASPART 100 UNIT/ML ~~LOC~~ SOLN
0.0000 [IU] | Freq: Three times a day (TID) | SUBCUTANEOUS | Status: DC
  Administered 2020-01-11: 3 [IU] via SUBCUTANEOUS
  Administered 2020-01-11: 2 [IU] via SUBCUTANEOUS

## 2020-01-10 MED ORDER — LEVOTHYROXINE SODIUM 25 MCG PO TABS
25.0000 ug | ORAL_TABLET | Freq: Every day | ORAL | Status: DC
  Administered 2020-01-11 – 2020-01-12 (×2): 25 ug via ORAL
  Filled 2020-01-10 (×2): qty 1

## 2020-01-10 MED ORDER — INSULIN GLARGINE 100 UNIT/ML ~~LOC~~ SOLN
20.0000 [IU] | Freq: Every day | SUBCUTANEOUS | Status: DC
  Administered 2020-01-11: 11:00:00 20 [IU] via SUBCUTANEOUS
  Filled 2020-01-10 (×3): qty 0.2

## 2020-01-10 MED ORDER — SODIUM CHLORIDE 0.9 % IV SOLN: INTRAVENOUS | Status: DC

## 2020-01-10 MED ORDER — ATORVASTATIN CALCIUM 40 MG PO TABS
80.0000 mg | ORAL_TABLET | Freq: Every evening | ORAL | Status: DC
  Administered 2020-01-10 – 2020-01-11 (×2): 80 mg via ORAL
  Filled 2020-01-10 (×2): qty 2

## 2020-01-10 MED ORDER — CALCIUM GLUCONATE-NACL 1-0.675 GM/50ML-% IV SOLN
1.0000 g | Freq: Once | INTRAVENOUS | Status: AC
  Administered 2020-01-10: 18:00:00 1000 mg via INTRAVENOUS
  Filled 2020-01-10: qty 50

## 2020-01-10 MED ORDER — ONDANSETRON HCL 4 MG/2ML IJ SOLN
4.0000 mg | Freq: Once | INTRAMUSCULAR | Status: AC
  Administered 2020-01-10: 16:00:00 4 mg via INTRAVENOUS
  Filled 2020-01-10: qty 2

## 2020-01-10 MED ORDER — OXYCODONE HCL 5 MG PO TABS
5.0000 mg | ORAL_TABLET | ORAL | Status: DC | PRN
  Administered 2020-01-11: 09:00:00 5 mg via ORAL
  Filled 2020-01-10: qty 1

## 2020-01-10 MED ORDER — ENOXAPARIN SODIUM 40 MG/0.4ML ~~LOC~~ SOLN
40.0000 mg | SUBCUTANEOUS | Status: DC
  Administered 2020-01-10 – 2020-01-11 (×2): 40 mg via SUBCUTANEOUS
  Filled 2020-01-10 (×2): qty 0.4

## 2020-01-10 MED ORDER — INSULIN ASPART 100 UNIT/ML ~~LOC~~ SOLN
0.0000 [IU] | Freq: Every day | SUBCUTANEOUS | Status: DC
  Administered 2020-01-10: 22:00:00 2 [IU] via SUBCUTANEOUS
  Administered 2020-01-11: 22:00:00 4 [IU] via SUBCUTANEOUS

## 2020-01-10 MED ORDER — INSULIN DEGLUDEC 100 UNIT/ML ~~LOC~~ SOPN: 20.0000 [IU] | PEN_INJECTOR | Freq: Every morning | SUBCUTANEOUS | Status: DC

## 2020-01-10 MED ORDER — MORPHINE SULFATE (PF) 4 MG/ML IV SOLN
4.0000 mg | Freq: Once | INTRAVENOUS | Status: AC
  Administered 2020-01-10: 16:00:00 4 mg via INTRAVENOUS
  Filled 2020-01-10: qty 1

## 2020-01-10 MED ORDER — HYDROCORTISONE 1 % EX CREA
1.0000 "application " | TOPICAL_CREAM | Freq: Three times a day (TID) | CUTANEOUS | Status: DC | PRN
  Administered 2020-01-10 – 2020-01-11 (×2): 1 via TOPICAL
  Filled 2020-01-10: qty 28

## 2020-01-10 NOTE — H&P (Signed)
History and Physical  Lisa Jenkins Lisa Jenkins DOB: 03/29/1951 DOA: 01/10/2020  Referring physician: Dr Melina Copa, ED physician PCP: Denyce Robert, [place]  Outpatient Specialists:   Patient Coming From: home  Chief Complaint: abd pain, pelvic pain  HPI: Lisa Jenkins is a 69 y.o. female with a history of coronary artery disease with STEMI in 2018 and drug-eluting stents in the mid RCA, diabetes type 2, hypertension.  Patient was admitted overnight in the hospital on 5/6 due to this abdominal pain that has been ongoing for the past 2 weeks.  Pain is located in the left lower quadrant and is nonradiating.  She was noted to have a potassium of greater than 7.  Her potassium was aggressively managed the patient was sent home with her potassium under control.  It was assumed at that time that her potassium level was due to her use of months over the past few months.  It was noted, however, that she had a large pelvic mass measuring 11 x 8.5 x 8.5 cm that had both cystic and solid structures.  A CA-125 was drawn which was elevated in the 80s.  She returns today due to increased pain that is similar in nature to previous.  Emergency Department Course: Laboratory data shows potassium of 5.9 and a creatinine that is elevated to 0.5, baseline 0.9).  Review of Systems:   Pt denies any fevers, chills, nausea, vomiting, diarrhea, constipation, shortness of breath, dyspnea on exertion, orthopnea, cough, wheezing, palpitations, headache, vision changes, lightheadedness, dizziness, melena, rectal bleeding.  Review of systems are otherwise negative  Past Medical History:  Diagnosis Date  . Coronary artery disease    a. s/p NSTEMI in 07/2017 with DES to mid-RCA and residual disease along proximal-mid LAD  . Diabetes mellitus without complication ([place])   . High cholesterol   . Hyperkalemia   . Hypertension   . Myocardial infarction [facility])    Past Surgical History:  Procedure Laterality Date  . BUNIONECTOMY  Bilateral   . CORONARY STENT INTERVENTION N/A 07/09/2017   Procedure: CORONARY STENT INTERVENTION;  Surgeon: Belva Crome, MD;  Location: [place] [facility];  Service: Cardiovascular;  Laterality: N/A;  . LEFT HEART CATH AND CORONARY ANGIOGRAPHY N/A 07/09/2017   Procedure: LEFT HEART CATH AND CORONARY ANGIOGRAPHY;  Surgeon: Belva Crome, MD;  Location: [place] [facility];  Service: Cardiovascular;  Laterality: N/A;   Social History:  reports that she has been smoking cigarettes. She has a 11.50 pack-year smoking history. She has never used smokeless tobacco. She reports that she does not drink alcohol or use drugs. Patient lives at home  No Known Allergies  Family History  Problem Relation Age of Onset  . Diabetes Mother   . Heart disease Mother   . Heart disease Father   . Diabetes Sister   . Heart disease Sister   . Diabetes Brother   . Heart disease Brother   . Diabetes Brother   . Heart disease Brother   . Diabetes Brother   . Heart disease Brother   . Diabetes Brother   . Heart disease Brother   . Diabetes Brother   . Heart disease Brother       Prior to Admission medications   Medication Sig Start Date End Date Taking? Authorizing Provider  aspirin 81 MG chewable tablet Chew 81 mg by mouth daily.    Yes [provider]  atorvastatin (LIPITOR) 80 MG tablet TAKE 1 Tablet BY MOUTH ONCE DAILY AT 6:00PM  Patient taking differently: Take 80 mg by mouth every evening. TAKE 1 Tablet BY MOUTH ONCE DAILY AT 6:00PM 02/05/18  Yes Soyla Dryer, PA-C  chlorthalidone (HYGROTON) 25 MG tablet Take 1 tablet (25 mg total) by mouth daily. 03/27/19 01/10/20 Yes Strader, Fransisco Hertz, PA-C  metFORMIN (GLUCOPHAGE) 1000 MG tablet TAKE 1 Tablet  BY MOUTH TWICE DAILY WITH MEALS Patient taking differently: Take 1,000 mg by mouth 2 (two) times daily with a meal.  04/28/18  Yes Soyla Dryer, PA-C  metoprolol tartrate (LOPRESSOR) 25 MG tablet TAKE 1 Tablet  BY MOUTH TWICE DAILY Patient  taking differently: Take 25 mg by mouth 2 (two) times daily.  04/28/18  Yes Soyla Dryer, PA-C  OZEMPIC, 1 MG/DOSE, 2 MG/1.5ML SOPN Inject 1 mg into the skin every Thursday. 12/18/19  Yes [provider]  SYNTHROID 25 MCG tablet TAKE 1 Tablet BY MOUTH ONCE DAILY BEFORE BREAKFAST Patient taking differently: Take 25 mcg by mouth daily before breakfast.  01/04/18  Yes McElroy, Larene Beach, PA-C  TRESIBA FLEXTOUCH 100 UNIT/ML SOPN FlexTouch Pen Inject 20 Units into the skin every morning.  02/26/19  Yes [provider]  Vitamin D, Ergocalciferol, (DRISDOL) 1.25 MG (50000 UNIT) CAPS capsule Take 50,000 Units by mouth once a week. 12/18/19  Yes [provider]    Physical Exam: BP 140/62   Pulse 73   Temp 98.2 F (36.8 C) (Oral)   Resp 19   Ht 5\' 3"  (1.6 m)   Wt 65.3 kg   SpO2 97%   BMI 25.51 kg/m   . General: Elderly female. Awake and alert and oriented x3. No acute cardiopulmonary distress.  Marland Kitchen HEENT: Normocephalic atraumatic.  Right and left ears normal in appearance.  Pupils equal, round, reactive to light. Extraocular muscles are intact. Sclerae anicteric and noninjected.  Moist mucosal membranes. No mucosal lesions.  . Neck: Neck supple without lymphadenopathy. No carotid bruits. No masses palpated.  . Cardiovascular: Regular rate with normal S1-S2 sounds. No murmurs, rubs, gallops auscultated. No JVD.  Marland Kitchen Respiratory: Good respiratory effort with no wheezes, rales, rhonchi. Lungs clear to auscultation bilaterally.  No accessory muscle use. . Abdomen: Soft, tender in the left lower quadrant without rebound or guarding. Nondistended. Active bowel sounds. No masses or hepatosplenomegaly  . Skin: No rashes, lesions, or ulcerations.  Dry, warm to touch. 2+ dorsalis pedis and radial pulses. . Musculoskeletal: No calf or leg pain. All major joints not erythematous nontender.  No upper or lower joint deformation.  Good ROM.  No contractures  . Psychiatric: Intact judgment and  insight. Pleasant and cooperative. . Neurologic: No focal neurological deficits. Strength is 5/5 and symmetric in upper and lower extremities.  Cranial nerves II through XII are grossly intact.           Labs on Admission: I have personally reviewed following labs and imaging studies  CBC: Recent Labs  Lab 01/07/20 1916 01/08/20 0222 01/10/20 1557  WBC 14.2* 13.6* 14.1*  NEUTROABS 10.6*  --  11.1*  HGB 11.0* 10.7* 10.9*  HCT 35.4* 34.3* 34.6*  MCV 97.5 97.4 98.0  PLT 419* 391 [id number]*   Basic Metabolic Panel: Recent Labs  Lab 01/07/20 1916 01/07/20 1916 01/07/20 2037 01/08/20 0222 01/08/20 1225 01/08/20 1629 01/10/20 1557  NA 134*  --   --  136  --   --  133*  K >7.5*   < > >7.5* 6.2*  6.2* 5.7* 5.2* 5.9*  CL 112*  --   --  109  --   --  106  CO2 16*  --   --  19*  --   --  15*  GLUCOSE 110*  --   --  77  --   --  277*  BUN 20  --   --  18  --   --  30*  CREATININE 1.02*  --   --  0.91  --   --  1.50*  CALCIUM 9.3  --   --  9.6  --   --  8.6*  MG 1.3*  --   --   --   --   --   --    < > = values in this interval not displayed.   GFR: Estimated Creatinine Clearance: 33.5 mL/min (A) (by C-G formula based on SCr of 1.5 mg/dL (H)). Liver Function Tests: Recent Labs  Lab 01/07/20 1916 01/08/20 0222  AST 10* 10*  ALT 9 10  ALKPHOS 124 119  BILITOT 0.4 0.4  PROT 7.0 6.6  ALBUMIN 3.3* 3.2*   Recent Labs  Lab 01/07/20 1916 01/10/20 1557  LIPASE 40 61*   No results for input(s): AMMONIA in the last 168 hours. Coagulation Profile: No results for input(s): INR, PROTIME in the last 168 hours. Cardiac Enzymes: No results for input(s): CKTOTAL, CKMB, CKMBINDEX, TROPONINI in the last 168 hours. BNP (last 3 results) No results for input(s): PROBNP in the last 8760 hours. HbA1C: No results for input(s): HGBA1C in the last 72 hours. CBG: No results for input(s): GLUCAP in the last 168 hours. Lipid Profile: No results for input(s): CHOL, HDL, LDLCALC, TRIG, CHOLHDL,  LDLDIRECT in the last 72 hours. Thyroid Function Tests: No results for input(s): TSH, T4TOTAL, FREET4, T3FREE, THYROIDAB in the last 72 hours. Anemia Panel: No results for input(s): VITAMINB12, FOLATE, FERRITIN, TIBC, IRON, RETICCTPCT in the last 72 hours. Urine analysis:    Component Value Date/Time   COLORURINE YELLOW 07/28/2016 1703   APPEARANCEUR CLEAR 07/28/2016 1703   LABSPEC 1.010 07/28/2016 1703   PHURINE 6.0 07/28/2016 1703   GLUCOSEU >1000 (A) 07/28/2016 1703   HGBUR TRACE (A) 07/28/2016 1703   BILIRUBINUR NEGATIVE 07/28/2016 1703   KETONESUR NEGATIVE 07/28/2016 1703   PROTEINUR 100 (A) 07/28/2016 1703   NITRITE NEGATIVE 07/28/2016 1703   LEUKOCYTESUR NEGATIVE 07/28/2016 1703   Sepsis Labs: @LABRCNTIP (procalcitonin:4,lacticidven:4) ) Recent Results (from the past 240 hour(s))  Respiratory Panel by RT PCR (Flu A&B, Covid) - Nasopharyngeal Swab     Status: None   Collection Time: 01/07/20 11:43 PM   Specimen: Nasopharyngeal Swab  Result Value Ref Range Status   SARS Coronavirus 2 by RT PCR NEGATIVE NEGATIVE Final    Comment: (NOTE) SARS-CoV-2 target nucleic acids are NOT DETECTED. The SARS-CoV-2 RNA is generally detectable in upper respiratoy specimens during the acute phase of infection. The lowest concentration of SARS-CoV-2 viral copies this assay can detect is 131 copies/mL. A negative result does not preclude SARS-Cov-2 infection and should not be used as the sole basis for treatment or other patient management decisions. A negative result may occur with  improper specimen collection/handling, submission of specimen other than nasopharyngeal swab, presence of viral mutation(s) within the areas targeted by this assay, and inadequate number of viral copies (<131 copies/mL). A negative result must be combined with clinical observations, patient history, and epidemiological information. The expected result is Negative. Fact Sheet for Patients:    PinkCheek.be Fact Sheet for Healthcare Providers:  GravelBags.it This test is not yet ap proved or cleared by the [place]  FDA and  has been authorized for detection and/or diagnosis of SARS-CoV-2 by FDA under an Emergency Use Authorization (EUA). This EUA will remain  in effect (meaning this test can be used) for the duration of the COVID-19 declaration under Section 564(b)(1) of the Act, 21 U.S.C. section 360bbb-3(b)(1), unless the authorization is terminated or revoked sooner.    Influenza A by PCR NEGATIVE NEGATIVE Final   Influenza B by PCR NEGATIVE NEGATIVE Final    Comment: (NOTE) The Xpert Xpress SARS-CoV-2/FLU/RSV assay is intended as an aid in  the diagnosis of influenza from Nasopharyngeal swab specimens and  should not be used as a sole basis for treatment. Nasal washings and  aspirates are unacceptable for Xpert Xpress SARS-CoV-2/FLU/RSV  testing. Fact Sheet for Patients: PinkCheek.be Fact Sheet for Healthcare Providers: GravelBags.it This test is not yet approved or cleared by the [place] FDA and  has been authorized for detection and/or diagnosis of SARS-CoV-2 by  FDA under an Emergency Use Authorization (EUA). This EUA will remain  in effect (meaning this test can be used) for the duration of the  Covid-19 declaration under Section 564(b)(1) of the Act, 21  U.S.C. section 360bbb-3(b)(1), unless the authorization is  terminated or revoked. Performed at [facility], [address], [place], [place] [address]      Radiological Exams on Admission: No results found.  EKG: Independently reviewed. 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  We also discussed care given by the ED provider.  1. Hyperkalemia a. Observation b. Patient has not been using supplemental salts c. Possible early tumor lysis syndrome?  Does have slightly low calcium d. Patient received 1 dose of Lokelma e. Calcium gluconate f. Check potassium later this evening, along with uric acid g. Check potassium tomorrow morning 2. Pelvic mass a. 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 Patient has GYN follow-up in the middle of next week c. Also needs MRI -patient is still here when MRI is available, would consider getting pelvic MRI better evaluate pelvic mass 3. Acute kidney injury a. IV fluids b. Recheck in the morning 4. Type 2 diabetes a. Sliding scale 5. Hypertension a. Continue antihypertensives 6. Hypothyroidism a. Continue Synthroid 7. Leukocytosis a. Uncertain etiology b. Appears fairly stable c. No evidence of infection at this moment d. Repeat CBC in the morning  DVT prophylaxis: Lovenox Consultants: None Code Status: Full code Family Communication: Daughter present during interview and exam Disposition Plan: Patient should be able to return home following improvement of potassium and renal function   Truett Mainland, DO

## 2020-01-10 NOTE — ED Notes (Signed)
Pt was given a dinner tray.  

## 2020-01-10 NOTE — ED Triage Notes (Signed)
Patient c/o lower abd pain/pelvic pain x2 weeks. Per patient seen here in ED and referred to gynecologist but appointment is not until Wednesday. Patient states ?cyst or tumors involving uterus or ovaries. Denies any nausea, vomiting, diarrhea, fevers, or urinary symptoms. Per patient see was given diagnosis of hyperkalemia 2 days ago in ED.

## 2020-01-10 NOTE — ED Provider Notes (Signed)
[facility] EMERGENCY DEPARTMENT Provider Note   CSN: [id number] Arrival date & time: 01/10/20  1509     History Chief Complaint  Patient presents with  . Abdominal Pain    Lisa Jenkins is a 69 y.o. female.  She has a history of coronary disease diabetes hypertension.  She was admitted a few days ago for lower abdominal pain and hyperkalemia and found to have a pelvic mass.  It does not look like they put her on any pain medicine.  She has a follow-up with gynecology in 4 days.  Complaining of worsening low abdominal pain that she rates at 7 out of 10.  Associated with some constipation.  No fevers chills nausea vomiting or urinary symptoms.  No vaginal bleeding or discharge.  The history is provided by the patient.  Abdominal Pain Pain location:  Suprapubic, LLQ and RLQ Pain quality: aching   Pain radiates to:  Does not radiate Pain severity now: 7/10. Onset quality:  Gradual Timing:  Constant Progression:  Worsening Chronicity:  New Context: not trauma   Relieved by:  None tried Worsened by:  Nothing Ineffective treatments:  None tried Associated symptoms: constipation   Associated symptoms: no chest pain, no cough, no diarrhea, no dysuria, no fever, no hematemesis, no hematochezia, no hematuria, no nausea, no shortness of breath, no sore throat, no vaginal bleeding, no vaginal discharge and no vomiting        Past Medical History:  Diagnosis Date  . Coronary artery disease    a. s/p NSTEMI in 07/2017 with DES to mid-RCA and residual disease along proximal-mid LAD  . Diabetes mellitus without complication ([place])   . High cholesterol   . Hyperkalemia   . Hypertension   . Myocardial infarction [facility])     Patient Active Problem List   Diagnosis Date Noted  . Hyperkalemia 01/08/2020  . Pelvic mass 01/08/2020  . NSTEMI (non-ST elevated myocardial infarction) ([place]) 07/08/2017  . Community acquired pneumonia of right upper lobe of lung   . Chest pain 07/28/2016  .  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 Hypothyroidism 10/21/2015  . Uncontrolled type 2 diabetes mellitus with complication ([place]) [id number]  . Type 2 diabetes mellitus with complication ([place]) [id number]  . Hyperlipidemia 07/21/2015  . Essential hypertension, benign 07/21/2015  . Cigarette nicotine dependence, uncomplicated [id number]  . Type 2 diabetes mellitus with retinopathy ([place]) 07/21/2015  . Proteinuria 07/21/2015  . Thyroid activity decreased 07/21/2015    Past Surgical History:  Procedure Laterality Date  . BUNIONECTOMY Bilateral   . CORONARY STENT INTERVENTION N/A 07/09/2017   Procedure: CORONARY STENT INTERVENTION;  Surgeon: Belva Crome, MD;  Location: [facility];  Service: Cardiovascular;  Laterality: N/A;  . LEFT HEART CATH AND CORONARY ANGIOGRAPHY N/A 07/09/2017   Procedure: LEFT HEART CATH AND CORONARY ANGIOGRAPHY;  Surgeon: Belva Crome, MD;  Location: [place] [facility];  Service: Cardiovascular;  Laterality: N/A;     OB History    Gravida      Para      Term      Preterm      AB      Living  0     SAB      TAB      Ectopic      Multiple      Live Births              Family History  Problem Relation Age of Onset  . Diabetes Mother   .  Heart disease Mother   . Heart disease Father   . Diabetes Sister   . Heart disease Sister   . Diabetes Brother   . Heart disease Brother   . Diabetes Brother   . Heart disease Brother   . Diabetes Brother   . Heart disease Brother   . Diabetes Brother   . Heart disease Brother   . Diabetes Brother   . Heart disease Brother     Social History   Tobacco Use  . Smoking status: Current Every Day Smoker    Packs/day: 0.25    Years: 46.00    Pack years: 11.50    Types: Cigarettes  . Smokeless tobacco: Never Used  Substance Use Topics  . Alcohol use: No    Alcohol/week: 0.0 standard drinks  . Drug use: No    Home Medications Prior to Admission medications   Medication Sig Start  Date End Date Taking? Authorizing Provider  aspirin 81 MG chewable tablet Chew 81 mg by mouth daily.     [provider]  atorvastatin (LIPITOR) 80 MG tablet TAKE 1 Tablet BY MOUTH ONCE DAILY AT 6:00PM Patient taking differently: Take 80 mg by mouth every evening. TAKE 1 Tablet BY MOUTH ONCE DAILY AT 6:00PM 02/05/18   Soyla Dryer, PA-C  chlorthalidone (HYGROTON) 25 MG tablet Take 1 tablet (25 mg total) by mouth daily. 03/27/19 01/07/20  Strader, Fransisco Hertz, PA-C  diphenhydrAMINE (BENADRYL) 25 MG tablet Take 1 tablet (25 mg total) by mouth every 6 (six) hours as needed for itching. 05/14/19   Triplett, Tammy, PA-C  metFORMIN (GLUCOPHAGE) 1000 MG tablet TAKE 1 Tablet  BY MOUTH TWICE DAILY WITH MEALS Patient taking differently: Take 1,000 mg by mouth 2 (two) times daily with a meal.  04/28/18   Soyla Dryer, PA-C  metoprolol tartrate (LOPRESSOR) 25 MG tablet TAKE 1 Tablet  BY MOUTH TWICE DAILY 04/28/18   Soyla Dryer, PA-C  OZEMPIC, 1 MG/DOSE, 2 MG/1.5ML SOPN Inject 1 mg into the skin every Thursday. 12/18/19   [provider]  potassium chloride SA (KLOR-CON) 20 MEQ tablet Take 1 tablet (20 mEq total) by mouth daily. 07/03/19   Strader, Fransisco Hertz, PA-C  SYNTHROID 25 MCG tablet TAKE 1 Tablet BY MOUTH ONCE DAILY BEFORE BREAKFAST Patient taking differently: Take 25 mcg by mouth daily before breakfast.  01/04/18   Soyla Dryer, PA-C  TRESIBA FLEXTOUCH 100 UNIT/ML SOPN FlexTouch Pen Inject 20 Units into the skin every morning.  02/26/19   [provider]  Vitamin D, Ergocalciferol, (DRISDOL) 1.25 MG (50000 UNIT) CAPS capsule Take 50,000 Units by mouth once a week. 12/18/19   [provider]    Allergies    Patient has no known allergies.  Review of Systems   Review of Systems  Constitutional: Negative for fever.  HENT: Negative for sore throat.   Eyes: Negative for visual disturbance.  Respiratory: Negative for cough and shortness of breath.     Cardiovascular: Negative for chest pain.  Gastrointestinal: Positive for abdominal pain and constipation. Negative for diarrhea, hematemesis, hematochezia, nausea and vomiting.  Genitourinary: Negative for dysuria, hematuria, vaginal bleeding and vaginal discharge.  Musculoskeletal: Negative for back pain.  Skin: Negative for rash.  Neurological: Negative for headaches.    Physical Exam Updated Vital Signs BP (!) 144/58 (BP Location: Left Arm)   Pulse 88   Temp 98.2 F (36.8 C) (Oral)   Resp 18   Ht 5\' 3"  (1.6 m)   Wt 65.3 kg  SpO2 100%   BMI 25.51 kg/m   Physical Exam Vitals and nursing note reviewed.  Constitutional:      General: She is not in acute distress.    Appearance: She is well-developed.  HENT:     Head: Normocephalic and atraumatic.  Eyes:     Conjunctiva/sclera: Conjunctivae normal.  Cardiovascular:     Rate and Rhythm: Normal rate and regular rhythm.     Heart sounds: No murmur.  Pulmonary:     Effort: Pulmonary effort is normal. No respiratory distress.     Breath sounds: Normal breath sounds.  Abdominal:     Palpations: Abdomen is soft.     Tenderness: There is no abdominal tenderness. There is no guarding or rebound.  Musculoskeletal:        General: No deformity or signs of injury. Normal range of motion.     Cervical back: Neck supple.  Skin:    General: Skin is warm and dry.     Capillary Refill: Capillary refill takes less than 2 seconds.  Neurological:     General: No focal deficit present.     Mental Status: She is alert.     ED Results / Procedures / Treatments   Labs (all labs ordered are listed, but only abnormal results are displayed) Labs Reviewed  CBC WITH DIFFERENTIAL/PLATELET - Abnormal; Notable for the following components:      Result Value   WBC 14.1 (*)    RBC 3.53 (*)    Hemoglobin 10.9 (*)    HCT 34.6 (*)    Platelets 408 (*)    Neutro Abs 11.1 (*)    All other components within normal limits  LIPASE, BLOOD -  Abnormal; Notable for the following components:   Lipase 61 (*)    All other components within normal limits  BASIC METABOLIC PANEL - Abnormal; Notable for the following components:   Sodium 133 (*)    Potassium 5.9 (*)    CO2 15 (*)    Glucose, Bld 277 (*)    BUN 30 (*)    Creatinine, Ser 1.50 (*)    Calcium 8.6 (*)    GFR calc non Af Amer 36 (*)    GFR calc Af Amer 42 (*)    All other components within normal limits  BASIC METABOLIC PANEL - Abnormal; Notable for the following components:   CO2 20 (*)    Glucose, Bld 137 (*)    BUN 29 (*)    Creatinine, Ser 1.08 (*)    Calcium 8.5 (*)    GFR calc non Af Amer 53 (*)    All other components within normal limits  GLUCOSE, CAPILLARY - Abnormal; Notable for the following components:   Glucose-Capillary 220 (*)    All other components within normal limits  URIC ACID - Abnormal; Notable for the following components:   Uric Acid, Serum 8.9 (*)    All other components within normal limits  BASIC METABOLIC PANEL - Abnormal; Notable for the following components:   CO2 21 (*)    Glucose, Bld 130 (*)    BUN 24 (*)    Calcium 8.6 (*)    All other components within normal limits  CBC - Abnormal; Notable for the following components:   WBC 10.8 (*)    RBC 3.22 (*)    Hemoglobin 9.6 (*)    HCT 31.1 (*)    All other components within normal limits  GLUCOSE, CAPILLARY - Abnormal; Notable for the following  components:   Glucose-Capillary 121 (*)    All other components within normal limits  RESPIRATORY PANEL BY RT PCR (FLU A&B, COVID)  URINALYSIS, ROUTINE W REFLEX MICROSCOPIC  HEMOGLOBIN A1C    EKG EKG Interpretation  Date/Time:  Saturday Jan 10 2020 15:53:58 EDT Ventricular Rate:  74 PR Interval:    QRS Duration: 100 QT Interval:  379 QTC Calculation: 421 R Axis:   -4 Text Interpretation: Sinus rhythm Low voltage, precordial leads No significant change since prior 5/21 Confirmed by Aletta Edouard [phone]) on 01/10/2020 3:56:00  PM   Radiology No results found.  Procedures .Critical Care Performed by: Hayden Rasmussen, MD Authorized by: Hayden Rasmussen, MD   Critical care provider statement:    Critical care time (minutes):  45   Critical care time was exclusive of:  Separately billable procedures and treating other patients   Critical care was necessary to treat or prevent imminent or life-threatening deterioration of the following conditions:  Metabolic crisis   Critical care was time spent personally by me on the following activities:  Discussions with consultants, evaluation of patient's response to treatment, examination of patient, ordering and performing treatments and interventions, ordering and review of laboratory studies, ordering and review of radiographic studies, pulse oximetry, re-evaluation of patient's condition, obtaining history from patient or surrogate, review of old charts and development of treatment plan with patient or surrogate   I assumed direction of critical care for this patient from another provider in my specialty: no     (including critical care time)  Medications Ordered in ED Medications  aspirin chewable tablet 81 mg (has no administration in time range)  atorvastatin (LIPITOR) tablet 80 mg (80 mg Oral Given 01/10/20 2209)  metoprolol tartrate (LOPRESSOR) tablet 25 mg (25 mg Oral Given 01/10/20 2208)  levothyroxine (SYNTHROID) tablet 25 mcg (25 mcg Oral Given 01/11/20 0516)  enoxaparin (LOVENOX) injection 40 mg (40 mg Subcutaneous Given 01/10/20 2208)  0.9 %  sodium chloride infusion ( Intravenous New Bag/Given 01/11/20 0847)  insulin aspart (novoLOG) injection 0-15 Units (2 Units Subcutaneous Given 01/11/20 0848)  insulin aspart (novoLOG) injection 0-5 Units (2 Units Subcutaneous Given 01/10/20 2208)  oxyCODONE (Oxy IR/ROXICODONE) immediate release tablet 5 mg (5 mg Oral Given 01/11/20 0847)  insulin glargine (LANTUS) injection 20 Units (has no administration in time range)    hydrocortisone cream 1 % 1 application (1 application Topical Given 01/10/20 2312)  sodium zirconium cyclosilicate (LOKELMA) packet 10 g (has no administration in time range)  sodium chloride 0.9 % bolus 500 mL (0 mLs Intravenous Stopped 01/10/20 1727)  morphine 4 MG/ML injection 4 mg (4 mg Intravenous Given 01/10/20 1623)  ondansetron (ZOFRAN) injection 4 mg (4 mg Intravenous Given 01/10/20 1623)  sodium zirconium cyclosilicate (LOKELMA) packet 10 g (10 g Oral Given 01/10/20 1643)  calcium gluconate 1 g/ 50 mL sodium chloride IVPB (0 mg Intravenous Stopped 01/10/20 1836)    ED Course  I have reviewed the triage vital signs and the nursing notes.  Pertinent labs & imaging results that were available during my care of the patient were reviewed by me and considered in my medical decision making (see chart for details).  Clinical Course as of Jan 10 906  Sat Jan 10, 2020  1643 Patient's labs showing a new worsening renal function and low bicarb along with an elevated potassium of 5.9.  EKG not showing any peaked T's.  Ordered her IV fluids and Lokelma.  Will need admission for further management of  her hyperkalemia.   [MB]  [id number] Discussed with [facility] hospitalist Dr. Sheran Lawless who will evaluate the patient for admission.  Patient is very hesitant to be admitted and I try to make the case to her that she has had worsening renal function and elevated potassium.  She said she has not been using her potassium containing supplements salt or taking her ACE inhibitor.   [MB]  [id number] Apparently patient does not need Covid testing as she was tested less than 72 hours ago.   [MB]    Clinical Course User Index [MB] Hayden Rasmussen, MD   MDM Rules/Calculators/A&P                     This patient complains of pelvic pain; this involves an extensive number of treatment Options and is a complaint that carries with it a high risk of complications and Morbidity. The differential includes worsening pelvic mass, UTI,  metabolic derangement, arrhythmia  I ordered, reviewed and interpreted labs, which included chemistry with a critically high potassium of 5.9 and elevated creatinine of 1.5. I ordered medication IV fluids pain medication and look, to decrease potassium Additional history obtained from patient's daughter Previous records obtained and reviewed in epic including last admission for pelvic mass and hyperkalemia.  It seems at that time they thought it may be related to her ACE inhibitor and her salt substitute that she used.  She says she has not been using it since then. I consulted Dr. Nehemiah Settle [facility] hospitalist and discussed lab and imaging findings  Critical Interventions: Recognition of hyperkalemia and appropriate treatment.  After the interventions stated above, I reevaluated the patient and found the patient to be hemodynamically stable.  She is not happy about needing to be admitted again.   Final Clinical Impression(s) / ED Diagnoses Final diagnoses:  Pelvic pain  Pelvic mass  Acute hyperkalemia  AKI (acute kidney injury) [facility])    Rx / DC Orders ED Discharge Orders    None       Hayden Rasmussen, MD 01/11/20 [phone]

## 2020-01-11 DIAGNOSIS — I252 Old myocardial infarction: Secondary | ICD-10-CM | POA: Diagnosis not present

## 2020-01-11 DIAGNOSIS — Z01818 Encounter for other preprocedural examination: Secondary | ICD-10-CM | POA: Diagnosis not present

## 2020-01-11 DIAGNOSIS — E875 Hyperkalemia: Secondary | ICD-10-CM | POA: Diagnosis present

## 2020-01-11 DIAGNOSIS — Z794 Long term (current) use of insulin: Secondary | ICD-10-CM | POA: Diagnosis not present

## 2020-01-11 DIAGNOSIS — I10 Essential (primary) hypertension: Secondary | ICD-10-CM | POA: Diagnosis present

## 2020-01-11 DIAGNOSIS — Z20822 Contact with and (suspected) exposure to covid-19: Secondary | ICD-10-CM | POA: Diagnosis present

## 2020-01-11 DIAGNOSIS — I25118 Atherosclerotic heart disease of native coronary artery with other forms of angina pectoris: Secondary | ICD-10-CM | POA: Diagnosis not present

## 2020-01-11 DIAGNOSIS — R1900 Intra-abdominal and pelvic swelling, mass and lump, unspecified site: Secondary | ICD-10-CM

## 2020-01-11 DIAGNOSIS — I444 Left anterior fascicular block: Secondary | ICD-10-CM | POA: Diagnosis present

## 2020-01-11 DIAGNOSIS — E790 Hyperuricemia without signs of inflammatory arthritis and tophaceous disease: Secondary | ICD-10-CM | POA: Diagnosis present

## 2020-01-11 DIAGNOSIS — I2510 Atherosclerotic heart disease of native coronary artery without angina pectoris: Secondary | ICD-10-CM | POA: Diagnosis present

## 2020-01-11 DIAGNOSIS — E039 Hypothyroidism, unspecified: Secondary | ICD-10-CM | POA: Diagnosis present

## 2020-01-11 DIAGNOSIS — Z7982 Long term (current) use of aspirin: Secondary | ICD-10-CM | POA: Diagnosis not present

## 2020-01-11 DIAGNOSIS — N179 Acute kidney failure, unspecified: Secondary | ICD-10-CM | POA: Diagnosis present

## 2020-01-11 DIAGNOSIS — E7800 Pure hypercholesterolemia, unspecified: Secondary | ICD-10-CM | POA: Diagnosis present

## 2020-01-11 DIAGNOSIS — Z8249 Family history of ischemic heart disease and other diseases of the circulatory system: Secondary | ICD-10-CM | POA: Diagnosis not present

## 2020-01-11 DIAGNOSIS — E1169 Type 2 diabetes mellitus with other specified complication: Secondary | ICD-10-CM | POA: Diagnosis present

## 2020-01-11 DIAGNOSIS — F17210 Nicotine dependence, cigarettes, uncomplicated: Secondary | ICD-10-CM | POA: Diagnosis present

## 2020-01-11 DIAGNOSIS — Z833 Family history of diabetes mellitus: Secondary | ICD-10-CM | POA: Diagnosis not present

## 2020-01-11 DIAGNOSIS — E11319 Type 2 diabetes mellitus with unspecified diabetic retinopathy without macular edema: Secondary | ICD-10-CM | POA: Diagnosis present

## 2020-01-11 DIAGNOSIS — Z79899 Other long term (current) drug therapy: Secondary | ICD-10-CM | POA: Diagnosis not present

## 2020-01-11 DIAGNOSIS — D72829 Elevated white blood cell count, unspecified: Secondary | ICD-10-CM

## 2020-01-11 DIAGNOSIS — Z955 Presence of coronary angioplasty implant and graft: Secondary | ICD-10-CM | POA: Diagnosis not present

## 2020-01-11 DIAGNOSIS — E785 Hyperlipidemia, unspecified: Secondary | ICD-10-CM | POA: Diagnosis present

## 2020-01-11 DIAGNOSIS — R1031 Right lower quadrant pain: Secondary | ICD-10-CM | POA: Diagnosis present

## 2020-01-11 LAB — BASIC METABOLIC PANEL
Anion gap: 7 (ref 5–15)
Anion gap: 8 (ref 5–15)
BUN: 24 mg/dL — ABNORMAL HIGH (ref 8–23)
BUN: 29 mg/dL — ABNORMAL HIGH (ref 8–23)
CO2: 20 mmol/L — ABNORMAL LOW (ref 22–32)
CO2: 21 mmol/L — ABNORMAL LOW (ref 22–32)
Calcium: 8.5 mg/dL — ABNORMAL LOW (ref 8.9–10.3)
Calcium: 8.6 mg/dL — ABNORMAL LOW (ref 8.9–10.3)
Chloride: 109 mmol/L (ref 98–111)
Chloride: 110 mmol/L (ref 98–111)
Creatinine, Ser: 0.92 mg/dL (ref 0.44–1.00)
Creatinine, Ser: 1.08 mg/dL — ABNORMAL HIGH (ref 0.44–1.00)
GFR calc Af Amer: >60 mL/min (ref >60)
GFR calc Af Amer: >60 mL/min (ref >60)
GFR calc non Af Amer: 53 mL/min — ABNORMAL LOW (ref >60)
GFR calc non Af Amer: >60 mL/min (ref >60)
Glucose, Bld: 130 mg/dL — ABNORMAL HIGH (ref 70–99)
Glucose, Bld: 137 mg/dL — ABNORMAL HIGH (ref 70–99)
Potassium: 4.8 mmol/L (ref 3.5–5.1)
Potassium: 5.1 mmol/L (ref 3.5–5.1)
Sodium: 137 mmol/L (ref 135–145)
Sodium: 138 mmol/L (ref 135–145)

## 2020-01-11 LAB — CBC
HCT: 31.1 % — ABNORMAL LOW (ref 36.0–46.0)
Hemoglobin: 9.6 g/dL — ABNORMAL LOW (ref 12.0–15.0)
MCH: 29.8 pg (ref 26.0–34.0)
MCHC: 30.9 g/dL (ref 30.0–36.0)
MCV: 96.6 fL (ref 80.0–100.0)
Platelets: 399 10*3/uL (ref 150–400)
RBC: 3.22 MIL/uL — ABNORMAL LOW (ref 3.87–5.11)
RDW: 12.5 % (ref 11.5–15.5)
WBC: 10.8 10*3/uL — ABNORMAL HIGH (ref 4.0–10.5)
nRBC: 0 % (ref 0.0–0.2)

## 2020-01-11 LAB — HEMOGLOBIN A1C
Hgb A1c MFr Bld: 8.7 % — ABNORMAL HIGH (ref 4.8–5.6)
Mean Plasma Glucose: 202.99 mg/dL

## 2020-01-11 LAB — GLUCOSE, CAPILLARY
Glucose-Capillary: 121 mg/dL — ABNORMAL HIGH (ref 70–99)
Glucose-Capillary: 189 mg/dL — ABNORMAL HIGH (ref 70–99)
Glucose-Capillary: 59 mg/dL — ABNORMAL LOW (ref 70–99)
Glucose-Capillary: 140 mg/dL — ABNORMAL HIGH (ref 70–99)
Glucose-Capillary: 312 mg/dL — ABNORMAL HIGH (ref 70–99)

## 2020-01-11 LAB — URIC ACID: Uric Acid, Serum: 8.9 mg/dL — ABNORMAL HIGH (ref 2.5–7.1)

## 2020-01-11 LAB — URINALYSIS, ROUTINE W REFLEX MICROSCOPIC
Bilirubin Urine: NEGATIVE
Glucose, UA: NEGATIVE mg/dL
Hgb urine dipstick: NEGATIVE
Ketones, ur: NEGATIVE mg/dL
Leukocytes,Ua: NEGATIVE
Nitrite: NEGATIVE
Protein, ur: 30 mg/dL — AB
Specific Gravity, Urine: 1.004 — ABNORMAL LOW (ref 1.005–1.030)
pH: 5 (ref 5.0–8.0)

## 2020-01-11 MED ORDER — ALLOPURINOL 100 MG PO TABS
100.0000 mg | ORAL_TABLET | Freq: Every day | ORAL | Status: DC
  Administered 2020-01-11 – 2020-01-12 (×2): 100 mg via ORAL
  Filled 2020-01-11 (×2): qty 1

## 2020-01-11 MED ORDER — SODIUM ZIRCONIUM CYCLOSILICATE 10 G PO PACK
10.0000 g | PACK | Freq: Three times a day (TID) | ORAL | Status: AC
  Administered 2020-01-11 – 2020-01-12 (×3): 10 g via ORAL
  Filled 2020-01-11 (×3): qty 1

## 2020-01-11 MED ORDER — MORPHINE SULFATE (PF) 2 MG/ML IV SOLN: 1.0000 mg | INTRAVENOUS | Status: DC | PRN

## 2020-01-11 MED ORDER — ACETAMINOPHEN 325 MG PO TABS
650.0000 mg | ORAL_TABLET | Freq: Four times a day (QID) | ORAL | Status: DC
  Administered 2020-01-11 – 2020-01-12 (×5): 650 mg via ORAL
  Filled 2020-01-11 (×5): qty 2

## 2020-01-11 MED ORDER — SODIUM ZIRCONIUM CYCLOSILICATE 10 G PO PACK: 10.0000 g | PACK | Freq: Three times a day (TID) | ORAL | Status: DC

## 2020-01-11 NOTE — Progress Notes (Addendum)
PROGRESS NOTE    AZAELIA KINCANNON  [id number] DOB: 10-02-1950 DOA: 01/10/2020 PCP: Denyce Robert, FNP    Brief Narrative:  Patient was admitted to the hospital with a working diagnosis of hyperkalemia and hyperuricemia.  69 year old female with a past coronary disease, type 2 diabetes mellitus, and hypertension.  Patient had a recent hospitalization 5/5-01/08/20 for hyperkalemia and a newly diagnosed pelvic mass.  At home patient had increased pelvic pain, refractive to oral analgesics, and she returned to the hospital 48 hours after discharge.  On her initial physical examination blood pressure 140/62, heart rate 73, temperature 98.2, respiratory rate 19, her lungs were clear to auscultation, heart S1-S2 present and rhythmic, her abdomen was soft and nontender, no lower extremity edema. Sodium 133, potassium 5.9, chloride 106, bicarb 15, glucose 277, BUN 30, creatinine 1.50, white count 14.1, hemoglobin 10.9, hematocrit 34.6, platelets 408.  Uric acid 8.9.  SARS COVID-19 negative.  EKG 74 bpm, left axis deviation, left anterior fascicular block, sinus rhythm, no ST segment or T wave changes.   Assessment & Plan:   Principal Problem:   Hyperkalemia Active Problems:   Essential hypertension, benign   Type 2 diabetes mellitus with retinopathy ([facility])   Hypothyroidism   Pelvic mass   Leukocytosis   AKI (acute kidney injury) ([place])   1. AKI with hyperkalemia and hyperuricemia/ early tumor lysis syndrome. Patient continue to have hyperkalemia despite medical therapy as outpatient with ca gluconate, insulin and sodium zirconium. K today at 5.1 with serum bicarbonate at 21 and serum cr 0.92. Uric acid 8,9  Will continue hydration with isotonic saline at 100 ml per H, will resume sodium zirconium 10 mg tid for 3 doses, and follow renal panel in am. Will start patient on allopurinol and continue hydration with isotonic saline, monitor uric acid in am.   Patient with high risk for worsening  hyperkalemia and hyperuricemia.   2. Pelvic mass, large heterogeneous cystic and solid pelvic mass 11 cm x 8,6 x 8,9 cm. Intermediate risk for malignancy. Patient continue to have abdominal pain, refractive to outpatient therapy. Patient had 4 mh morphine IV on admission and 5 mg oxycodone this am.   Will resume as needed morphine and will continue with as needed oxycodone, will change acetaminophen to scheduled every 6 H. Will consult IR for possible imaging guided biopsy.  3. Uncontrolled T2DM (Hgb A1c 7,9) / dyslipidemia. Fasting glucose this am 130, Will continue glucose cover and monitoring with insulin sliding scale. Basal insulin with 20 units of glargine.   Continue with atorvastatin.  4. HTN. Continue blood pressure control with metoprolol.   5. Hypothyroid. Continue levothyroxine.    Status is: Observation  The patient will require care spanning > 2 midnights and should be moved to inpatient because: Persistent severe electrolyte disturbances, continue to have abdominal pain, refractive to outpatient management. Will need invasive pelvic mass biopsy.   Dispo: The patient is from: Home              Anticipated d/c is to: Home              Anticipated d/c date is: 3 days              Patient currently is not medically stable to d/c.       DVT prophylaxis:  enoxaparin   Code Status:   full  Family Communication:  I spoke over the phone with the patient's sister in law about patient's  condition, plan of care, prognosis  and all questions were addressed.     Subjective: Patient continue to have lower abdominal pain, intensity 0000000, colic in nature with mild improvement with analgesics, worse to movement, no nausea or vomiting.   Objective: Vitals:   01/10/20 2000 01/10/20 2105 01/11/20 0212 01/11/20 0612  BP: (!) 147/81 (!) 156/68 (!) 115/49 (!) 136/55  Pulse: 77 70 63 69  Resp: 19 15 16 17   Temp:  (!) 97.3 F (36.3 C) 97.8 F (36.6 C) 98.2 F (36.8 C)  TempSrc:   Axillary Oral Oral  SpO2: 95% 100% 98% 98%  Weight:      Height:        Intake/Output Summary (Last 24 hours) at 01/11/2020 0851 Last data filed at 01/11/2020 0400 Gross per 24 hour  Intake 768.12 ml  Output --  Net 768.12 ml   Filed Weights   01/10/20 1529  Weight: 65.3 kg    Examination:   General: Not in pain or dyspnea, deconditioned  Neurology: Awake and alert, non focal  E ENT: no pallor, no icterus, oral mucosa moist Cardiovascular: No JVD. S1-S2 present, rhythmic, no gallops, rubs, or murmurs. No lower extremity edema. Pulmonary: posistive breath sounds bilaterally, adequate air movement, no wheezing, rhonchi or rales. Gastrointestinal. Abdomen soft with no rebound or guarding Skin. No rashes Musculoskeletal: no joint deformities     Data Reviewed: I have personally reviewed following labs and imaging studies  CBC: Recent Labs  Lab 01/07/20 1916 01/08/20 0222 01/10/20 1557 01/11/20 0659  WBC 14.2* 13.6* 14.1* 10.8*  NEUTROABS 10.6*  --  11.1*  --   HGB 11.0* 10.7* 10.9* 9.6*  HCT 35.4* 34.3* 34.6* 31.1*  MCV 97.5 97.4 98.0 96.6  PLT 419* 391 408* [id number]   Basic Metabolic Panel: Recent Labs  Lab 01/07/20 1916 01/07/20 2037 01/08/20 0222 01/08/20 0222 01/08/20 1225 01/08/20 1629 01/10/20 1557 01/10/20 2342 01/11/20 0659  NA 134*  --  136  --   --   --  133* 137 138  K >7.5*   < > 6.2*  6.2*   < > 5.7* 5.2* 5.9* 4.8 5.1  CL 112*  --  109  --   --   --  106 110 109  CO2 16*  --  19*  --   --   --  15* 20* 21*  GLUCOSE 110*  --  77  --   --   --  277* 137* 130*  BUN 20  --  18  --   --   --  30* 29* 24*  CREATININE 1.02*  --  0.91  --   --   --  1.50* 1.08* 0.92  CALCIUM 9.3  --  9.6  --   --   --  8.6* 8.5* 8.6*  MG 1.3*  --   --   --   --   --   --   --   --    < > = values in this interval not displayed.   GFR: Estimated Creatinine Clearance: 54.7 mL/min (by C-G formula based on SCr of 0.92 mg/dL). Liver Function Tests: Recent Labs  Lab  01/07/20 1916 01/08/20 0222  AST 10* 10*  ALT 9 10  ALKPHOS 124 119  BILITOT 0.4 0.4  PROT 7.0 6.6  ALBUMIN 3.3* 3.2*   Recent Labs  Lab 01/07/20 1916 01/10/20 1557  LIPASE 40 61*   No results for input(s): AMMONIA in the last 168 hours. Coagulation Profile: No results for input(s): INR,  PROTIME in the last 168 hours. Cardiac Enzymes: No results for input(s): CKTOTAL, CKMB, CKMBINDEX, TROPONINI in the last 168 hours. BNP (last 3 results) No results for input(s): PROBNP in the last 8760 hours. HbA1C: No results for input(s): HGBA1C in the last 72 hours. CBG: Recent Labs  Lab 01/10/20 2109 01/11/20 0719  GLUCAP 220* 121*   Lipid Profile: No results for input(s): CHOL, HDL, LDLCALC, TRIG, CHOLHDL, LDLDIRECT in the last 72 hours. Thyroid Function Tests: No results for input(s): TSH, T4TOTAL, FREET4, T3FREE, THYROIDAB in the last 72 hours. Anemia Panel: No results for input(s): VITAMINB12, FOLATE, FERRITIN, TIBC, IRON, RETICCTPCT in the last 72 hours.    Radiology Studies: I have reviewed all of the imaging during this hospital visit personally     Scheduled Meds: . aspirin  81 mg Oral Daily  . atorvastatin  80 mg Oral QPM  . enoxaparin (LOVENOX) injection  40 mg Subcutaneous Q24H  . insulin aspart  0-15 Units Subcutaneous TID WC  . insulin aspart  0-5 Units Subcutaneous QHS  . insulin glargine  20 Units Subcutaneous Daily  . levothyroxine  25 mcg Oral QAC breakfast  . metoprolol tartrate  25 mg Oral BID   Continuous Infusions: . sodium chloride 100 mL/hr at 01/11/20 0847     LOS: 0 days        Keni Elison Gerome Apley, MD

## 2020-01-11 NOTE — Progress Notes (Signed)
Dr. Laurence Ferrari from interventional radiology has reviewed the images, lesion with high vascularity and high risk for biopsy.  Recommendations to follow-up first with gynecology/oncology.

## 2020-01-12 ENCOUNTER — Telehealth: Payer: Self-pay | Admitting: *Deleted

## 2020-01-12 DIAGNOSIS — Z01818 Encounter for other preprocedural examination: Secondary | ICD-10-CM

## 2020-01-12 DIAGNOSIS — E785 Hyperlipidemia, unspecified: Secondary | ICD-10-CM

## 2020-01-12 DIAGNOSIS — E11319 Type 2 diabetes mellitus with unspecified diabetic retinopathy without macular edema: Secondary | ICD-10-CM

## 2020-01-12 DIAGNOSIS — I10 Essential (primary) hypertension: Secondary | ICD-10-CM

## 2020-01-12 DIAGNOSIS — R1900 Intra-abdominal and pelvic swelling, mass and lump, unspecified site: Secondary | ICD-10-CM | POA: Diagnosis not present

## 2020-01-12 DIAGNOSIS — I25118 Atherosclerotic heart disease of native coronary artery with other forms of angina pectoris: Secondary | ICD-10-CM

## 2020-01-12 DIAGNOSIS — Z720 Tobacco use: Secondary | ICD-10-CM

## 2020-01-12 DIAGNOSIS — Z955 Presence of coronary angioplasty implant and graft: Secondary | ICD-10-CM

## 2020-01-12 LAB — BASIC METABOLIC PANEL
Anion gap: 7 (ref 5–15)
BUN: 15 mg/dL (ref 8–23)
CO2: 22 mmol/L (ref 22–32)
Calcium: 7.9 mg/dL — ABNORMAL LOW (ref 8.9–10.3)
Chloride: 108 mmol/L (ref 98–111)
Creatinine, Ser: 0.77 mg/dL (ref 0.44–1.00)
GFR calc Af Amer: >60 mL/min (ref >60)
GFR calc non Af Amer: >60 mL/min (ref >60)
Glucose, Bld: 191 mg/dL — ABNORMAL HIGH (ref 70–99)
Potassium: 4.4 mmol/L (ref 3.5–5.1)
Sodium: 137 mmol/L (ref 135–145)

## 2020-01-12 LAB — CBC WITH DIFFERENTIAL/PLATELET
Abs Immature Granulocytes: 0.07 10*3/uL (ref 0.00–0.07)
Basophils Absolute: 0.1 10*3/uL (ref 0.0–0.1)
Basophils Relative: 1 %
Eosinophils Absolute: 0.1 10*3/uL (ref 0.0–0.5)
Eosinophils Relative: 1 %
HCT: 30.2 % — ABNORMAL LOW (ref 36.0–46.0)
Hemoglobin: 9.2 g/dL — ABNORMAL LOW (ref 12.0–15.0)
Immature Granulocytes: 1 %
Lymphocytes Relative: 14 %
Lymphs Abs: 1.4 10*3/uL (ref 0.7–4.0)
MCH: 29.9 pg (ref 26.0–34.0)
MCHC: 30.5 g/dL (ref 30.0–36.0)
MCV: 98.1 fL (ref 80.0–100.0)
Monocytes Absolute: 0.6 10*3/uL (ref 0.1–1.0)
Monocytes Relative: 6 %
Neutro Abs: 8.1 10*3/uL — ABNORMAL HIGH (ref 1.7–7.7)
Neutrophils Relative %: 77 %
Platelets: 362 10*3/uL (ref 150–400)
RBC: 3.08 MIL/uL — ABNORMAL LOW (ref 3.87–5.11)
RDW: 12.5 % (ref 11.5–15.5)
WBC: 10.4 10*3/uL (ref 4.0–10.5)
nRBC: 0 % (ref 0.0–0.2)

## 2020-01-12 LAB — GLUCOSE, CAPILLARY
Glucose-Capillary: 44 mg/dL — CL (ref 70–99)
Glucose-Capillary: 79 mg/dL (ref 70–99)
Glucose-Capillary: 164 mg/dL — ABNORMAL HIGH (ref 70–99)
Glucose-Capillary: 163 mg/dL — ABNORMAL HIGH (ref 70–99)
Glucose-Capillary: 115 mg/dL — ABNORMAL HIGH (ref 70–99)

## 2020-01-12 LAB — URIC ACID: Uric Acid, Serum: 7.3 mg/dL — ABNORMAL HIGH (ref 2.5–7.1)

## 2020-01-12 MED ORDER — ACETAMINOPHEN 325 MG PO TABS: 650.0000 mg | ORAL_TABLET | Freq: Four times a day (QID) | ORAL | 0 refills | Status: AC

## 2020-01-12 MED ORDER — TRESIBA FLEXTOUCH 100 UNIT/ML ~~LOC~~ SOPN: 10.0000 [IU] | PEN_INJECTOR | Freq: Every morning | SUBCUTANEOUS | 0 refills | Status: AC

## 2020-01-12 MED ORDER — CHLORTHALIDONE 25 MG PO TABS: 25.0000 mg | ORAL_TABLET | Freq: Every day | ORAL | 0 refills | Status: DC

## 2020-01-12 MED ORDER — ALLOPURINOL 100 MG PO TABS: 100.0000 mg | ORAL_TABLET | Freq: Every day | ORAL | 0 refills | Status: DC

## 2020-01-12 MED ORDER — SODIUM ZIRCONIUM CYCLOSILICATE 10 G PO PACK
10.0000 g | PACK | Freq: Once | ORAL | Status: AC
  Administered 2020-01-12: 10 g via ORAL
  Filled 2020-01-12: qty 1

## 2020-01-12 NOTE — Final Consult Note (Signed)
Reason for Consult: Pelvic mass Referring Physician: Hospitalist service  Lisa Jenkins is an 69 y.o. female. She was admitted for pelvic pain and CT has been obtained which shows a large pelvic mass. She was readmitted over the weekend, pain has resolved. CA-125 is intermediately elevated at 80  Pertinent Gynecological History: Menses: post-menopausal Bleeding:  Contraception: post menopausal status DES exposure: denies Blood transfusions: none Sexually transmitted diseases: no past history Previous GYN Procedures:   Last mammogram:  Date:  Last pap:  Date:  OB History: G0, P0   Menstrual History: Menarche age:  No LMP recorded. Patient is postmenopausal.    Past Medical History:  Diagnosis Date  . Coronary artery disease    a. s/p NSTEMI in 07/2017 with DES to mid-RCA and residual disease along proximal-mid LAD  . Diabetes mellitus without complication ([place])   . High cholesterol   . Hyperkalemia   . Hypertension   . Myocardial infarction [facility])     Past Surgical History:  Procedure Laterality Date  . BUNIONECTOMY Bilateral   . CORONARY STENT INTERVENTION N/A 07/09/2017   Procedure: CORONARY STENT INTERVENTION;  Surgeon: Belva Crome, MD;  Location: [place] [facility];  Service: Cardiovascular;  Laterality: N/A;  . LEFT HEART CATH AND CORONARY ANGIOGRAPHY N/A 07/09/2017   Procedure: LEFT HEART CATH AND CORONARY ANGIOGRAPHY;  Surgeon: Belva Crome, MD;  Location: [place] [facility];  Service: Cardiovascular;  Laterality: N/A;    Family History  Problem Relation Age of Onset  . Diabetes Mother   . Heart disease Mother   . Heart disease Father   . Diabetes Sister   . Heart disease Sister   . Diabetes Brother   . Heart disease Brother   . Diabetes Brother   . Heart disease Brother   . Diabetes Brother   . Heart disease Brother   . Diabetes Brother   . Heart disease Brother   . Diabetes Brother   . Heart disease Brother     Social History:  reports that she  has been smoking cigarettes. She has a 11.50 pack-year smoking history. She has never used smokeless tobacco. She reports that she does not drink alcohol or use drugs.  Allergies: No Known Allergies  Medications: I have reviewed the patient's current medications.  Review of Systems  Blood pressure (!) 136/46, pulse 64, temperature 97.8 F (36.6 C), temperature source Oral, resp. rate 16, height 5\' 3"  (1.6 m), weight 65.3 kg, SpO2 98 %. Physical Exam  Results for orders placed or performed during the hospital encounter of 01/10/20 (from the past 48 hour(s))  CBC with Differential     Status: Abnormal   Collection Time: 01/10/20  3:57 PM  Result Value Ref Range   WBC 14.1 (H) 4.0 - 10.5 K/uL   RBC 3.53 (L) 3.87 - 5.11 MIL/uL   Hemoglobin 10.9 (L) 12.0 - 15.0 g/dL   HCT 34.6 (L) 36.0 - 46.0 %   MCV 98.0 80.0 - 100.0 fL   MCH 30.9 26.0 - 34.0 pg   MCHC 31.5 30.0 - 36.0 g/dL   RDW 12.7 11.5 - 15.5 %   Platelets 408 (H) 150 - 400 K/uL   nRBC 0.0 0.0 - 0.2 %   Neutrophils Relative % 78 %   Neutro Abs 11.1 (H) 1.7 - 7.7 K/uL   Lymphocytes Relative 13 %   Lymphs Abs 1.9 0.7 - 4.0 K/uL   Monocytes Relative 6 %   Monocytes Absolute 0.9 0.1 -  1.0 K/uL   Eosinophils Relative 1 %   Eosinophils Absolute 0.1 0.0 - 0.5 K/uL   Basophils Relative 1 %   Basophils Absolute 0.1 0.0 - 0.1 K/uL   Immature Granulocytes 1 %   Abs Immature Granulocytes 0.07 0.00 - 0.07 K/uL    Comment: Performed at [facility], [address], [place], [place] [address]  Lipase, blood     Status: Abnormal   Collection Time: 01/10/20  3:57 PM  Result Value Ref Range   Lipase 61 (H) 11 - 51 U/L    Comment: Performed at [facility], [address] [place], [place] [address]  Urinalysis, Routine w reflex microscopic     Status: Abnormal   Collection Time: 01/10/20  3:57 PM  Result Value Ref Range   Color, Urine STRAW (A) YELLOW   APPearance CLEAR CLEAR   Specific Gravity, Urine 1.004 (L) 1.005 - 1.030   pH 5.0  5.0 - 8.0   Glucose, UA NEGATIVE NEGATIVE mg/dL   Hgb urine dipstick NEGATIVE NEGATIVE   Bilirubin Urine NEGATIVE NEGATIVE   Ketones, ur NEGATIVE NEGATIVE mg/dL   Protein, ur 30 (A) NEGATIVE mg/dL   Nitrite NEGATIVE NEGATIVE   Leukocytes,Ua NEGATIVE NEGATIVE   WBC, UA 0-5 0 - 5 WBC/hpf   Bacteria, UA MANY (A) NONE SEEN   Squamous Epithelial / LPF 0-5 0 - 5    Comment: Performed at [facility], [address], [place], [place] [id number]  Basic metabolic panel     Status: Abnormal   Collection Time: 01/10/20  3:57 PM  Result Value Ref Range   Sodium 133 (L) 135 - 145 mmol/L   Potassium 5.9 (H) 3.5 - 5.1 mmol/L   Chloride 106 98 - 111 mmol/L   CO2 15 (L) 22 - 32 mmol/L   Glucose, Bld 277 (H) 70 - 99 mg/dL    Comment: Glucose reference range applies only to samples taken after fasting for at least 8 hours.   BUN 30 (H) 8 - 23 mg/dL   Creatinine, Ser 1.50 (H) 0.44 - 1.00 mg/dL   Calcium 8.6 (L) 8.9 - 10.3 mg/dL   GFR calc non Af Amer 36 (L) >60 mL/min   GFR calc Af Amer 42 (L) >60 mL/min   Anion gap 12 5 - 15    Comment: Performed at [facility], [address], [place], [place] [address]  Glucose, capillary     Status: Abnormal   Collection Time: 01/10/20  9:09 PM  Result Value Ref Range   Glucose-Capillary 220 (H) 70 - 99 mg/dL    Comment: Glucose reference range applies only to samples taken after fasting for at least 8 hours.  Basic metabolic panel     Status: Abnormal   Collection Time: 01/10/20 11:42 PM  Result Value Ref Range   Sodium 137 135 - 145 mmol/L   Potassium 4.8 3.5 - 5.1 mmol/L    Comment: DELTA CHECK NOTED   Chloride 110 98 - 111 mmol/L   CO2 20 (L) 22 - 32 mmol/L   Glucose, Bld 137 (H) 70 - 99 mg/dL    Comment: Glucose reference range applies only to samples taken after fasting for at least 8 hours.   BUN 29 (H) 8 - 23 mg/dL   Creatinine, Ser 1.08 (H) 0.44 - 1.00 mg/dL   Calcium 8.5 (L) 8.9 - 10.3 mg/dL   GFR calc non Af Amer 53 (L) >60 mL/min   GFR calc  Af Amer >60 >60 mL/min  Anion gap 7 5 - 15    Comment: Performed at [facility], [address] [place], [place] [address]  Hemoglobin A1c     Status: Abnormal   Collection Time: 01/10/20 11:42 PM  Result Value Ref Range   Hgb A1c MFr Bld 8.7 (H) 4.8 - 5.6 %    Comment: (NOTE) Pre diabetes:          5.7%-6.4% Diabetes:              >6.4% Glycemic control for   <7.0% adults with diabetes    Mean Plasma Glucose 202.99 mg/dL    Comment: Performed at [facility] [address] [place], [place] [address]  Uric acid     Status: Abnormal   Collection Time: 01/10/20 11:42 PM  Result Value Ref Range   Uric Acid, Serum 8.9 (H) 2.5 - 7.1 mg/dL    Comment: Performed at [facility], [address], [place], [place] [id number]  Basic metabolic panel     Status: Abnormal   Collection Time: 01/11/20  6:59 AM  Result Value Ref Range   Sodium 138 135 - 145 mmol/L   Potassium 5.1 3.5 - 5.1 mmol/L   Chloride 109 98 - 111 mmol/L   CO2 21 (L) 22 - 32 mmol/L   Glucose, Bld 130 (H) 70 - 99 mg/dL    Comment: Glucose reference range applies only to samples taken after fasting for at least 8 hours.   BUN 24 (H) 8 - 23 mg/dL   Creatinine, Ser 0.92 0.44 - 1.00 mg/dL   Calcium 8.6 (L) 8.9 - 10.3 mg/dL   GFR calc non Af Amer >60 >60 mL/min   GFR calc Af Amer >60 >60 mL/min   Anion gap 8 5 - 15    Comment: Performed at [facility], [address], [place], [place] [address]  CBC     Status: Abnormal   Collection Time: 01/11/20  6:59 AM  Result Value Ref Range   WBC 10.8 (H) 4.0 - 10.5 K/uL   RBC 3.22 (L) 3.87 - 5.11 MIL/uL   Hemoglobin 9.6 (L) 12.0 - 15.0 g/dL   HCT 31.1 (L) 36.0 - 46.0 %   MCV 96.6 80.0 - 100.0 fL   MCH 29.8 26.0 - 34.0 pg   MCHC 30.9 30.0 - 36.0 g/dL   RDW 12.5 11.5 - 15.5 %   Platelets 399 150 - 400 K/uL   nRBC 0.0 0.0 - 0.2 %    Comment: Performed at [facility], [address], [place], [place] [address]  Glucose, capillary     Status: Abnormal   Collection Time:  01/11/20  7:19 AM  Result Value Ref Range   Glucose-Capillary 121 (H) 70 - 99 mg/dL    Comment: Glucose reference range applies only to samples taken after fasting for at least 8 hours.  Glucose, capillary     Status: Abnormal   Collection Time: 01/11/20 11:18 AM  Result Value Ref Range   Glucose-Capillary 189 (H) 70 - 99 mg/dL    Comment: Glucose reference range applies only to samples taken after fasting for at least 8 hours.  Glucose, capillary     Status: Abnormal   Collection Time: 01/11/20  4:40 PM  Result Value Ref Range   Glucose-Capillary 59 (L) 70 - 99 mg/dL    Comment: Glucose reference range applies only to samples taken after fasting for at least 8 hours.  Glucose, capillary     Status: Abnormal  Collection Time: 01/11/20  5:30 PM  Result Value Ref Range   Glucose-Capillary 140 (H) 70 - 99 mg/dL    Comment: Glucose reference range applies only to samples taken after fasting for at least 8 hours.  Glucose, capillary     Status: Abnormal   Collection Time: 01/11/20  8:48 PM  Result Value Ref Range   Glucose-Capillary 312 (H) 70 - 99 mg/dL    Comment: Glucose reference range applies only to samples taken after fasting for at least 8 hours.  Glucose, capillary     Status: Abnormal   Collection Time: 01/12/20  1:00 AM  Result Value Ref Range   Glucose-Capillary 44 (LL) 70 - 99 mg/dL    Comment: Glucose reference range applies only to samples taken after fasting for at least 8 hours.   Comment 1 Notify RN   Glucose, capillary     Status: None   Collection Time: 01/12/20  1:30 AM  Result Value Ref Range   Glucose-Capillary 79 70 - 99 mg/dL    Comment: Glucose reference range applies only to samples taken after fasting for at least 8 hours.  Glucose, capillary     Status: Abnormal   Collection Time: 01/12/20  4:02 AM  Result Value Ref Range   Glucose-Capillary 164 (H) 70 - 99 mg/dL    Comment: Glucose reference range applies only to samples taken after fasting for at  least 8 hours.  Uric acid     Status: Abnormal   Collection Time: 01/12/20  5:29 AM  Result Value Ref Range   Uric Acid, Serum 7.3 (H) 2.5 - 7.1 mg/dL    Comment: Performed at [facility], [address], [place], [place] [id number]  Basic metabolic panel     Status: Abnormal   Collection Time: 01/12/20  5:29 AM  Result Value Ref Range   Sodium 137 135 - 145 mmol/L   Potassium 4.4 3.5 - 5.1 mmol/L   Chloride 108 98 - 111 mmol/L   CO2 22 22 - 32 mmol/L   Glucose, Bld 191 (H) 70 - 99 mg/dL    Comment: Glucose reference range applies only to samples taken after fasting for at least 8 hours.   BUN 15 8 - 23 mg/dL   Creatinine, Ser 0.77 0.44 - 1.00 mg/dL   Calcium 7.9 (L) 8.9 - 10.3 mg/dL   GFR calc non Af Amer >60 >60 mL/min   GFR calc Af Amer >60 >60 mL/min   Anion gap 7 5 - 15    Comment: Performed at [facility], [address] [place], [place] [address]  CBC with Differential/Platelet     Status: Abnormal   Collection Time: 01/12/20  5:29 AM  Result Value Ref Range   WBC 10.4 4.0 - 10.5 K/uL   RBC 3.08 (L) 3.87 - 5.11 MIL/uL   Hemoglobin 9.2 (L) 12.0 - 15.0 g/dL   HCT 30.2 (L) 36.0 - 46.0 %   MCV 98.1 80.0 - 100.0 fL   MCH 29.9 26.0 - 34.0 pg   MCHC 30.5 30.0 - 36.0 g/dL   RDW 12.5 11.5 - 15.5 %   Platelets 362 150 - 400 K/uL   nRBC 0.0 0.0 - 0.2 %   Neutrophils Relative % 77 %   Neutro Abs 8.1 (H) 1.7 - 7.7 K/uL   Lymphocytes Relative 14 %   Lymphs Abs 1.4 0.7 - 4.0 K/uL   Monocytes Relative 6 %   Monocytes Absolute 0.6 0.1 - 1.0 K/uL  Eosinophils Relative 1 %   Eosinophils Absolute 0.1 0.0 - 0.5 K/uL   Basophils Relative 1 %   Basophils Absolute 0.1 0.0 - 0.1 K/uL   Immature Granulocytes 1 %   Abs Immature Granulocytes 0.07 0.00 - 0.07 K/uL    Comment: Performed at [facility], [address], [place], [place] [address]  Glucose, capillary     Status: Abnormal   Collection Time: 01/12/20  7:23 AM  Result Value Ref Range   Glucose-Capillary 163 (H) 70 - 99 mg/dL     Comment: Glucose reference range applies only to samples taken after fasting for at least 8 hours.  Glucose, capillary     Status: Abnormal   Collection Time: 01/12/20 11:18 AM  Result Value Ref Range   Glucose-Capillary 115 (H) 70 - 99 mg/dL    Comment: Glucose reference range applies only to samples taken after fasting for at least 8 hours.    No results found.  Assessment/Plan: Pelvic mass, rule out GYN malignancy I have made referral arrangements to Dr. Everitt Amber who will see the patient this Wednesday.  Patient will be discharged home has transportation and arrangements are made for the appointment  Jonnie Kind 01/12/2020

## 2020-01-12 NOTE — Telephone Encounter (Signed)
Called and spoke with the patient. Scheduled a new patient for 5/12. Gave the patient the address and phone number for the clinic. Also gave the policy for visitors, parking and mask

## 2020-01-12 NOTE — Consult Note (Addendum)
Gyn Consult Note: brief note, full note to follow.: Telephone request for consult gratefully received.  Pleasant 69 yr old female known to this practice with large pelvic mass , of probable ovarian origin, with elevated Ca 125.  No Ascites on recent CT.  Medical history positive for STEMI and stent placement.  Pt pain control established and Pt being considered for discharge later today.  I will see at lunch.  Obviously pt will be a likely surgical candidate., likely by [facility]. I will reach out to Dr Denman George. In the interim, if cardiology evaluation in preparation for surgery clearance could be started before d/c, that would expedite management.  Full note to follow. Thank you  Mallory Shirk [phone] office [phone] cell.

## 2020-01-12 NOTE — Progress Notes (Signed)
PT Cancellation Note  Patient Details Name: Lisa Jenkins MRN: [id number] DOB: [birth year]   Cancelled Treatment:    Reason Eval/Treat Not Completed: PT screened, no needs identified, will sign off.  Patient ambulating independently in room per RN.   11:20 AM, 01/12/20 Lonell Grandchild, MPT Physical Therapist with [facility] 336 [phone] office [phone] mobile phone

## 2020-01-12 NOTE — Discharge Summary (Signed)
Physician Discharge Summary  Lisa Jenkins [id number] DOB: 1951/01/14 DOA: 01/10/2020  PCP: Denyce Robert, FNP  Admit date: 01/10/2020 Discharge date: 01/12/2020  Admitted From: home  Disposition:  Home   Recommendations for Outpatient Follow-up and new medication changes:  1. Follow up with Denyce Robert FNP in 7 days 2. Follow up with Dr Glo Herring from GYN on 01/14/20. 3. Basal insulin decreased to 10 units to prevent hypoglycemia.  4. Follow renal panel and uric acid on 01/14/20.  5. Started on allopurinol 100 mg daily.   Home Health: no   Equipment/Devices: no    Discharge Condition: stable  CODE STATUS: full  Diet recommendation: heart healthy   Brief/Interim Summary: Patient was admitted to the hospital with a working diagnosis of hyperkalemia and hyperuricemia.  69 year old female with a past coronary disease, type 2 diabetes mellitus, and hypertension.  Patient had a recent hospitalization 5/5-01/08/20 for hyperkalemia and a newly diagnosed pelvic mass.  At home patient had increased pelvic pain, refractive to oral analgesics, and she returned to the hospital 48 hours after discharge.  On her initial physical examination blood pressure 140/62, heart rate 73, temperature 98.2, respiratory rate 19, her lungs were clear to auscultation, heart S1-S2 present and rhythmic, her abdomen was soft and nontender, no lower extremity edema. Sodium 133, potassium 5.9, chloride 106, bicarb 15, glucose 277, BUN 30, creatinine 1.50, white count 14.1, hemoglobin 10.9, hematocrit 34.6, platelets 408.  Uric acid 8.9.  SARS COVID-19 negative.  EKG 74 bpm, left axis deviation, left anterior fascicular block, sinus rhythm, no ST segment or T wave changes.  IR evaluation, recommended GYN/Onc evaluation, IR biopsy with high risk due to highly vascular lesion.  1.  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  Patient was admitted to the telemetry ward,  she received intravenous calcium gluconate, intravenous insulin and IV isotonic saline plus oral sodium zirconium.  She was started on oral allopurinol with good toleration.  Her kidney function and electrolytes improved, at discharge sodium 137, potassium 4.1 chloride 108 bicarb 23, glucose 191, BUN 15, creatinine 0.77, calcium 7.7.  Uric acid 7.3.  Patient will receive 1 more dose of sodium zirconium before discharge.  Resume chlorthalidone for blood pressure control.  Continue allopurinol 100 mg daily.  She will need to follow-up kidney function and uric acid on May 12 at the time of her follow-up with GYN.  2.  Pelvic mass, large heterogeneous cystic and solid pelvic mass, 11 x 8.6 x 8.9 cm.  Intermediate risk for malignancy, she received analgesics with IV morphine and oral oxycodone, along with scheduled acetaminophen.  By the time of her discharge her pain has been well controlled. Case was reviewed IR, recommendations to check first with GYN/oncology before any cardiac procedure, mass with increased vascularity and high risk for complications. Patient will follow up with Dr Glo Herring on 01/14/20.  3.  Uncontrolled type 2 diabetes mellitus, hemoglobin A1c 7.9, dyslipidemia.  Patient had episode of hypoglycemia during hospitalization, I recommend her to decrease her basal insulin to 10 units, continue Metformin, Semaglutide and atorvastatin.  If glucose more than 200 she will resume 20 units daily, if less than 100 she will hold on basal insulin.  4.  Hypertension.  Continue blood pressure control with chlorthalidone and metoprolol.  5.  Hypothyroidism.  Continue levothyroxine.  Discharge Diagnoses:  Principal Problem:   Hyperkalemia Active Problems:   Essential hypertension, benign   Type 2 diabetes mellitus with retinopathy (HCC)   Hypothyroidism  Pelvic mass   Leukocytosis   AKI (acute kidney injury) [facility])    Discharge Instructions   Allergies as of 01/12/2020   No Known  Allergies     Medication List    TAKE these medications   acetaminophen 325 MG tablet Commonly known as: TYLENOL Take 2 tablets (650 mg total) by mouth every 6 (six) hours.   allopurinol 100 MG tablet Commonly known as: ZYLOPRIM Take 1 tablet (100 mg total) by mouth daily.   aspirin 81 MG chewable tablet Chew 81 mg by mouth daily.   atorvastatin 80 MG tablet Commonly known as: LIPITOR TAKE 1 Tablet BY MOUTH ONCE DAILY AT 6:00PM What changed:   how much to take  how to take this  when to take this   chlorthalidone 25 MG tablet Commonly known as: HYGROTON Take 1 tablet (25 mg total) by mouth daily.   metFORMIN 1000 MG tablet Commonly known as: GLUCOPHAGE TAKE 1 Tablet  BY MOUTH TWICE DAILY WITH MEALS   metoprolol tartrate 25 MG tablet Commonly known as: LOPRESSOR TAKE 1 Tablet  BY MOUTH TWICE DAILY   Ozempic (1 MG/DOSE) 2 MG/1.5ML Sopn Generic drug: Semaglutide (1 MG/DOSE) Inject 1 mg into the skin every Thursday.   Synthroid 25 MCG tablet Generic drug: levothyroxine TAKE 1 Tablet BY MOUTH ONCE DAILY BEFORE BREAKFAST What changed: See the new instructions.   Tyler Aas FlexTouch 100 UNIT/ML FlexTouch Pen Generic drug: insulin degludec Inject 0.1 mLs (10 Units total) into the skin every morning. What changed: how much to take   Vitamin D (Ergocalciferol) 1.25 MG (50000 UNIT) Caps capsule Commonly known as: DRISDOL Take 50,000 Units by mouth once a week.      Follow-up Information    Jonnie Kind, MD Follow up on 01/14/2020.   Specialties: Obstetrics and Gynecology, Radiology Contact information: [place] [address] [phone]          No Known Allergies  Consultations:  IR over the phone    Procedures/Studies: CT Abdomen Pelvis W Contrast  Result Date: 01/07/2020 CLINICAL DATA:  Abdominal pain EXAM: CT ABDOMEN AND PELVIS WITH CONTRAST TECHNIQUE: Multidetector CT imaging of the abdomen and pelvis was performed using  the standard protocol following bolus administration of intravenous contrast. CONTRAST:  136mL OMNIPAQUE IOHEXOL 300 MG/ML  SOLN COMPARISON:  MRI 07/29/2016, CT 07/28/2016 FINDINGS: Lower chest: Lung bases demonstrate no acute consolidation or effusion. Coronary vascular calcification. Hepatobiliary: No focal liver abnormality is seen. No gallstones, gallbladder wall thickening, or biliary dilatation. Pancreas: Unremarkable. No pancreatic ductal dilatation or surrounding inflammatory changes. Spleen: Normal in size without focal abnormality. Adrenals/Urinary Tract: 11 mm indeterminate right adrenal gland nodule. Grossly stable 2.9 cm left adrenal mass. Kidneys show no hydronephrosis. Small bilateral renal cysts. Indeterminate 12 mm exophytic hypodense lesion lower pole left kidney, series 2, image number 44. Indeterminate partially exophytic 9 mm hypodensity posterior cortex lower pole right kidney, series 2, image number 41. Urinary bladder is unremarkable. Intrarenal vascular calcifications. Probable small stones within both kidneys. Stomach/Bowel: Stomach is nonenlarged. No dilated small bowel. Negative appendix. No: Wall thickening. Vascular/Lymphatic: Extensive aortic atherosclerosis. No aneurysm. No suspicious pelvic adenopathy Reproductive: Interval development of large heterogeneous cystic and solid pelvic mass measuring 11 cm AP by 8.6 cm transverse by 8.9 cm craniocaudad. Other: No free air. No significant pelvic ascites Musculoskeletal: No acute or suspicious osseous abnormality IMPRESSION: 1. Interval development of large heterogeneous cystic and solid pelvic mass, uncertain if origin is uterus or ovaries. Mass  is indeterminate for gynecologic malignancy given new finding in postmenopausal patient since the previous exam. 2. 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 Indeterminate 11 mm right adrenal gland nodule, this could be evaluated with nonemergent adrenal CT. 3. Small bilateral renal cysts.  Indeterminate hypodense lesions in the lower poles of both kidneys. When the patient is clinically stable and able to follow directions and hold their breath (preferably as an outpatient) further evaluation with dedicated abdominal MRI should be considered. Aortic Atherosclerosis (ICD10-I70.0). Electronically Signed   By: Donavan Foil M.D.   On: 01/07/2020 21:27       Subjective: Patient with improved abdominal pain, no nausea or vomiting, no chest pain or dyspnea.   Discharge Exam: Vitals:   01/11/20 2039 01/12/20 0434  BP: (!) 139/52 (!) 136/46  Pulse: 65 64  Resp: 15 16  Temp: 98 F (36.7 C) 97.8 F (36.6 C)  SpO2: 97% 98%   Vitals:   01/11/20 1000 01/11/20 1327 01/11/20 2039 01/12/20 0434  BP: (!) 134/54 (!) 133/56 (!) 139/52 (!) 136/46  Pulse: 66 65 65 64  Resp: 18 17 15 16   Temp: 98.8 F (37.1 C) 98.8 F (37.1 C) 98 F (36.7 C) 97.8 F (36.6 C)  TempSrc: Oral Oral Oral Oral  SpO2: 98% 98% 97% 98%  Weight:      Height:        General: Not in pain or dyspnea.  Neurology: Awake and alert, non focal  E ENT: no pallor, no icterus, oral mucosa moist Cardiovascular: No JVD. S1-S2 present, rhythmic, no gallops, rubs, or murmurs. No lower extremity edema. Pulmonary: positive breath sounds bilaterally, adequate air movement, no wheezing, rhonchi or rales. Gastrointestinal. Abdomen with no organomegaly, non tender, no rebound or guarding Skin. No rashes Musculoskeletal: no joint deformities   The results of significant diagnostics from this hospitalization (including imaging, microbiology, ancillary and laboratory) are listed below for reference.     Microbiology: Recent Results (from the past 240 hour(s))  Respiratory Panel by RT PCR (Flu A&B, Covid) - Nasopharyngeal Swab     Status: None   Collection Time: 01/07/20 11:43 PM   Specimen: Nasopharyngeal Swab  Result Value Ref Range Status   SARS Coronavirus 2 by RT PCR NEGATIVE NEGATIVE Final    Comment:  (NOTE) SARS-CoV-2 target nucleic acids are NOT DETECTED. The SARS-CoV-2 RNA is generally detectable in upper respiratoy specimens during the acute phase of infection. The lowest concentration of SARS-CoV-2 viral copies this assay can detect is 131 copies/mL. A negative result does not preclude SARS-Cov-2 infection and should not be used as the sole basis for treatment or other patient management decisions. A negative result may occur with  improper specimen collection/handling, submission of specimen other than nasopharyngeal swab, presence of viral mutation(s) within the areas targeted by this assay, and inadequate number of viral copies (<131 copies/mL). A negative result must be combined with clinical observations, patient history, and epidemiological information. The expected result is Negative. Fact Sheet for Patients:  PinkCheek.be Fact Sheet for Healthcare Providers:  GravelBags.it This test is not yet ap proved or cleared by the [place] FDA and  has been authorized for detection and/or diagnosis of SARS-CoV-2 by FDA under an Emergency Use Authorization (EUA). This EUA will remain  in effect (meaning this test can be used) for the duration of the COVID-19 declaration under Section 564(b)(1) of the Act, 21 U.S.C. section 360bbb-3(b)(1), unless the authorization is terminated or revoked sooner.    Influenza A by  PCR NEGATIVE NEGATIVE Final   Influenza B by PCR NEGATIVE NEGATIVE Final    Comment: (NOTE) The Xpert Xpress SARS-CoV-2/FLU/RSV assay is intended as an aid in  the diagnosis of influenza from Nasopharyngeal swab specimens and  should not be used as a sole basis for treatment. Nasal washings and  aspirates are unacceptable for Xpert Xpress SARS-CoV-2/FLU/RSV  testing. Fact Sheet for Patients: PinkCheek.be Fact Sheet for Healthcare  Providers: GravelBags.it This test is not yet approved or cleared by the [place] FDA and  has been authorized for detection and/or diagnosis of SARS-CoV-2 by  FDA under an Emergency Use Authorization (EUA). This EUA will remain  in effect (meaning this test can be used) for the duration of the  Covid-19 declaration under Section 564(b)(1) of the Act, 21  U.S.C. section 360bbb-3(b)(1), unless the authorization is  terminated or revoked. Performed at [facility], [address], [place], [place] [address]      Labs: BNP (last 3 results) No results for input(s): BNP in the last 8760 hours. Basic Metabolic Panel: Recent Labs  Lab 01/07/20 1916 01/07/20 2037 01/08/20 0222 01/08/20 1225 01/08/20 1629 01/10/20 1557 01/10/20 2342 01/11/20 0659 01/12/20 0529  NA 134*   < > 136  --   --  133* 137 138 137  K >7.5*   < > 6.2*  6.2*   < > 5.2* 5.9* 4.8 5.1 4.4  CL 112*   < > 109  --   --  106 110 109 108  CO2 16*   < > 19*  --   --  15* 20* 21* 22  GLUCOSE 110*   < > 77  --   --  277* 137* 130* 191*  BUN 20   < > 18  --   --  30* 29* 24* 15  CREATININE 1.02*   < > 0.91  --   --  1.50* 1.08* 0.92 0.77  CALCIUM 9.3   < > 9.6  --   --  8.6* 8.5* 8.6* 7.9*  MG 1.3*  --   --   --   --   --   --   --   --    < > = values in this interval not displayed.   Liver Function Tests: Recent Labs  Lab 01/07/20 1916 01/08/20 0222  AST 10* 10*  ALT 9 10  ALKPHOS 124 119  BILITOT 0.4 0.4  PROT 7.0 6.6  ALBUMIN 3.3* 3.2*   Recent Labs  Lab 01/07/20 1916 01/10/20 1557  LIPASE 40 61*   No results for input(s): AMMONIA in the last 168 hours. CBC: Recent Labs  Lab 01/07/20 1916 01/08/20 0222 01/10/20 1557 01/11/20 0659 01/12/20 0529  WBC 14.2* 13.6* 14.1* 10.8* 10.4  NEUTROABS 10.6*  --  11.1*  --  8.1*  HGB 11.0* 10.7* 10.9* 9.6* 9.2*  HCT 35.4* 34.3* 34.6* 31.1* 30.2*  MCV 97.5 97.4 98.0 96.6 98.1  PLT 419* 391 408* 399 362   Cardiac  Enzymes: No results for input(s): CKTOTAL, CKMB, CKMBINDEX, TROPONINI in the last 168 hours. BNP: Invalid input(s): POCBNP CBG: Recent Labs  Lab 01/11/20 2048 01/12/20 0100 01/12/20 0130 01/12/20 0402 01/12/20 0723  GLUCAP 312* 44* 79 164* 163*   D-Dimer No results for input(s): DDIMER in the last 72 hours. Hgb A1c Recent Labs    01/10/20 2342  HGBA1C 8.7*   Lipid Profile No results for input(s): CHOL, HDL, LDLCALC, TRIG, CHOLHDL, LDLDIRECT in the last 72 hours. Thyroid  function studies No results for input(s): TSH, T4TOTAL, T3FREE, THYROIDAB in the last 72 hours.  Invalid input(s): FREET3 Anemia work up No results for input(s): VITAMINB12, FOLATE, FERRITIN, TIBC, IRON, RETICCTPCT in the last 72 hours. Urinalysis    Component Value Date/Time   COLORURINE STRAW (A) 01/10/2020 1557   APPEARANCEUR CLEAR 01/10/2020 1557   LABSPEC 1.004 (L) 01/10/2020 1557   PHURINE 5.0 01/10/2020 1557   GLUCOSEU NEGATIVE 01/10/2020 1557   HGBUR NEGATIVE 01/10/2020 1557   Dale 01/10/2020 1557   KETONESUR NEGATIVE 01/10/2020 1557   PROTEINUR 30 (A) 01/10/2020 1557   NITRITE NEGATIVE 01/10/2020 1557   LEUKOCYTESUR NEGATIVE 01/10/2020 1557   Sepsis Labs Invalid input(s): PROCALCITONIN,  WBC,  LACTICIDVEN Microbiology Recent Results (from the past 240 hour(s))  Respiratory Panel by RT PCR (Flu A&B, Covid) - Nasopharyngeal Swab     Status: None   Collection Time: 01/07/20 11:43 PM   Specimen: Nasopharyngeal Swab  Result Value Ref Range Status   SARS Coronavirus 2 by RT PCR NEGATIVE NEGATIVE Final    Comment: (NOTE) SARS-CoV-2 target nucleic acids are NOT DETECTED. The SARS-CoV-2 RNA is generally detectable in upper respiratoy specimens during the acute phase of infection. The lowest concentration of SARS-CoV-2 viral copies this assay can detect is 131 copies/mL. A negative result does not preclude SARS-Cov-2 infection and should not be used as the sole basis for  treatment or other patient management decisions. A negative result may occur with  improper specimen collection/handling, submission of specimen other than nasopharyngeal swab, presence of viral mutation(s) within the areas targeted by this assay, and inadequate number of viral copies (<131 copies/mL). A negative result must be combined with clinical observations, patient history, and epidemiological information. The expected result is Negative. Fact Sheet for Patients:  PinkCheek.be Fact Sheet for Healthcare Providers:  GravelBags.it This test is not yet ap proved or cleared by the [place] FDA and  has been authorized for detection and/or diagnosis of SARS-CoV-2 by FDA under an Emergency Use Authorization (EUA). This EUA will remain  in effect (meaning this test can be used) for the duration of the COVID-19 declaration under Section 564(b)(1) of the Act, 21 U.S.C. section 360bbb-3(b)(1), unless the authorization is terminated or revoked sooner.    Influenza A by PCR NEGATIVE NEGATIVE Final   Influenza B by PCR NEGATIVE NEGATIVE Final    Comment: (NOTE) The Xpert Xpress SARS-CoV-2/FLU/RSV assay is intended as an aid in  the diagnosis of influenza from Nasopharyngeal swab specimens and  should not be used as a sole basis for treatment. Nasal washings and  aspirates are unacceptable for Xpert Xpress SARS-CoV-2/FLU/RSV  testing. Fact Sheet for Patients: PinkCheek.be Fact Sheet for Healthcare Providers: GravelBags.it This test is not yet approved or cleared by the [place] FDA and  has been authorized for detection and/or diagnosis of SARS-CoV-2 by  FDA under an Emergency Use Authorization (EUA). This EUA will remain  in effect (meaning this test can be used) for the duration of the  Covid-19 declaration under Section 564(b)(1) of the Act, 21  U.S.C. section  360bbb-3(b)(1), unless the authorization is  terminated or revoked. Performed at [facility], [address], [place], [place] [address]      Time coordinating discharge: 45 minutes  SIGNED:   Tawni Millers, MD  [facility] 01/12/2020, 9:13 AM

## 2020-01-12 NOTE — Progress Notes (Signed)
CBG at 0100 - 44  Gave 8oz of juice and peanut butter crackers.   Repeat CBG at 0130 - 79

## 2020-01-12 NOTE — Progress Notes (Signed)
Inpatient Diabetes Program Recommendations  AACE/ADA: New Consensus Statement on Inpatient Glycemic Control (2015)  Target Ranges:  Prepandial:   less than 140 mg/dL      Peak postprandial:   less than 180 mg/dL (1-2 hours)      Critically ill patients:  140 - 180 mg/dL   Lab Results  Component Value Date   GLUCAP 163 (H) 01/12/2020   HGBA1C 8.7 (H) 01/10/2020    Review of Glycemic Control Results for Lisa Jenkins, Lisa Jenkins (MRN [id number]) as of 01/12/2020 10:45  Ref. Range 01/11/2020 20:48 01/12/2020 01:00 01/12/2020 01:30 01/12/2020 04:02 01/12/2020 07:23  Glucose-Capillary Latest Ref Range: 70 - 99 mg/dL 312 (H) 44 (LL) 79 164 (H) 163 (H)   Diabetes history: DM 2 Outpatient Diabetes medications:  Ozempic 1 mg weekly, Tresiba 20 units daily, Novolog moderate tid with meals and HS Current orders for Inpatient glycemic control:  Novolog moderate tid with meals and HS  Inpatient Diabetes Program Recommendations:    Patient received Lantus 20 units on 5/9 but this has been stopped.   May also consider reducing Novolog correction to sensitive tid with meals as patient has had lows after receiving meal time doses as well.   Thanks,  Adah Perl, RN, BC-ADM Inpatient Diabetes Coordinator Pager [phone] (8a-5p)

## 2020-01-12 NOTE — Consult Note (Signed)
Cardiology Consultation:   Patient ID: NEASIA GUIDROZ MRN: [id number]; DOB: 1951/08/26  Admit date: 01/10/2020 Date of Consult: 01/12/2020  Primary Care Provider: Denyce Robert, [place] Primary Cardiologist: Kate Sable, MD  Primary Electrophysiologist:  None    Patient Profile:   Lisa Jenkins is a 69 y.o. female with a hx of CAD who is being seen today for preoperative evaluation at the request of Dr. Glo Herring.  History of Present Illness:   Lisa Jenkins is a 69 year old female with a history of non-STEMI with drug-eluting stent placement to the mid RCA in November 2018. She has 50% disease in the proximal to mid LAD after the first diagonal. She also has a history of hypertension, hyperlipidemia, insulin-dependent diabetes mellitus, and tobacco use.  She was recently hospitalized for hyperkalemia in the context of a newly diagnosed pelvic mass.  She was discharged to home but then developed abdominal discomfort and presented to the ED 48 hours after discharge.  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  She was treated with IV calcium gluconate, IV insulin, and IV fluids.  She was started on allopurinol.  She was also given sodium zirconium.  Dr. Glo Herring evaluated her and felt she was likely a surgical candidate for what appears to be an ovarian mass.  He has requested preoperative evaluation by cardiology.  Prior to this recent diagnosis, she had been vacuuming, cleaning, washing the dishes, and doing a variety of household chores without any difficulties.  She specifically denied exertional chest pain as well as palpitations, orthopnea, leg swelling, and paroxysmal nocturnal dyspnea.  Labs performed today demonstrate normal renal function and hemoglobin of 9.2.  I personally reviewed the ECG performed on 01/10/2020 which demonstrated sinus rhythm and nonspecific ST segment abnormalities in high lateral  leads.   Past Medical History:  Diagnosis Date  . Coronary artery disease    a. s/p NSTEMI in 07/2017 with DES to mid-RCA and residual disease along proximal-mid LAD  . Diabetes mellitus without complication ([place])   . High cholesterol   . Hyperkalemia   . Hypertension   . Myocardial infarction [facility])     Past Surgical History:  Procedure Laterality Date  . BUNIONECTOMY Bilateral   . CORONARY STENT INTERVENTION N/A 07/09/2017   Procedure: CORONARY STENT INTERVENTION;  Surgeon: Belva Crome, MD;  Location: [place] [facility];  Service: Cardiovascular;  Laterality: N/A;  . LEFT HEART CATH AND CORONARY ANGIOGRAPHY N/A 07/09/2017   Procedure: LEFT HEART CATH AND CORONARY ANGIOGRAPHY;  Surgeon: Belva Crome, MD;  Location: [place] [facility];  Service: Cardiovascular;  Laterality: N/A;     Home Medications:  Prior to Admission medications   Medication Sig Start Date End Date Taking? Authorizing Provider  aspirin 81 MG chewable tablet Chew 81 mg by mouth daily.    Yes [provider]  atorvastatin (LIPITOR) 80 MG tablet TAKE 1 Tablet BY MOUTH ONCE DAILY AT 6:00PM Patient taking differently: Take 80 mg by mouth every evening. TAKE 1 Tablet BY MOUTH ONCE DAILY AT 6:00PM 02/05/18  Yes Soyla Dryer, PA-C  metFORMIN (GLUCOPHAGE) 1000 MG tablet TAKE 1 Tablet  BY MOUTH TWICE DAILY WITH MEALS Patient taking differently: Take 1,000 mg by mouth 2 (two) times daily with a meal.  04/28/18  Yes Soyla Dryer, PA-C  metoprolol tartrate (LOPRESSOR) 25 MG tablet TAKE 1 Tablet  BY MOUTH TWICE DAILY Patient taking differently: Take 25 mg by mouth 2 (two)  times daily.  04/28/18  Yes Soyla Dryer, PA-C  OZEMPIC, 1 MG/DOSE, 2 MG/1.5ML SOPN Inject 1 mg into the skin every Thursday. 12/18/19  Yes [provider]  SYNTHROID 25 MCG tablet TAKE 1 Tablet BY MOUTH ONCE DAILY BEFORE BREAKFAST Patient taking differently: Take 25 mcg by mouth daily before breakfast.  01/04/18  Yes Soyla Dryer, PA-C  Vitamin D, Ergocalciferol, (DRISDOL) 1.25 MG (50000 UNIT) CAPS capsule Take 50,000 Units by mouth once a week. 12/18/19  Yes [provider]  acetaminophen (TYLENOL) 325 MG tablet Take 2 tablets (650 mg total) by mouth every 6 (six) hours. 01/12/20   Arrien, Jimmy Picket, MD  allopurinol (ZYLOPRIM) 100 MG tablet Take 1 tablet (100 mg total) by mouth daily. 01/12/20 02/11/20  Arrien, Jimmy Picket, MD  chlorthalidone (HYGROTON) 25 MG tablet Take 1 tablet (25 mg total) by mouth daily. 01/12/20 04/11/20  Arrien, Jimmy Picket, MD  TRESIBA FLEXTOUCH 100 UNIT/ML FlexTouch Pen Inject 0.1 mLs (10 Units total) into the skin every morning. 01/12/20 02/11/20  Arrien, Jimmy Picket, MD    Inpatient Medications: Scheduled Meds: . acetaminophen  650 mg Oral Q6H  . allopurinol  100 mg Oral Daily  . aspirin  81 mg Oral Daily  . atorvastatin  80 mg Oral QPM  . enoxaparin (LOVENOX) injection  40 mg Subcutaneous Q24H  . insulin aspart  0-15 Units Subcutaneous TID WC  . insulin aspart  0-5 Units Subcutaneous QHS  . levothyroxine  25 mcg Oral QAC breakfast  . metoprolol tartrate  25 mg Oral BID   Continuous Infusions: . sodium chloride Stopped (01/12/20 1553)   PRN Meds: hydrocortisone cream, morphine injection, oxyCODONE  Allergies:   No Known Allergies  Social History:   Social History   Socioeconomic History  . Marital status: Single    Spouse name: Not on file  . Number of children: Not on file  . Years of education: Not on file  . Highest education level: Not on file  Occupational History  . Not on file  Tobacco Use  . Smoking status: Current Every Day Smoker    Packs/day: 0.25    Years: 46.00    Pack years: 11.50    Types: Cigarettes  . Smokeless tobacco: Never Used  Substance and Sexual Activity  . Alcohol use: No    Alcohol/week: 0.0 standard drinks  . Drug use: No  . Sexual activity: Not on file  Other Topics Concern  . Not on file  Social History  Narrative  . Not on file   Social Determinants of Health   Financial Resource Strain:   . Difficulty of Paying Living Expenses:   Food Insecurity:   . Worried About Charity fundraiser in the Last Year:   . Arboriculturist in the Last Year:   Transportation Needs:   . Film/video editor (Medical):   Marland Kitchen Lack of Transportation (Non-Medical):   Physical Activity:   . Days of Exercise per Week:   . Minutes of Exercise per Session:   Stress:   . Feeling of Stress :   Social Connections:   . Frequency of Communication with Friends and Family:   . Frequency of Social Gatherings with Friends and Family:   . Attends Religious Services:   . Active Member of Clubs or Organizations:   . Attends Archivist Meetings:   Marland Kitchen Marital Status:   Intimate Partner Violence:   . Fear of Current or Ex-Partner:   . Emotionally  Abused:   Marland Kitchen Physically Abused:   . Sexually Abused:     Family History:    Family History  Problem Relation Age of Onset  . Diabetes Mother   . Heart disease Mother   . Heart disease Father   . Diabetes Sister   . Heart disease Sister   . Diabetes Brother   . Heart disease Brother   . Diabetes Brother   . Heart disease Brother   . Diabetes Brother   . Heart disease Brother   . Diabetes Brother   . Heart disease Brother   . Diabetes Brother   . Heart disease Brother     Barbarann Ehlers, RN was present throughout the entirety of the encounter.  ROS:  Please see the history of present illness.   All other ROS reviewed and negative.     Physical Exam/Data:   Vitals:   01/11/20 1327 01/11/20 2039 01/12/20 0434 01/12/20 1411  BP: (!) 133/56 (!) 139/52 (!) 136/46 (!) 126/93  Pulse: 65 65 64 64  Resp: 17 15 16 18   Temp: 98.8 F (37.1 C) 98 F (36.7 C) 97.8 F (36.6 C) 97.9 F (36.6 C)  TempSrc: Oral Oral Oral Oral  SpO2: 98% 97% 98% 99%  Weight:      Height:        Intake/Output Summary (Last 24 hours) at 01/12/2020 1604 Last data filed  at 01/12/2020 1300 Gross per 24 hour  Intake 960 ml  Output --  Net 960 ml   Last 3 Weights 01/10/2020 01/08/2020 01/07/2020  Weight (lbs) 144 lb 137 lb 5.6 oz 144 lb  Weight (kg) 65.318 kg 62.3 kg 65.318 kg     Body mass index is 25.51 kg/m.  General:  Well nourished, well developed, in no acute distress HEENT: normal Lymph: no adenopathy Neck: no JVD Endocrine:  No thryomegaly Cardiac:  normal S1, S2; RRR; no murmur  Lungs:  clear to auscultation bilaterally, no wheezing, rhonchi or rales  Abd: soft, nontender, no hepatomegaly  Ext: no edema Musculoskeletal:  No deformities, BUE and BLE strength normal and equal Skin: warm and dry  Neuro:  CNs 2-12 intact, no focal abnormalities noted Psych:  Normal affect     Relevant CV Studies: Cardiac Catheterization: 07/2017  High-grade obstruction of the mid right coronary serving as the culprit for the patient's presentation with acute coronary syndrome.  Successful drug-eluting stent implantation in the mid right coronary decreasing tubular segmental 90% stenosis to 0% with TIMI grade III flow using a 3.0 x 28 mm Synergy post dilating to 3.5 mm in diameter.  Widely patent left main, and circumflex.  Eccentric 50% proximal to mid LAD after the first diagonal.  Overall normal LV function. EF estimated to be 55% with normal filling pressures.  RECOMMENDATIONS:  Aspirin and Plavix for 12 months.  Risk factor modification: Smoking cessation, aggressive lipid-lowering to LDL less than 70, blood pressure control, and screening for diabetes mellitus.  Eligible for discharge in a.m.  Laboratory Data:  High Sensitivity Troponin:  No results for input(s): TROPONINIHS in the last 720 hours.   Chemistry Recent Labs  Lab 01/10/20 2342 01/11/20 0659 01/12/20 0529  NA 137 138 137  K 4.8 5.1 4.4  CL 110 109 108  CO2 20* 21* 22  GLUCOSE 137* 130* 191*  BUN 29* 24* 15  CREATININE 1.08* 0.92 0.77  CALCIUM 8.5* 8.6* 7.9*  GFRNONAA  53* >60 >60  GFRAA >60 >60 >60  ANIONGAP 7 8 7  Recent Labs  Lab 01/07/20 1916 01/08/20 0222  PROT 7.0 6.6  ALBUMIN 3.3* 3.2*  AST 10* 10*  ALT 9 10  ALKPHOS 124 119  BILITOT 0.4 0.4   Hematology Recent Labs  Lab 01/10/20 1557 01/11/20 0659 01/12/20 0529  WBC 14.1* 10.8* 10.4  RBC 3.53* 3.22* 3.08*  HGB 10.9* 9.6* 9.2*  HCT 34.6* 31.1* 30.2*  MCV 98.0 96.6 98.1  MCH 30.9 29.8 29.9  MCHC 31.5 30.9 30.5  RDW 12.7 12.5 12.5  PLT 408* 399 362   BNPNo results for input(s): BNP, PROBNP in the last 168 hours.  DDimer No results for input(s): DDIMER in the last 168 hours.   Radiology/Studies:  No results found. {  CT abdomen and pelvis 01/07/2020:  IMPRESSION: 1. Interval development of large heterogeneous cystic and solid pelvic mass, uncertain if origin is uterus or ovaries. Mass is indeterminate for gynecologic malignancy given new finding in postmenopausal patient since the previous exam. 2. 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 Indeterminate 11 mm right adrenal gland nodule, this could be evaluated with nonemergent adrenal CT. 3. Small bilateral renal cysts. Indeterminate hypodense lesions in the lower poles of both kidneys. When the patient is clinically stable and able to follow directions and hold their breath (preferably as an outpatient) further evaluation with dedicated abdominal MRI should be considered.  Aortic Atherosclerosis (ICD10-I70.0).   Assessment and Plan:   1.  Preoperative risk stratification: She denies anginal pain with routine household activities.  She continues to remain stable from a cardiovascular standpoint.  I do not feel any preoperative cardiac testing is indicated at this time.  She can proceed with gynecologic surgery with acceptable level of risk.  Aspirin can be held 5 to 7 days prior to surgery.  Continue atorvastatin and metoprolol.  2.  Coronary artery disease: History of non-STEMI and drug-eluting stent  placement to the RCA.  Symptomatically stable on aspirin, atorvastatin and metoprolol.  3.  Hypertension: Blood pressure is normal.  No change to therapy.  4.  Hyperlipidemia: Continue atorvastatin 80 mg.  5.  Tobacco abuse: She needs complete cessation.  [facility] will sign off.   Medication Recommendations:  As above Other recommendations (labs, testing, etc):  None Follow up as an outpatient:  As previously scheduled  For questions or updates, please contact [place] Please consult [email or web address] for contact info under     Signed, Kate Sable, MD  01/12/2020 4:04 PM

## 2020-01-14 ENCOUNTER — Encounter (HOSPITAL_COMMUNITY): Payer: Self-pay | Admitting: Internal Medicine

## 2020-01-14 ENCOUNTER — Encounter: Payer: Medicare HMO | Admitting: Obstetrics and Gynecology

## 2020-01-14 ENCOUNTER — Encounter: Payer: Self-pay | Admitting: Gynecologic Oncology

## 2020-01-14 ENCOUNTER — Telehealth: Payer: Self-pay | Admitting: *Deleted

## 2020-01-14 ENCOUNTER — Inpatient Hospital Stay: Payer: Medicare HMO | Attending: Gynecologic Oncology | Admitting: Gynecologic Oncology

## 2020-01-14 ENCOUNTER — Other Ambulatory Visit: Payer: Self-pay

## 2020-01-14 VITALS — BP 143/65 | HR 65 | Temp 98.0°F | Resp 16 | Ht 63.0 in | Wt 138.0 lb

## 2020-01-14 DIAGNOSIS — E7800 Pure hypercholesterolemia, unspecified: Secondary | ICD-10-CM | POA: Insufficient documentation

## 2020-01-14 DIAGNOSIS — Z955 Presence of coronary angioplasty implant and graft: Secondary | ICD-10-CM | POA: Diagnosis not present

## 2020-01-14 DIAGNOSIS — I252 Old myocardial infarction: Secondary | ICD-10-CM | POA: Insufficient documentation

## 2020-01-14 DIAGNOSIS — E1122 Type 2 diabetes mellitus with diabetic chronic kidney disease: Secondary | ICD-10-CM | POA: Diagnosis not present

## 2020-01-14 DIAGNOSIS — Z7984 Long term (current) use of oral hypoglycemic drugs: Secondary | ICD-10-CM | POA: Diagnosis not present

## 2020-01-14 DIAGNOSIS — F17210 Nicotine dependence, cigarettes, uncomplicated: Secondary | ICD-10-CM | POA: Diagnosis not present

## 2020-01-14 DIAGNOSIS — R1900 Intra-abdominal and pelvic swelling, mass and lump, unspecified site: Secondary | ICD-10-CM | POA: Insufficient documentation

## 2020-01-14 DIAGNOSIS — E118 Type 2 diabetes mellitus with unspecified complications: Secondary | ICD-10-CM

## 2020-01-14 DIAGNOSIS — IMO0002 Reserved for concepts with insufficient information to code with codable children: Secondary | ICD-10-CM

## 2020-01-14 DIAGNOSIS — I10 Essential (primary) hypertension: Secondary | ICD-10-CM

## 2020-01-14 DIAGNOSIS — I2510 Atherosclerotic heart disease of native coronary artery without angina pectoris: Secondary | ICD-10-CM | POA: Insufficient documentation

## 2020-01-14 DIAGNOSIS — E875 Hyperkalemia: Secondary | ICD-10-CM | POA: Diagnosis not present

## 2020-01-14 DIAGNOSIS — I129 Hypertensive chronic kidney disease with stage 1 through stage 4 chronic kidney disease, or unspecified chronic kidney disease: Secondary | ICD-10-CM | POA: Diagnosis not present

## 2020-01-14 DIAGNOSIS — Z7982 Long term (current) use of aspirin: Secondary | ICD-10-CM | POA: Insufficient documentation

## 2020-01-14 DIAGNOSIS — N179 Acute kidney failure, unspecified: Secondary | ICD-10-CM

## 2020-01-14 DIAGNOSIS — Z79899 Other long term (current) drug therapy: Secondary | ICD-10-CM | POA: Diagnosis not present

## 2020-01-14 DIAGNOSIS — N189 Chronic kidney disease, unspecified: Secondary | ICD-10-CM | POA: Diagnosis not present

## 2020-01-14 DIAGNOSIS — E1165 Type 2 diabetes mellitus with hyperglycemia: Secondary | ICD-10-CM | POA: Diagnosis not present

## 2020-01-14 DIAGNOSIS — R971 Elevated cancer antigen 125 [CA 125]: Secondary | ICD-10-CM | POA: Diagnosis not present

## 2020-01-14 DIAGNOSIS — Z720 Tobacco use: Secondary | ICD-10-CM

## 2020-01-14 DIAGNOSIS — I25118 Atherosclerotic heart disease of native coronary artery with other forms of angina pectoris: Secondary | ICD-10-CM

## 2020-01-14 NOTE — Patient Instructions (Addendum)
For your diabetes:  Please avoid sodas, sweet foods (like cakes, cookies, chocolate, candy), sweet drinks (juice, sweet tea), fruits. Please avoid rice, potatoes, potato chips. Even though these are not sweet, they contain a lot of sugar.   Preparing for your Surgery  Plan for surgery on Jan 20, 2020 with Dr. Everitt Amber at [place] will be scheduled for a robotic assisted total laparoscopic hysterectomy, bilateral salpingo-oophorectomy, mini laparotomy, possible staging.   Plans will be for an overnight stay in the hospital.  Pre-operative Testing -You will receive a phone call from presurgical testing at [facility] to arrange for a pre-operative appointment over the phone, lab appointment, and COVID test. The COVID test normally happens 3 days prior to the surgery and they ask that you self quarantine after the test up until surgery to decrease chance of exposure.  -Bring your insurance card, copy of an advanced directive if applicable, medication list  -At that visit, you will be asked to sign a consent for a possible blood transfusion in case a transfusion becomes necessary during surgery.  The need for a blood transfusion is rare but having consent is a necessary part of your care.    -YOU CAN STAY ON YOUR BABY ASPIRIN (81) MG).  -Do not take supplements such as fish oil (omega 3), red yeast rice, turmeric before your surgery.   Day Before Surgery at [place] will be asked to take in a light diet the day before surgery.  Avoid carbonated beverages.  You will be advised you can have clear liquids (avoid items with high sugar content) after midnight and up until 3 hours before your surgery.    Eat a light diet the day before surgery.  Examples including soups, broths, toast, yogurt, mashed potatoes.  Things to avoid include carbonated beverages (fizzy beverages), raw fruits and raw vegetables, or beans.   If your bowels are filled with gas, your surgeon will  have difficulty visualizing your pelvic organs which increases your surgical risks.  Your role in recovery Your role is to become active as soon as directed by your doctor, while still giving yourself time to heal.  Rest when you feel tired. You will be asked to do the following in order to speed your recovery:  - Cough and breathe deeply. This helps to clear and expand your lungs and can prevent pneumonia after surgery.  - [place]. Do mild physical activity. Walking or moving your legs help your circulation and body functions return to normal. Do not try to get up or walk alone the first time after surgery.   -If you develop swelling on one leg or the other, pain in the back of your leg, redness/warmth in one of your legs, please call the office or go to the Emergency Room to have a doppler to rule out a blood clot. For shortness of breath, chest pain-seek care in the Emergency Room as soon as possible. - Actively manage your pain. Managing your pain lets you move in comfort. We will ask you to rate your pain on a scale of zero to 10. It is your responsibility to tell your doctor or nurse where and how much you hurt so your pain can be treated.  Special Considerations -If you are diabetic, you may be placed on insulin after surgery to have closer control over your blood sugars to promote healing and recovery.  This does not mean that you will be discharged  on insulin.  If applicable, your oral antidiabetics will be resumed when you are tolerating a solid diet.  -Your final pathology results from surgery should be available around one week after surgery and the results will be relayed to you when available.  -Dr. Lahoma Crocker is the surgeon that assists your GYN Oncologist with surgery.  If you end up staying the night, the next day after your surgery you will either see Dr. Denman George, Dr. Berline Lopes, or Dr. Lahoma Crocker.  -FMLA forms can be faxed to [phone] and please  allow 5-7 business days for completion.  Risks of Surgery Risks of surgery are low but include bleeding, infection, damage to surrounding structures, re-operation, blood clots, and very rarely death.  Blood Transfusion Information (For the consent to be signed before surgery)  We will be checking your blood type before surgery so in case of emergencies, we will know what type of blood you would need.                                            WHAT IS A BLOOD TRANSFUSION?  A transfusion is the replacement of blood or some of its parts. Blood is made up of multiple cells which provide different functions.  Red blood cells carry oxygen and are used for blood loss replacement.  White blood cells fight against infection.  Platelets control bleeding.  Plasma helps clot blood.  Other blood products are available for specialized needs, such as hemophilia or other clotting disorders. BEFORE THE TRANSFUSION  Who gives blood for transfusions?   You may be able to donate blood to be used at a later date on yourself (autologous donation).  Relatives can be asked to donate blood. This is generally not any safer than if you have received blood from a stranger. The same precautions are taken to ensure safety when a relative's blood is donated.  Healthy volunteers who are fully evaluated to make sure their blood is safe. This is blood bank blood. Transfusion therapy is the safest it has ever been in the practice of medicine. Before blood is taken from a donor, a complete history is taken to make sure that person has no history of diseases nor engages in risky social behavior (examples are intravenous drug use or sexual activity with multiple partners). The donor's travel history is screened to minimize risk of transmitting infections, such as malaria. The donated blood is tested for signs of infectious diseases, such as HIV and hepatitis. The blood is then tested to be sure it is compatible with you in  order to minimize the chance of a transfusion reaction. If you or a relative donates blood, this is often done in anticipation of surgery and is not appropriate for emergency situations. It takes many days to process the donated blood. RISKS AND COMPLICATIONS Although transfusion therapy is very safe and saves many lives, the main dangers of transfusion include:   Getting an infectious disease.  Developing a transfusion reaction. This is an allergic reaction to something in the blood you were given. Every precaution is taken to prevent this. The decision to have a blood transfusion has been considered carefully by your caregiver before blood is given. Blood is not given unless the benefits outweigh the risks.  AFTER SURGERY INSTRUCTIONS  Return to work: 4-6 weeks if applicable  Activity: 1. Be up and out of the  bed during the day.  Take a nap if needed.  You may walk up steps but be careful and use the hand rail.  Stair climbing will tire you more than you think, you may need to stop part way and rest.   2. No lifting or straining for 6 weeks over 10 pounds. No pushing, pulling, straining for 6 weeks.  3. No driving for 1-2 week(s) if you were cleared to drive before surgery.  Do not drive if you are taking narcotic pain medicine and make sure that your reaction time has returned.   4. You can shower as soon as the next day after surgery. Shower daily.  Use soap and water on your incision and pat dry; don't rub.  No tub baths or submerging your body in water until cleared by your surgeon. If you have the soap that was given to you by pre-surgical testing that was used before surgery, you do not need to use it afterwards because this can irritate your incisions.   5. No sexual activity and nothing in the vagina for 8 weeks.  6. You may experience a small amount of clear drainage from your incisions, which is normal.  If the drainage persists, increases, or changes color please call the  office.  7. Do not use creams, lotions, or ointments such as neosporin on your incisions after surgery until advised by your surgeon because they can cause removal of the dermabond glue on your incisions.    8. You may experience vaginal spotting after surgery or around the 6-8 week mark from surgery when the stitches at the top of the vagina begin to dissolve.  The spotting is normal but if you experience heavy bleeding, call our office.  9. Take Tylenol first for pain and only use narcotic pain medication for severe pain not relieved by the Tylenol.  Monitor your Tylenol intake to a max of 4,000 mg in a 24 hour period.  After surgery, you can take up to 1000 mg of Tylenol four times a day for pain.  Diet: 1. Low sodium Heart Healthy Low sugar Diet is recommended.  2. It is safe to use a laxative, such as Miralax or Colace, if you have difficulty moving your bowels. You can take Sennakot at bedtime every evening to keep bowel movements regular and to prevent constipation.    Wound Care: 1. Keep clean and dry.  Shower daily.  Reasons to call the Doctor:  Fever - Oral temperature greater than 100.4 degrees Fahrenheit  Foul-smelling vaginal discharge  Difficulty urinating  Nausea and vomiting  Increased pain at the site of the incision that is unrelieved with pain medicine.  Difficulty breathing with or without chest pain  New calf pain especially if only on one side  Sudden, continuing increased vaginal bleeding with or without clots.   Contacts: For questions or concerns you should contact:  Dr. Everitt Amber at [phone]  Joylene John, NP at [phone]  After Hours: call [phone] and have the GYN Oncologist paged/contacted

## 2020-01-14 NOTE — Telephone Encounter (Signed)
Hi,   I cleared Mrs. Lisa Jenkins for the [facility].  I faxed it. But the second fax confirmation did not show up. To be on the safe side, will you please send the clearance to them?   Thank you, KL

## 2020-01-14 NOTE — Telephone Encounter (Signed)
I have re-faxed clearance.

## 2020-01-14 NOTE — H&P (View-Only) (Signed)
Consult Note: Gyn-Onc  Consult was requested by Dr. Glo Herring for the evaluation of Lisa Jenkins 69 y.o. female  CC:  Chief Complaint  Patient presents with  . Pelvic mass    Assessment/Plan:  [name]  is a 69 y.o.  year old with an 11cm cystic and solid concerning appearing left pelvic mass (likely left ovarian origin).   I explained to the patient and her friend that I had some concern that this could be an ovarian malignancy.  I explained that some benign masses can also appear in the same manner and be associated with small elevations in tumor markers.  I explained that we will not know the underlying diagnosis until after surgical excision and pathology evaluation.  I am recommending proceeding with surgical removal given the possibility of malignancy and given the pain associated with this mass.  My surgical recommendation would be for robotic assisted total hysterectomy with BSO, mini laparotomy for specimen delivery, possible laparotomy, possible staging pending frozen section pathology results.  I am favoring attempting a robotic approach given the patient's multiple medical comorbidities which place her at substantially increased risk for complications of bleeding and infection, and smaller incisions and blood loss associated with a minimally invasive approach might mitigate some of these.  I explained to the patient and her friend that while we will attempt a minimally invasive approach, it cannot be guaranteed, and if during surgery is deemed to not be feasible to achieve an acceptable surgical outcome through minimally invasive approach, we will convert to a midline vertical incision to complete the procedure.  I explained the potential differences in recovery including hospitalization and risks associated with both approaches.  The patient has substantial medical comorbidities and these will need optimization preoperatively.  I am recommending that she stay on her 81 mg  aspirin dosage through surgery given her history of MI and stent placement.  We will confirm this with her cardiologist, Dr. Bronson Ing, and ensure that there is no other optimization that she requires preoperatively such as testing, medication alteration, or procedures.  Given her lack of cardiac symptoms, it is likely that we will be able to proceed with the procedure though I will defer to her cardiologist regarding that.  With respect to her poorly controlled diabetes and a hemoglobin A1c of 8.7, I explained this places her at substantially increased risk for perioperative complications particularly those related to wound healing and infection.  The patient seems somewhat unaware of how diet would influence her blood glucose, or even insulin for that matter asking if too much insulin can cause the hemoglobin A1c to be high.  I explained to her that even when on insulin, diet is critically important in regulating her blood glucose.  I provided her with verbal and written information regarding minimizing simple carbohydrates in her diet.  I discussed with her and her friend the critical importance of tight blood glucose control including frequent checking with her glucometer and strict compliance with her prescribed insulin regimen.  This is being found to be associated with decreased perioperative complications.  Given the concern for underlying malignancy in this patient and the pain associated with this mass, I do not feel that we have time to wait for alterations in her insulin regimen and reevaluation prior to surgery.  Therefore we will attempt to optimize which she is already on and speak with her primary care physician regarding ideal perioperative control of her blood sugar.  With respect to her CKD  likely associated with her hypertension, vascular disease, and poorly controlled diabetes, she appears to have brittle renal function demonstrating an event of AKI experienced after CT scan with IV  contrast.  Her creatinine normalized prior to discharge recently.  However this demonstrates that the patient is susceptible to nephrotoxic agents and we will avoid these perioperatively wherever possible.  I explained that surgical insult including hypotension can cause AKI postoperatively as can many other medications that are necessary around the time of surgery.  I explained that her underlying renal function potentially could be permanently negatively impacted by a surgical intervention, though obviously we will attempt to avoid this in any way potentially controllable.  Presently surgery is scheduled for Jan 20, 2020.  This is pending clearance from her primary care physician and cardiologist.  She understands that we may have to delay surgery if it is deemed to not be medically suitable at this time.  She was thoroughly counseled regarding the potential risks of surgery, how these are increased for her given her underlying comorbidities, and how we will attempt to mitigate these the cannot guarantee that she will not experience a perioperative complication.  HPI: [name] is a 69 year old P0 who was seen in consultation at the request of Dr Glo Herring for evaluation of a pelvic mass and elevated CA 125.  The patient reported a 1 month history of developing lower abdominal generalized pain.  This prompted her to be seen in the emergency department at [facility] on Jan 07, 2020.  At that time a CT scan of the abdomen and pelvis with IV contrast was ordered.  It demonstrated an interval development of an 11 x 8.6 x 8.9 cm heterogeneous cystic and solid pelvic mass predominantly on the left.  The CT scan was unable to identify whether this was uterine or ovarian in origin.  There was no associated ascites, peritoneal carcinomatosis, or lymphadenopathy.  Additional CT findings included a stable 2.9 cm left adrenal mass, and an exophytic lesion from the posterior lower pole of the right kidney.   There was an additional 11 mm right adrenal gland nodule identified. Ca1 25 was drawn on 01/08/2020 and was elevated at 87.7.  During that hospitalization she experienced acute kidney injury, possibly secondary to administration of IV contrast.  This was associated with hyperkalemia.  This was treated with supportive medical care including IV hydration and her condition returned to normal within 4 days.  The patient's medical history is remarkable for type 2 diabetes mellitus which is poorly controlled.  Hemoglobin A1c during the month of May 2021 was 8.7.  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  Her primary care physician Dr. [name] manages her blood glucose and insulin regimen.  The patient has a coronary vascular disease history of a myocardial infarction at age 44, July 09, 2017.  This was managed at [facility] with coronary angiogram and placement of a stent.  She was never placed on Plavix post procedure however has been on aspirin since that time (81 mg).  Dr. Tamala Julian performed her coronary angioplasty in 2018.  Dr. Bronson Ing is her cardiologist in [place].  She saw her her cardiologist for routine visit in October 2020 through a virtual visit.  She reported no concerning symptoms including no shortness of breath, no chest pain, no orthopnea.  No interventions were necessary after that visit.  It is noted that at the time of  her MI in 2018 she underwent an echocardiogram which showed an ejection fraction of 55%.  She has chronic kidney disease as outlined above.  While her creatinine is within normal range at rest, she experienced AKI after administration of IV contrast in May 2021.  Past surgical history is unremarkable with no prior abdominal surgeries.  She has never been pregnant.  She has been married twice.  She has had Pap smears during her reproductive life with no abnormal Pap tests.  She  experienced menopause in her 18s.  She denies postmenopausal bleeding.  The patient has a history of tobacco use.  She lives with her sister-in-law.  She does not work outside of the home.  She is of excellent functional status and is ambulatory through the day without assistance.  She is able to climb a flight of stairs without shortness of breath or weakness.  She is independent with activities of daily living.  Current Meds:  Outpatient Encounter Medications as of 01/14/2020  Medication Sig  . acetaminophen (TYLENOL) 325 MG tablet Take 2 tablets (650 mg total) by mouth every 6 (six) hours.  Marland Kitchen allopurinol (ZYLOPRIM) 100 MG tablet Take 1 tablet (100 mg total) by mouth daily.  Marland Kitchen aspirin 81 MG chewable tablet Chew 81 mg by mouth daily.   Marland Kitchen atorvastatin (LIPITOR) 80 MG tablet TAKE 1 Tablet BY MOUTH ONCE DAILY AT 6:00PM (Patient taking differently: Take 80 mg by mouth every evening. TAKE 1 Tablet BY MOUTH ONCE DAILY AT 6:00PM)  . chlorthalidone (HYGROTON) 25 MG tablet Take 1 tablet (25 mg total) by mouth daily.  . metFORMIN (GLUCOPHAGE) 1000 MG tablet TAKE 1 Tablet  BY MOUTH TWICE DAILY WITH MEALS (Patient taking differently: Take 1,000 mg by mouth 2 (two) times daily with a meal. )  . metoprolol tartrate (LOPRESSOR) 25 MG tablet TAKE 1 Tablet  BY MOUTH TWICE DAILY (Patient taking differently: Take 25 mg by mouth 2 (two) times daily. )  . OZEMPIC, 1 MG/DOSE, 2 MG/1.5ML SOPN Inject 1 mg into the skin every Thursday.  Marland Kitchen SYNTHROID 25 MCG tablet TAKE 1 Tablet BY MOUTH ONCE DAILY BEFORE BREAKFAST (Patient taking differently: Take 25 mcg by mouth daily before breakfast. )  . TRESIBA FLEXTOUCH 100 UNIT/ML FlexTouch Pen Inject 0.1 mLs (10 Units total) into the skin every morning.  . Vitamin D, Ergocalciferol, (DRISDOL) 1.25 MG (50000 UNIT) CAPS capsule Take 50,000 Units by mouth once a week.   No facility-administered encounter medications on file as of 01/14/2020.    Allergy: No Known Allergies  Social  Hx:   Social History   Socioeconomic History  . Marital status: Single    Spouse name: Not on file  . Number of children: Not on file  . Years of education: Not on file  . Highest education level: Not on file  Occupational History  . Not on file  Tobacco Use  . Smoking status: Current Every Day Smoker    Packs/day: 0.25    Years: 46.00    Pack years: 11.50    Types: Cigarettes  . Smokeless tobacco: Never Used  Substance and Sexual Activity  . Alcohol use: No    Alcohol/week: 0.0 standard drinks  . Drug use: No  . Sexual activity: Not Currently  Other Topics Concern  . Not on file  Social History Narrative  . Not on file   Social Determinants of Health   Financial Resource Strain:   . Difficulty of Paying Living Expenses:   Food Insecurity:   .  Worried About Charity fundraiser in the Last Year:   . Arboriculturist in the Last Year:   Transportation Needs:   . Film/video editor (Medical):   Marland Kitchen Lack of Transportation (Non-Medical):   Physical Activity:   . Days of Exercise per Week:   . Minutes of Exercise per Session:   Stress:   . Feeling of Stress :   Social Connections:   . Frequency of Communication with Friends and Family:   . Frequency of Social Gatherings with Friends and Family:   . Attends Religious Services:   . Active Member of Clubs or Organizations:   . Attends Archivist Meetings:   Marland Kitchen Marital Status:   Intimate Partner Violence:   . Fear of Current or Ex-Partner:   . Emotionally Abused:   Marland Kitchen Physically Abused:   . Sexually Abused:     Past Surgical Hx:  Past Surgical History:  Procedure Laterality Date  . BUNIONECTOMY Bilateral   . CORONARY STENT INTERVENTION N/A 07/09/2017   Procedure: CORONARY STENT INTERVENTION;  Surgeon: Belva Crome, MD;  Location: [facility];  Service: Cardiovascular;  Laterality: N/A;  . LEFT HEART CATH AND CORONARY ANGIOGRAPHY N/A 07/09/2017   Procedure: LEFT HEART CATH AND CORONARY ANGIOGRAPHY;   Surgeon: Belva Crome, MD;  Location: [facility];  Service: Cardiovascular;  Laterality: N/A;    Past Medical Hx:  Past Medical History:  Diagnosis Date  . Coronary artery disease    a. s/p NSTEMI in 07/2017 with DES to mid-RCA and residual disease along proximal-mid LAD  . Diabetes mellitus without complication ([place])   . High cholesterol   . Hyperkalemia   . Hypertension   . Myocardial infarction [facility])     Past Gynecological History:  See HPI No LMP recorded. Patient is postmenopausal.  Family Hx:  Family History  Problem Relation Age of Onset  . Diabetes Mother   . Heart disease Mother   . Heart disease Father   . Diabetes Sister   . Heart disease Sister   . Diabetes Brother   . Heart disease Brother   . Diabetes Brother   . Heart disease Brother   . Diabetes Brother   . Heart disease Brother   . Diabetes Brother   . Heart disease Brother   . Diabetes Brother   . Heart disease Brother   . Breast cancer Neg Hx   . Ovarian cancer Neg Hx   . Endometrial cancer Neg Hx   . Colon cancer Neg Hx     Review of Systems:  Constitutional  Feels fatigued  ENT Normal appearing ears and nares bilaterally Skin/Breast  No rash, sores, jaundice, itching, dryness Cardiovascular  No chest pain, shortness of breath, or edema  Pulmonary  No cough or wheeze.  Gastro Intestinal  No nausea, vomitting, or diarrhoea. + abdo pain, no change in bowel habit Genito Urinary  No frequency, urgency, dysuria, no bleeding Musculo Skeletal  No myalgia, arthralgia, joint swelling or pain  Neurologic  No weakness, numbness, change in gait,  Psychology  No depression, anxiety, insomnia.   Vitals:  Blood pressure (!) 143/65, pulse 65, temperature 98 F (36.7 C), temperature source Temporal, resp. rate 16, height 5\' 3"  (1.6 m), weight 138 lb (62.6 kg), SpO2 100 %.  Physical Exam: WD in NAD Neck  Supple NROM, without any enlargements.  Lymph Node Survey No cervical  supraclavicular or inguinal adenopathy Cardiovascular  Pulse normal rate, regularity  and rhythm. S1 and S2 normal.  Lungs  Clear to auscultation bilateraly, without wheezes/crackles/rhonchi. Good air movement.  Skin  No rash/lesions/breakdown. Echymoses on arms from recent hospitalization.  Psychiatry  Alert and oriented to person, place, and time  Abdomen  Normoactive bowel sounds, abdomen soft, non-tender and nonobese without evidence of hernia.  Back No CVA tenderness Genito Urinary  Vulva/vagina: Normal external female genitalia.  No lesions. No discharge or bleeding.  Bladder/urethra:  No lesions or masses, well supported bladder  Vagina: atrophic, narrow, normal, no lesions  Cervix: Normal appearing, no lesions. Displaced anteriorally.  Uterus:  Small, mobile, no parametrial involvement or nodularity. Feels anterior to the mass.  Adnexa: Large, nodular, minimally mobile pelvic mass filling upper pelvis and extending posteriorally. Rectal  Good tone, mass appreciated, however not obviously adherent to the rectum and no cul de sac nodularity present. Extremities  No bilateral cyanosis, clubbing or edema.   Thereasa Solo, MD  01/14/2020, 12:49 PM

## 2020-01-14 NOTE — Telephone Encounter (Signed)
       Primary Cardiologist: Kate Sable, MD  Chart reviewed as part of pre-operative protocol coverage. Given past medical history and time since last visit, based on ACC/AHA guidelines, THEARSA GRANGER would be at acceptable risk for the planned procedure without further cardiovascular testing.   Per Dr. Bronson Ing 01/12/2020 Preoperative risk stratification: She denies anginal pain with routine household activities.  She continues to remain stable from a cardiovascular standpoint.  I do not feel any preoperative cardiac testing is indicated at this time.  She can proceed with gynecologic surgery with acceptable level of risk.  Aspirin can be held 5 to 7 days prior to surgery.  Continue atorvastatin and metoprolol. Its okay to continue ASA peri-operatively.   I will route this recommendation to the requesting party via Epic fax function and remove from pre-op pool.  Please call with questions.  Phill Myron. West Pugh, ANP, AACC  01/14/2020, 4:30 PM

## 2020-01-14 NOTE — Telephone Encounter (Signed)
   [facility] Pre-operative Risk Assessment    Request for surgical clearance:  1. What type of surgery is being performed? ROBOTIC ASSISTED TOTAL HYSTERECTOMY WITH BSO, MINI LAPAROTOMY    2. When is this surgery scheduled? 01/20/20   3. What type of clearance is required (medical clearance vs. Pharmacy clearance to hold med vs. Both)? MEDICAL  4. Are there any medications that need to be held prior to surgery and how long? PT IS ON ASA HOWEVER: DR. EMMA ROSSI STATES SHE RECOMMENDS THAT THE PT STAY ON ASA 81 MG THROUGH SURGERY GIVEN HER H/O MI AND STENT PLACEMENT; WOULD LIKE FOR CONFIRMATION FROM CARDIOLOGIST THAT IT WILL BE OK TO CONTINUE ASA THROUGH SURGERY   5. Practice name and name of physician performing surgery? [facility]; DR. Terrence Dupont ROSSI   6. What is your office phone number [phone]   7.   What is your office fax number [phone]  8.   Anesthesia type (None, local, MAC, general) ? NOT LISTED; GENERAL?   Julaine Hua 01/14/2020, 4:02 PM  _________________________________________________________________   (provider comments below)

## 2020-01-14 NOTE — Progress Notes (Signed)
Consult Note: Gyn-Onc  Consult was requested by Dr. Glo Herring for the evaluation of Lisa Jenkins 69 y.o. female  CC:  Chief Complaint  Patient presents with  . Pelvic mass    Assessment/Plan:  Lisa Jenkins  is a 69 y.o.  year old with an 11cm cystic and solid concerning appearing left pelvic mass (likely left ovarian origin).   I explained to the patient and her friend that I had some concern that this could be an ovarian malignancy.  I explained that some benign masses can also appear in the same manner and be associated with small elevations in tumor markers.  I explained that we will not know the underlying diagnosis until after surgical excision and pathology evaluation.  I am recommending proceeding with surgical removal given the possibility of malignancy and given the pain associated with this mass.  My surgical recommendation would be for robotic assisted total hysterectomy with BSO, mini laparotomy for specimen delivery, possible laparotomy, possible staging pending frozen section pathology results.  I am favoring attempting a robotic approach given the patient's multiple medical comorbidities which place her at substantially increased risk for complications of bleeding and infection, and smaller incisions and blood loss associated with a minimally invasive approach might mitigate some of these.  I explained to the patient and her friend that while we will attempt a minimally invasive approach, it cannot be guaranteed, and if during surgery is deemed to not be feasible to achieve an acceptable surgical outcome through minimally invasive approach, we will convert to a midline vertical incision to complete the procedure.  I explained the potential differences in recovery including hospitalization and risks associated with both approaches.  The patient has substantial medical comorbidities and these will need optimization preoperatively.  I am recommending that she stay on her 81 mg  aspirin dosage through surgery given her history of MI and stent placement.  We will confirm this with her cardiologist, Dr. Bronson Ing, and ensure that there is no other optimization that she requires preoperatively such as testing, medication alteration, or procedures.  Given her lack of cardiac symptoms, it is likely that we will be able to proceed with the procedure though I will defer to her cardiologist regarding that.  With respect to her poorly controlled diabetes and a hemoglobin A1c of 8.7, I explained this places her at substantially increased risk for perioperative complications particularly those related to wound healing and infection.  The patient seems somewhat unaware of how diet would influence her blood glucose, or even insulin for that matter asking if too much insulin can cause the hemoglobin A1c to be high.  I explained to her that even when on insulin, diet is critically important in regulating her blood glucose.  I provided her with verbal and written information regarding minimizing simple carbohydrates in her diet.  I discussed with her and her friend the critical importance of tight blood glucose control including frequent checking with her glucometer and strict compliance with her prescribed insulin regimen.  This is being found to be associated with decreased perioperative complications.  Given the concern for underlying malignancy in this patient and the pain associated with this mass, I do not feel that we have time to wait for alterations in her insulin regimen and reevaluation prior to surgery.  Therefore we will attempt to optimize which she is already on and speak with her primary care physician regarding ideal perioperative control of her blood sugar.  With respect to her CKD  likely associated with her hypertension, vascular disease, and poorly controlled diabetes, she appears to have brittle renal function demonstrating an event of AKI experienced after CT scan with IV  contrast.  Her creatinine normalized prior to discharge recently.  However this demonstrates that the patient is susceptible to nephrotoxic agents and we will avoid these perioperatively wherever possible.  I explained that surgical insult including hypotension can cause AKI postoperatively as can many other medications that are necessary around the time of surgery.  I explained that her underlying renal function potentially could be permanently negatively impacted by a surgical intervention, though obviously we will attempt to avoid this in any way potentially controllable.  Presently surgery is scheduled for Jan 20, 2020.  This is pending clearance from her primary care physician and cardiologist.  She understands that we may have to delay surgery if it is deemed to not be medically suitable at this time.  She was thoroughly counseled regarding the potential risks of surgery, how these are increased for her given her underlying comorbidities, and how we will attempt to mitigate these the cannot guarantee that she will not experience a perioperative complication.  HPI: Lisa Jenkins is a 69 year old P0 who was seen in consultation at the request of Dr Glo Herring for evaluation of a pelvic mass and elevated CA 125.  The patient reported a 1 month history of developing lower abdominal generalized pain.  This prompted her to be seen in the emergency department at [facility] on Jan 07, 2020.  At that time a CT scan of the abdomen and pelvis with IV contrast was ordered.  It demonstrated an interval development of an 11 x 8.6 x 8.9 cm heterogeneous cystic and solid pelvic mass predominantly on the left.  The CT scan was unable to identify whether this was uterine or ovarian in origin.  There was no associated ascites, peritoneal carcinomatosis, or lymphadenopathy.  Additional CT findings included a stable 2.9 cm left adrenal mass, and an exophytic lesion from the posterior lower pole of the right kidney.   There was an additional 11 mm right adrenal gland nodule identified. Ca1 25 was drawn on 01/08/2020 and was elevated at 87.7.  During that hospitalization she experienced acute kidney injury, possibly secondary to administration of IV contrast.  This was associated with hyperkalemia.  This was treated with supportive medical care including IV hydration and her condition returned to normal within 4 days.  The patient's medical history is remarkable for type 2 diabetes mellitus which is poorly controlled.  Hemoglobin A1c during the month of May 2021 was 8.7.  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  Her primary care physician Dr. [name] manages her blood glucose and insulin regimen.  The patient has a coronary vascular disease history of a myocardial infarction at age 53, July 09, 2017.  This was managed at [facility] with coronary angiogram and placement of a stent.  She was never placed on Plavix post procedure however has been on aspirin since that time (81 mg).  Dr. Tamala Julian performed her coronary angioplasty in 2018.  Dr. Bronson Ing is her cardiologist in [place].  She saw her her cardiologist for routine visit in October 2020 through a virtual visit.  She reported no concerning symptoms including no shortness of breath, no chest pain, no orthopnea.  No interventions were necessary after that visit.  It is noted that at the time of  her MI in 2018 she underwent an echocardiogram which showed an ejection fraction of 55%.  She has chronic kidney disease as outlined above.  While her creatinine is within normal range at rest, she experienced AKI after administration of IV contrast in May 2021.  Past surgical history is unremarkable with no prior abdominal surgeries.  She has never been pregnant.  She has been married twice.  She has had Pap smears during her reproductive life with no abnormal Pap tests.  She  experienced menopause in her 10s.  She denies postmenopausal bleeding.  The patient has a history of tobacco use.  She lives with her sister-in-law.  She does not work outside of the home.  She is of excellent functional status and is ambulatory through the day without assistance.  She is able to climb a flight of stairs without shortness of breath or weakness.  She is independent with activities of daily living.  Current Meds:  Outpatient Encounter Medications as of 01/14/2020  Medication Sig  . acetaminophen (TYLENOL) 325 MG tablet Take 2 tablets (650 mg total) by mouth every 6 (six) hours.  Marland Kitchen allopurinol (ZYLOPRIM) 100 MG tablet Take 1 tablet (100 mg total) by mouth daily.  Marland Kitchen aspirin 81 MG chewable tablet Chew 81 mg by mouth daily.   Marland Kitchen atorvastatin (LIPITOR) 80 MG tablet TAKE 1 Tablet BY MOUTH ONCE DAILY AT 6:00PM (Patient taking differently: Take 80 mg by mouth every evening. TAKE 1 Tablet BY MOUTH ONCE DAILY AT 6:00PM)  . chlorthalidone (HYGROTON) 25 MG tablet Take 1 tablet (25 mg total) by mouth daily.  . metFORMIN (GLUCOPHAGE) 1000 MG tablet TAKE 1 Tablet  BY MOUTH TWICE DAILY WITH MEALS (Patient taking differently: Take 1,000 mg by mouth 2 (two) times daily with a meal. )  . metoprolol tartrate (LOPRESSOR) 25 MG tablet TAKE 1 Tablet  BY MOUTH TWICE DAILY (Patient taking differently: Take 25 mg by mouth 2 (two) times daily. )  . OZEMPIC, 1 MG/DOSE, 2 MG/1.5ML SOPN Inject 1 mg into the skin every Thursday.  Marland Kitchen SYNTHROID 25 MCG tablet TAKE 1 Tablet BY MOUTH ONCE DAILY BEFORE BREAKFAST (Patient taking differently: Take 25 mcg by mouth daily before breakfast. )  . TRESIBA FLEXTOUCH 100 UNIT/ML FlexTouch Pen Inject 0.1 mLs (10 Units total) into the skin every morning.  . Vitamin D, Ergocalciferol, (DRISDOL) 1.25 MG (50000 UNIT) CAPS capsule Take 50,000 Units by mouth once a week.   No facility-administered encounter medications on file as of 01/14/2020.    Allergy: No Known Allergies  Social  Hx:   Social History   Socioeconomic History  . Marital status: Single    Spouse name: Not on file  . Number of children: Not on file  . Years of education: Not on file  . Highest education level: Not on file  Occupational History  . Not on file  Tobacco Use  . Smoking status: Current Every Day Smoker    Packs/day: 0.25    Years: 46.00    Pack years: 11.50    Types: Cigarettes  . Smokeless tobacco: Never Used  Substance and Sexual Activity  . Alcohol use: No    Alcohol/week: 0.0 standard drinks  . Drug use: No  . Sexual activity: Not Currently  Other Topics Concern  . Not on file  Social History Narrative  . Not on file   Social Determinants of Health   Financial Resource Strain:   . Difficulty of Paying Living Expenses:   Food Insecurity:   .  Worried About Charity fundraiser in the Last Year:   . Arboriculturist in the Last Year:   Transportation Needs:   . Film/video editor (Medical):   Marland Kitchen Lack of Transportation (Non-Medical):   Physical Activity:   . Days of Exercise per Week:   . Minutes of Exercise per Session:   Stress:   . Feeling of Stress :   Social Connections:   . Frequency of Communication with Friends and Family:   . Frequency of Social Gatherings with Friends and Family:   . Attends Religious Services:   . Active Member of Clubs or Organizations:   . Attends Archivist Meetings:   Marland Kitchen Marital Status:   Intimate Partner Violence:   . Fear of Current or Ex-Partner:   . Emotionally Abused:   Marland Kitchen Physically Abused:   . Sexually Abused:     Past Surgical Hx:  Past Surgical History:  Procedure Laterality Date  . BUNIONECTOMY Bilateral   . CORONARY STENT INTERVENTION N/A 07/09/2017   Procedure: CORONARY STENT INTERVENTION;  Surgeon: Belva Crome, MD;  Location: [facility];  Service: Cardiovascular;  Laterality: N/A;  . LEFT HEART CATH AND CORONARY ANGIOGRAPHY N/A 07/09/2017   Procedure: LEFT HEART CATH AND CORONARY ANGIOGRAPHY;   Surgeon: Belva Crome, MD;  Location: [facility];  Service: Cardiovascular;  Laterality: N/A;    Past Medical Hx:  Past Medical History:  Diagnosis Date  . Coronary artery disease    a. s/p NSTEMI in 07/2017 with DES to mid-RCA and residual disease along proximal-mid LAD  . Diabetes mellitus without complication ([place])   . High cholesterol   . Hyperkalemia   . Hypertension   . Myocardial infarction [facility])     Past Gynecological History:  See HPI No LMP recorded. Patient is postmenopausal.  Family Hx:  Family History  Problem Relation Age of Onset  . Diabetes Mother   . Heart disease Mother   . Heart disease Father   . Diabetes Sister   . Heart disease Sister   . Diabetes Brother   . Heart disease Brother   . Diabetes Brother   . Heart disease Brother   . Diabetes Brother   . Heart disease Brother   . Diabetes Brother   . Heart disease Brother   . Diabetes Brother   . Heart disease Brother   . Breast cancer Neg Hx   . Ovarian cancer Neg Hx   . Endometrial cancer Neg Hx   . Colon cancer Neg Hx     Review of Systems:  Constitutional  Feels fatigued  ENT Normal appearing ears and nares bilaterally Skin/Breast  No rash, sores, jaundice, itching, dryness Cardiovascular  No chest pain, shortness of breath, or edema  Pulmonary  No cough or wheeze.  Gastro Intestinal  No nausea, vomitting, or diarrhoea. + abdo pain, no change in bowel habit Genito Urinary  No frequency, urgency, dysuria, no bleeding Musculo Skeletal  No myalgia, arthralgia, joint swelling or pain  Neurologic  No weakness, numbness, change in gait,  Psychology  No depression, anxiety, insomnia.   Vitals:  Blood pressure (!) 143/65, pulse 65, temperature 98 F (36.7 C), temperature source Temporal, resp. rate 16, height 5\' 3"  (1.6 m), weight 138 lb (62.6 kg), SpO2 100 %.  Physical Exam: WD in NAD Neck  Supple NROM, without any enlargements.  Lymph Node Survey No cervical  supraclavicular or inguinal adenopathy Cardiovascular  Pulse normal rate, regularity  and rhythm. S1 and S2 normal.  Lungs  Clear to auscultation bilateraly, without wheezes/crackles/rhonchi. Good air movement.  Skin  No rash/lesions/breakdown. Echymoses on arms from recent hospitalization.  Psychiatry  Alert and oriented to person, place, and time  Abdomen  Normoactive bowel sounds, abdomen soft, non-tender and nonobese without evidence of hernia.  Back No CVA tenderness Genito Urinary  Vulva/vagina: Normal external female genitalia.  No lesions. No discharge or bleeding.  Bladder/urethra:  No lesions or masses, well supported bladder  Vagina: atrophic, narrow, normal, no lesions  Cervix: Normal appearing, no lesions. Displaced anteriorally.  Uterus:  Small, mobile, no parametrial involvement or nodularity. Feels anterior to the mass.  Adnexa: Large, nodular, minimally mobile pelvic mass filling upper pelvis and extending posteriorally. Rectal  Good tone, mass appreciated, however not obviously adherent to the rectum and no cul de sac nodularity present. Extremities  No bilateral cyanosis, clubbing or edema.   Thereasa Solo, MD  01/14/2020, 12:49 PM

## 2020-01-15 ENCOUNTER — Telehealth: Payer: Self-pay | Admitting: *Deleted

## 2020-01-15 NOTE — Telephone Encounter (Signed)
Received the medical and cardiology clearance. Fax to pre admission office, copy sent to be scanned and one in the chart. Called and left the patient a message stating that we received the forms

## 2020-01-16 ENCOUNTER — Other Ambulatory Visit: Payer: Self-pay

## 2020-01-16 ENCOUNTER — Encounter: Payer: Medicare HMO | Admitting: Obstetrics and Gynecology

## 2020-01-16 ENCOUNTER — Encounter (HOSPITAL_COMMUNITY): Payer: Self-pay

## 2020-01-16 ENCOUNTER — Encounter (HOSPITAL_COMMUNITY)
Admission: RE | Admit: 2020-01-16 | Discharge: 2020-01-16 | Disposition: A | Discharge status: Home / Self Care | Payer: Medicare HMO | Source: Ambulatory Visit | Attending: Gynecologic Oncology | Admitting: Gynecologic Oncology

## 2020-01-16 ENCOUNTER — Other Ambulatory Visit (HOSPITAL_COMMUNITY)
Admission: RE | Admit: 2020-01-16 | Discharge: 2020-01-16 | Disposition: A | Discharge status: Home / Self Care | Payer: Medicare HMO | Source: Ambulatory Visit | Attending: Gynecologic Oncology | Admitting: Gynecologic Oncology

## 2020-01-16 DIAGNOSIS — R971 Elevated cancer antigen 125 [CA 125]: Secondary | ICD-10-CM | POA: Insufficient documentation

## 2020-01-16 DIAGNOSIS — F17210 Nicotine dependence, cigarettes, uncomplicated: Secondary | ICD-10-CM | POA: Insufficient documentation

## 2020-01-16 DIAGNOSIS — Z20822 Contact with and (suspected) exposure to covid-19: Secondary | ICD-10-CM | POA: Insufficient documentation

## 2020-01-16 DIAGNOSIS — I252 Old myocardial infarction: Secondary | ICD-10-CM | POA: Insufficient documentation

## 2020-01-16 DIAGNOSIS — Z01818 Encounter for other preprocedural examination: Secondary | ICD-10-CM | POA: Diagnosis not present

## 2020-01-16 DIAGNOSIS — I2510 Atherosclerotic heart disease of native coronary artery without angina pectoris: Secondary | ICD-10-CM | POA: Insufficient documentation

## 2020-01-16 DIAGNOSIS — Z7984 Long term (current) use of oral hypoglycemic drugs: Secondary | ICD-10-CM | POA: Insufficient documentation

## 2020-01-16 DIAGNOSIS — Z79899 Other long term (current) drug therapy: Secondary | ICD-10-CM | POA: Diagnosis not present

## 2020-01-16 DIAGNOSIS — I10 Essential (primary) hypertension: Secondary | ICD-10-CM | POA: Insufficient documentation

## 2020-01-16 DIAGNOSIS — E559 Vitamin D deficiency, unspecified: Secondary | ICD-10-CM | POA: Insufficient documentation

## 2020-01-16 DIAGNOSIS — Z79890 Hormone replacement therapy (postmenopausal): Secondary | ICD-10-CM | POA: Insufficient documentation

## 2020-01-16 DIAGNOSIS — E119 Type 2 diabetes mellitus without complications: Secondary | ICD-10-CM | POA: Insufficient documentation

## 2020-01-16 DIAGNOSIS — Z7982 Long term (current) use of aspirin: Secondary | ICD-10-CM | POA: Diagnosis not present

## 2020-01-16 DIAGNOSIS — R1900 Intra-abdominal and pelvic swelling, mass and lump, unspecified site: Secondary | ICD-10-CM | POA: Insufficient documentation

## 2020-01-16 DIAGNOSIS — Z955 Presence of coronary angioplasty implant and graft: Secondary | ICD-10-CM | POA: Insufficient documentation

## 2020-01-16 DIAGNOSIS — M1990 Unspecified osteoarthritis, unspecified site: Secondary | ICD-10-CM | POA: Diagnosis not present

## 2020-01-16 DIAGNOSIS — E7800 Pure hypercholesterolemia, unspecified: Secondary | ICD-10-CM | POA: Insufficient documentation

## 2020-01-16 HISTORY — DX: Vitamin D deficiency, unspecified: E55.9

## 2020-01-16 HISTORY — DX: Unspecified osteoarthritis, unspecified site: M19.90

## 2020-01-16 LAB — CBC
HCT: 32.2 % — ABNORMAL LOW (ref 36.0–46.0)
Hemoglobin: 10.3 g/dL — ABNORMAL LOW (ref 12.0–15.0)
MCH: 30.5 pg (ref 26.0–34.0)
MCHC: 32 g/dL (ref 30.0–36.0)
MCV: 95.3 fL (ref 80.0–100.0)
Platelets: 391 10*3/uL (ref 150–400)
RBC: 3.38 MIL/uL — ABNORMAL LOW (ref 3.87–5.11)
RDW: 12.9 % (ref 11.5–15.5)
WBC: 14.2 10*3/uL — ABNORMAL HIGH (ref 4.0–10.5)
nRBC: 0 % (ref 0.0–0.2)

## 2020-01-16 LAB — COMPREHENSIVE METABOLIC PANEL
ALT: 11 U/L (ref 0–44)
AST: 12 U/L — ABNORMAL LOW (ref 15–41)
Albumin: 3.2 g/dL — ABNORMAL LOW (ref 3.5–5.0)
Alkaline Phosphatase: 114 U/L (ref 38–126)
Anion gap: 10 (ref 5–15)
BUN: 16 mg/dL (ref 8–23)
CO2: 23 mmol/L (ref 22–32)
Calcium: 8.6 mg/dL — ABNORMAL LOW (ref 8.9–10.3)
Chloride: 100 mmol/L (ref 98–111)
Creatinine, Ser: 1.17 mg/dL — ABNORMAL HIGH (ref 0.44–1.00)
GFR calc Af Amer: 56 mL/min — ABNORMAL LOW (ref >60)
GFR calc non Af Amer: 49 mL/min — ABNORMAL LOW (ref >60)
Glucose, Bld: 360 mg/dL — ABNORMAL HIGH (ref 70–99)
Potassium: 5.1 mmol/L (ref 3.5–5.1)
Sodium: 133 mmol/L — ABNORMAL LOW (ref 135–145)
Total Bilirubin: 0.2 mg/dL — ABNORMAL LOW (ref 0.3–1.2)
Total Protein: 6.6 g/dL (ref 6.5–8.1)

## 2020-01-16 LAB — SARS CORONAVIRUS 2 (TAT 6-24 HRS): SARS Coronavirus 2: NEGATIVE

## 2020-01-16 NOTE — Progress Notes (Signed)
Lisa Jenkins was unable to void enough urine for UA will collect day of surgery.

## 2020-01-16 NOTE — Progress Notes (Signed)
PCP - Dr. Novella Rob Cardiologist - Dr. Bronson Ing  Chest x-ray - greater than 1 year EKG - 01/12/20 Stress Test - N/A ECHO - N/A Cardiac Cath - greater than 2 years  Sleep Study - N/A CPAP - N/A  Fasting Blood Sugar - 80-145 Checks Blood Sugar ___2__ times a day  Blood Thinner Instructions: N/A Aspirin Instructions: Instructed to call Dr. Denman George for instrutions Last Dose:  Anesthesia review: MI, CAD, DM, HTN  Patient denies shortness of breath, fever, cough and chest pain at PAT appointment   Patient verbalized understanding of instructions that were given to them at the PAT appointment. Patient was also instructed that they will need to review over the PAT instructions again at home before surgery.

## 2020-01-16 NOTE — Patient Instructions (Addendum)
DUE TO COVID-19 ONLY ONE VISITOR ARE ALLOWED TO COME WITH YOU AND STAY IN THE WAITING ROOM ONLY DURING PRE OP AND PROCEDURE. THEN TWO VISITORS MAY VISIT WITH YOU IN YOUR PRIVATE ROOM DURING VISITING HOURS ONLY!!   COVID SWAB TESTING MUST BE COMPLETED ON:  Friday, Jan 16, 2020 at 2:10 PM   [address], [place] -[facility] enter pre surgical testing line (Must self quarantine after testing. Follow instructions on handout.)             Your procedure is scheduled on: Tuesday, Jan 20, 2020   Report to [facility] Main  Entrance    Report to admitting at 10:30 AM   Call this number if you have problems the morning of surgery [phone]   Light diet the day before (avoid gas producing foods, high sugar, and high carb).    Clear liquids up until three hours prior to scheduled surgery until 9:30 AM day of surgery   CLEAR LIQUID DIET  Foods Allowed                                                                     Foods Excluded  Water, Black Coffee and tea, regular and decaf                             liquids that you cannot  Plain Jell-O in any flavor  (No red)                                           see through such as: Fruit ices (not with fruit pulp)                                     milk, soups, orange juice  Iced Popsicles (No red)                                    All solid food Carbonated beverages, regular and diet                                    Apple juices Sports drinks like Gatorade (No red) Lightly seasoned clear broth or consume(fat free) Sugar, honey syrup  Sample Menu Breakfast                                Lunch                                     Supper Cranberry juice                    Beef broth  Chicken broth Jell-O                                     Grape juice                           Apple juice Coffee or tea                        Jell-O                                      Popsicle                                                Coffee or tea                        Coffee or tea    Oral Hygiene is also important to reduce your risk of infection.                                    Remember - BRUSH YOUR TEETH THE MORNING OF SURGERY WITH YOUR REGULAR TOOTHPASTE   Do NOT smoke after Midnight   Take these medicines the morning of surgery with A SIP OF WATER:  Allopurinol, Metoprolol, Synthroid  DO NOT TAKE ANY ORAL DIABETIC MEDICATIONS DAY OF YOUR SURGERY  Only take 1/2 (half) normal Tresiba dose the morning of surgery                               You may not have any metal on your body including hair pins, jewelry, and body piercings             Do not wear make-up, lotions, powders, perfumes/cologne, or deodorant             Do not wear nail polish.  Do not shave  48 hours prior to surgery.                Do not bring valuables to the hospital. [place].   Contacts, dentures or bridgework may not be worn into surgery.   Bring small overnight bag day of surgery.    Patients discharged the day of surgery will not be allowed to drive home.   Special Instructions: Bring a copy of your healthcare power of attorney and living will documents         the day of surgery if you haven't scanned them in before.              Please read over the following fact sheets you were given: IF YOU HAVE QUESTIONS ABOUT YOUR PRE OP INSTRUCTIONS PLEASE CALL [phone]   [facility] - Preparing for Surgery Before surgery, you can play an important role.  Because skin is not sterile, your skin needs to be as free of germs as possible.  You can reduce the number of germs on your skin by washing with CHG (chlorahexidine gluconate) soap before surgery.  CHG is an antiseptic cleaner which kills germs and bonds with the skin to continue killing germs even after washing. Please DO NOT use if you have an allergy to CHG or antibacterial soaps.   If your skin becomes reddened/irritated stop using the CHG and inform your nurse when you arrive at Short Stay. Do not shave (including legs and underarms) for at least 48 hours prior to the first CHG shower.  You may shave your face/neck.  Please follow these instructions carefully:  1.  Shower with CHG Soap the night before surgery and the  morning of surgery.  2.  If you choose to wash your hair, wash your hair first as usual with your normal  shampoo.  3.  After you shampoo, rinse your hair and body thoroughly to remove the shampoo.                             4.  Use CHG as you would any other liquid soap.  You can apply chg directly to the skin and wash.  Gently with a scrungie or clean washcloth.  5.  Apply the CHG Soap to your body ONLY FROM THE NECK DOWN.   Do   not use on face/ open                           Wound or open sores. Avoid contact with eyes, ears mouth and   genitals (private parts).                       Wash face,  Genitals (private parts) with your normal soap.             6.  Wash thoroughly, paying special attention to the area where your    surgery  will be performed.  7.  Thoroughly rinse your body with warm water from the neck down.  8.  DO NOT shower/wash with your normal soap after using and rinsing off the CHG Soap.                9.  Pat yourself dry with a clean towel.            10.  Wear clean pajamas.            11.  Place clean sheets on your bed the night of your first shower and do not  sleep with pets. Day of Surgery : Do not apply any lotions/deodorants the morning of surgery.  Please wear clean clothes to the hospital/surgery center.  FAILURE TO FOLLOW THESE INSTRUCTIONS MAY RESULT IN THE CANCELLATION OF YOUR SURGERY  PATIENT SIGNATURE_________________________________  NURSE SIGNATURE__________________________________  ________________________________________________________________________   Adam Phenix  An incentive spirometer is a  tool that can help keep your lungs clear and active. This tool measures how well you are filling your lungs with each breath. Taking long deep breaths may help reverse or decrease the chance of developing breathing (pulmonary) problems (especially infection) following:  A long period of time when you are unable to move or be active. BEFORE THE PROCEDURE   If the spirometer includes an indicator to show your best effort, your nurse or respiratory therapist will set it to a desired goal.  If possible, sit up straight or lean  slightly forward. Try not to slouch.  Hold the incentive spirometer in an upright position. INSTRUCTIONS FOR USE  1. Sit on the edge of your bed if possible, or sit up as far as you can in bed or on a chair. 2. Hold the incentive spirometer in an upright position. 3. Breathe out normally. 4. Place the mouthpiece in your mouth and seal your lips tightly around it. 5. Breathe in slowly and as deeply as possible, raising the piston or the ball toward the top of the column. 6. Hold your breath for 3-5 seconds or for as long as possible. Allow the piston or ball to fall to the bottom of the column. 7. Remove the mouthpiece from your mouth and breathe out normally. 8. Rest for a few seconds and repeat Steps 1 through 7 at least 10 times every 1-2 hours when you are awake. Take your time and take a few normal breaths between deep breaths. 9. The spirometer may include an indicator to show your best effort. Use the indicator as a goal to work toward during each repetition. 10. After each set of 10 deep breaths, practice coughing to be sure your lungs are clear. If you have an incision (the cut made at the time of surgery), support your incision when coughing by placing a pillow or rolled up towels firmly against it. Once you are able to get out of bed, walk around indoors and cough well. You may stop using the incentive spirometer when instructed by your caregiver.  RISKS AND  COMPLICATIONS  Take your time so you do not get dizzy or light-headed.  If you are in pain, you may need to take or ask for pain medication before doing incentive spirometry. It is harder to take a deep breath if you are having pain. AFTER USE  Rest and breathe slowly and easily.  It can be helpful to keep track of a log of your progress. Your caregiver can provide you with a simple table to help with this. If you are using the spirometer at home, follow these instructions: [place] IF:   You are having difficultly using the spirometer.  You have trouble using the spirometer as often as instructed.  Your pain medication is not giving enough relief while using the spirometer.  You develop fever of 100.5 F (38.1 C) or higher. SEEK IMMEDIATE MEDICAL CARE IF:   You cough up bloody sputum that had not been present before.  You develop fever of 102 F (38.9 C) or greater.  You develop worsening pain at or near the incision site. MAKE SURE YOU:   Understand these instructions.  Will watch your condition.  Will get help right away if you are not doing well or get worse. Document Released: 01/01/2007 Document Revised: 11/13/2011 Document Reviewed: 03/04/2007 ExitCare Patient Information 2014 Exit[employer], [place].   ________________________________________________________________________  WHAT IS A BLOOD TRANSFUSION? Blood Transfusion Information  A transfusion is the replacement of blood or some of its parts. Blood is made up of multiple cells which provide different functions.  Red blood cells carry oxygen and are used for blood loss replacement.  White blood cells fight against infection.  Platelets control bleeding.  Plasma helps clot blood.  Other blood products are available for specialized needs, such as hemophilia or other clotting disorders. BEFORE THE TRANSFUSION  Who gives blood for transfusions?   Healthy volunteers who are fully evaluated to make sure  their blood is safe. This is blood bank blood. Transfusion  therapy is the safest it has ever been in the practice of medicine. Before blood is taken from a donor, a complete history is taken to make sure that person has no history of diseases nor engages in risky social behavior (examples are intravenous drug use or sexual activity with multiple partners). The donor's travel history is screened to minimize risk of transmitting infections, such as malaria. The donated blood is tested for signs of infectious diseases, such as HIV and hepatitis. The blood is then tested to be sure it is compatible with you in order to minimize the chance of a transfusion reaction. If you or a relative donates blood, this is often done in anticipation of surgery and is not appropriate for emergency situations. It takes many days to process the donated blood. RISKS AND COMPLICATIONS Although transfusion therapy is very safe and saves many lives, the main dangers of transfusion include:   Getting an infectious disease.  Developing a transfusion reaction. This is an allergic reaction to something in the blood you were given. Every precaution is taken to prevent this. The decision to have a blood transfusion has been considered carefully by your caregiver before blood is given. Blood is not given unless the benefits outweigh the risks. AFTER THE TRANSFUSION  Right after receiving a blood transfusion, you will usually feel much better and more energetic. This is especially true if your red blood cells have gotten low (anemic). The transfusion raises the level of the red blood cells which carry oxygen, and this usually causes an energy increase.  The nurse administering the transfusion will monitor you carefully for complications. HOME CARE INSTRUCTIONS  No special instructions are needed after a transfusion. You may find your energy is better. Speak with your caregiver about any limitations on activity for underlying diseases  you may have. SEEK MEDICAL CARE IF:   Your condition is not improving after your transfusion.  You develop redness or irritation at the intravenous (IV) site. SEEK IMMEDIATE MEDICAL CARE IF:  Any of the following symptoms occur over the next 12 hours:  Shaking chills.  You have a temperature by mouth above 102 F (38.9 C), not controlled by medicine.  Chest, back, or muscle pain.  People around you feel you are not acting correctly or are confused.  Shortness of breath or difficulty breathing.  Dizziness and fainting.  You get a rash or develop hives.  You have a decrease in urine output.  Your urine turns a dark color or changes to pink, red, or brown. Any of the following symptoms occur over the next 10 days:  You have a temperature by mouth above 102 F (38.9 C), not controlled by medicine.  Shortness of breath.  Weakness after normal activity.  The white part of the eye turns yellow (jaundice).  You have a decrease in the amount of urine or are urinating less often.  Your urine turns a dark color or changes to pink, red, or brown. Document Released: 08/18/2000 Document Revised: 11/13/2011 Document Reviewed: 04/06/2008 [facility] Patient Information 2014 [place], [place].  _______________________________________________________________________

## 2020-01-17 LAB — URINALYSIS, ROUTINE W REFLEX MICROSCOPIC
Glucose, UA: 100 mg/dL — AB
Ketones, ur: 5 mg/dL — AB
Leukocytes,Ua: NEGATIVE
Nitrite: NEGATIVE
Protein, ur: >300 mg/dL — AB
Specific Gravity, Urine: >1.03 — ABNORMAL HIGH (ref 1.005–1.030)
pH: 5.5 (ref 5.0–8.0)

## 2020-01-17 LAB — URINALYSIS, MICROSCOPIC (REFLEX)

## 2020-01-17 LAB — ABO/RH: ABO/RH(D): O POS

## 2020-01-19 ENCOUNTER — Telehealth: Payer: Self-pay

## 2020-01-19 NOTE — Telephone Encounter (Signed)
LM for Lisa Jenkins to call back to the office today prior to 1615 to review pre op instructions.

## 2020-01-19 NOTE — Progress Notes (Addendum)
Anesthesia Chart Review   Case: [id number] Date/Time: 01/20/20 1215   Procedure: XI ROBOTIC ASSISTED TOTAL HYSTERECTOMY WITH BILATERAL SALPINGO OOPHORECTOMY, MINI LAPAROTOMY POSSIBLE STAGING,POSSIBLE LAPAROTOMY (Bilateral )   Anesthesia type: General   Pre-op diagnosis: PELVIC MASS, ELEVATED CA 125   Location: [facility] ROOM 03 / [facility]   Surgeons: Everitt Amber, MD      DISCUSSION:69 y.o. current every day smoker (23 pack years) with h/o DM II, HTN, CAD (NSTEMI DES to mid RCA 07/2017), pelvic mass scheduled for above procedure 01/20/2020 with Dr. Everitt Amber.   Recent hospitalization 5/8-5/06/2020 for hyperkalemia in the context of newly diagnosed pelvic mass.   Per cardiology 01/14/2020, "Chart reviewed as part of pre-operative protocol coverage. Given past medical history and time since last visit, based on ACC/AHA guidelines, MASEL HANING would be at acceptable risk for the planned procedure without further cardiovascular testing.  Per Dr. Bronson Ing 01/12/2020 Preoperative risk stratification: She denies anginal pain with routine household activities. She continues to remain stable from a cardiovascular standpoint. I do not feel any preoperative cardiac testing is indicated at this time. She can proceed with gynecologic surgery with acceptable level of risk. Aspirin can be held 5 to 7 days prior to surgery. Continue atorvastatin and metoprolol. Its okay to continue ASA peri-operatively."  Elevated blood sugar at PAT visit, glucose 360.  Pt reports checking her blood sugar at home 2 times per day with fasting sugar 80-145.  Pre-op medication instructions given.  Last A1C 8.7.  Dr. Denman George aware.  Will evaluate DOS. Discussed with Dr. Valma Cava.  VS: Ht 5\' 3"  (1.6 m)   Wt 62.6 kg   BMI 24.45 kg/m   PROVIDERS: Denyce Robert, FNP is PCP   Lavell Luster, MD is Cardiologist last seen 07/01/2019 LABS: Elevated glucose (all labs ordered are listed, but only abnormal results are displayed)  Labs  Reviewed - No data to display   IMAGES:   EKG: 01/12/2020 Rate 74 bpm Sinus rhythm  Low voltage, precordial leads   CV: Cardiac Cath 07/09/2017  High-grade obstruction of the mid right coronary serving as the culprit for the patient's presentation with acute coronary syndrome.  Successful drug-eluting stent implantation in the mid right coronary decreasing tubular segmental 90% stenosis to 0% with TIMI grade III flow using a 3.0 x 28 mm Synergy post dilating to 3.5 mm in diameter.  Widely patent left main, and circumflex.  Eccentric 50% proximal to mid LAD after the first diagonal.  Overall normal LV function.  EF estimated to be 55% with normal filling pressures.  RECOMMENDATIONS:  Aspirin and Plavix for 12 months.  Risk factor modification: Smoking cessation, aggressive lipid-lowering to LDL less than 70, blood pressure control, and screening for diabetes mellitus.  Eligible for discharge in a.m. Past Medical History:  Diagnosis Date  . Arthritis   . Coronary artery disease    a. s/p NSTEMI in 07/2017 with DES to mid-RCA and residual disease along proximal-mid LAD  . Diabetes mellitus without complication ([place])   . High cholesterol   . Hyperkalemia   . Hypertension   . Myocardial infarction ([place])   . Vitamin D deficiency     Past Surgical History:  Procedure Laterality Date  . BUNIONECTOMY Bilateral   . CORONARY STENT INTERVENTION N/A 07/09/2017   Procedure: CORONARY STENT INTERVENTION;  Surgeon: Belva Crome, MD;  Location: [facility];  Service: Cardiovascular;  Laterality: N/A;  . LEFT HEART CATH AND CORONARY ANGIOGRAPHY N/A 07/09/2017   Procedure:  LEFT HEART CATH AND CORONARY ANGIOGRAPHY;  Surgeon: Belva Crome, MD;  Location: [facility];  Service: Cardiovascular;  Laterality: N/A;    MEDICATIONS: . acetaminophen (TYLENOL) 325 MG tablet  . allopurinol (ZYLOPRIM) 100 MG tablet  . aspirin 81 MG chewable tablet  . atorvastatin (LIPITOR) 80  MG tablet  . chlorthalidone (HYGROTON) 25 MG tablet  . metFORMIN (GLUCOPHAGE) 1000 MG tablet  . metoprolol tartrate (LOPRESSOR) 25 MG tablet  . OZEMPIC, 1 MG/DOSE, 2 MG/1.5ML SOPN  . SYNTHROID 25 MCG tablet  . TRESIBA FLEXTOUCH 100 UNIT/ML FlexTouch Pen  . Vitamin D, Ergocalciferol, (DRISDOL) 1.25 MG (50000 UNIT) CAPS capsule   No current facility-administered medications for this encounter.    [facility] Pre-Surgical Testing [phone] 01/19/20  4:32 PM

## 2020-01-19 NOTE — Telephone Encounter (Signed)
Reviewed written pre op  instructions given pt for surgery 01-20-20. Pt verbalized understanding of diabetic medication Tresiba dosing of 50 % tomorrow morning. Knows to arrive at admitting at 1030. Told Ms Genevive Bi that her BS on 01-16-20 was high at 360.  She needs to follow a strict diabetic diet as the increase in BS can affect healing post operatively. Pt verbalized understanding.

## 2020-01-20 ENCOUNTER — Ambulatory Visit (HOSPITAL_COMMUNITY)
Admission: RE | Admit: 2020-01-20 | Discharge: 2020-01-21 | Disposition: A | Discharge status: Home / Self Care | Payer: Medicare HMO | Source: Ambulatory Visit | Attending: Gynecologic Oncology | Admitting: Gynecologic Oncology

## 2020-01-20 ENCOUNTER — Encounter (HOSPITAL_COMMUNITY)
Admission: RE | Disposition: A | Discharge status: Home / Self Care | Payer: Self-pay | Source: Ambulatory Visit | Attending: Gynecologic Oncology

## 2020-01-20 ENCOUNTER — Ambulatory Visit (HOSPITAL_COMMUNITY): Payer: Medicare HMO | Admitting: Certified Registered Nurse Anesthetist

## 2020-01-20 ENCOUNTER — Other Ambulatory Visit: Payer: Self-pay

## 2020-01-20 ENCOUNTER — Encounter (HOSPITAL_COMMUNITY): Payer: Self-pay | Admitting: Gynecologic Oncology

## 2020-01-20 DIAGNOSIS — Z720 Tobacco use: Secondary | ICD-10-CM | POA: Diagnosis present

## 2020-01-20 DIAGNOSIS — F17210 Nicotine dependence, cigarettes, uncomplicated: Secondary | ICD-10-CM | POA: Insufficient documentation

## 2020-01-20 DIAGNOSIS — I252 Old myocardial infarction: Secondary | ICD-10-CM | POA: Diagnosis not present

## 2020-01-20 DIAGNOSIS — I2510 Atherosclerotic heart disease of native coronary artery without angina pectoris: Secondary | ICD-10-CM | POA: Diagnosis not present

## 2020-01-20 DIAGNOSIS — I129 Hypertensive chronic kidney disease with stage 1 through stage 4 chronic kidney disease, or unspecified chronic kidney disease: Secondary | ICD-10-CM | POA: Insufficient documentation

## 2020-01-20 DIAGNOSIS — C549 Malignant neoplasm of corpus uteri, unspecified: Secondary | ICD-10-CM | POA: Diagnosis present

## 2020-01-20 DIAGNOSIS — E1122 Type 2 diabetes mellitus with diabetic chronic kidney disease: Secondary | ICD-10-CM | POA: Insufficient documentation

## 2020-01-20 DIAGNOSIS — Z7982 Long term (current) use of aspirin: Secondary | ICD-10-CM | POA: Diagnosis not present

## 2020-01-20 DIAGNOSIS — Z7984 Long term (current) use of oral hypoglycemic drugs: Secondary | ICD-10-CM | POA: Diagnosis not present

## 2020-01-20 DIAGNOSIS — E1165 Type 2 diabetes mellitus with hyperglycemia: Secondary | ICD-10-CM | POA: Insufficient documentation

## 2020-01-20 DIAGNOSIS — IMO0002 Reserved for concepts with insufficient information to code with codable children: Secondary | ICD-10-CM | POA: Diagnosis present

## 2020-01-20 DIAGNOSIS — C542 Malignant neoplasm of myometrium: Secondary | ICD-10-CM | POA: Diagnosis not present

## 2020-01-20 DIAGNOSIS — R1900 Intra-abdominal and pelvic swelling, mass and lump, unspecified site: Secondary | ICD-10-CM | POA: Diagnosis present

## 2020-01-20 DIAGNOSIS — Z955 Presence of coronary angioplasty implant and graft: Secondary | ICD-10-CM | POA: Diagnosis not present

## 2020-01-20 DIAGNOSIS — N189 Chronic kidney disease, unspecified: Secondary | ICD-10-CM | POA: Insufficient documentation

## 2020-01-20 HISTORY — PX: ROBOTIC ASSISTED TOTAL HYSTERECTOMY WITH BILATERAL SALPINGO OOPHERECTOMY: SHX6086

## 2020-01-20 LAB — POCT I-STAT, CHEM 8
BUN: 14 mg/dL (ref 8–23)
Calcium, Ion: 1.11 mmol/L — ABNORMAL LOW (ref 1.15–1.40)
Chloride: 102 mmol/L (ref 98–111)
Creatinine, Ser: 0.8 mg/dL (ref 0.44–1.00)
Glucose, Bld: 158 mg/dL — ABNORMAL HIGH (ref 70–99)
HCT: 35 % — ABNORMAL LOW (ref 36.0–46.0)
Hemoglobin: 11.9 g/dL — ABNORMAL LOW (ref 12.0–15.0)
Potassium: 4.8 mmol/L (ref 3.5–5.1)
Sodium: 134 mmol/L — ABNORMAL LOW (ref 135–145)
TCO2: 24 mmol/L (ref 22–32)

## 2020-01-20 LAB — TYPE AND SCREEN
ABO/RH(D): O POS
Antibody Screen: NEGATIVE

## 2020-01-20 LAB — GLUCOSE, CAPILLARY
Glucose-Capillary: 154 mg/dL — ABNORMAL HIGH (ref 70–99)
Glucose-Capillary: 261 mg/dL — ABNORMAL HIGH (ref 70–99)
Glucose-Capillary: 264 mg/dL — ABNORMAL HIGH (ref 70–99)

## 2020-01-20 SURGERY — HYSTERECTOMY, TOTAL, ROBOT-ASSISTED, LAPAROSCOPIC, WITH BILATERAL SALPINGO-OOPHORECTOMY
Anesthesia: General | Laterality: Bilateral

## 2020-01-20 MED ORDER — BUPIVACAINE HCL 0.25 % IJ SOLN
INTRAMUSCULAR | Status: DC | PRN
  Administered 2020-01-20: 40 mL

## 2020-01-20 MED ORDER — ONDANSETRON HCL 4 MG/2ML IJ SOLN
INTRAMUSCULAR | Status: DC | PRN
  Administered 2020-01-20: 4 mg via INTRAVENOUS

## 2020-01-20 MED ORDER — ATORVASTATIN CALCIUM 80 MG PO TABS
80.0000 mg | ORAL_TABLET | Freq: Every evening | ORAL | Status: DC
  Administered 2020-01-21: 80 mg via ORAL
  Filled 2020-01-20: qty 2
  Filled 2020-01-20: qty 1

## 2020-01-20 MED ORDER — INSULIN GLARGINE 100 UNIT/ML ~~LOC~~ SOLN
10.0000 [IU] | Freq: Every day | SUBCUTANEOUS | Status: DC
  Administered 2020-01-20: 10 [IU] via SUBCUTANEOUS
  Filled 2020-01-20 (×2): qty 0.1

## 2020-01-20 MED ORDER — DEXTROSE-NACL 5-0.45 % IV SOLN: INTRAVENOUS | Status: DC

## 2020-01-20 MED ORDER — DEXMEDETOMIDINE HCL IN NACL 200 MCG/50ML IV SOLN
INTRAVENOUS | Status: DC | PRN
  Administered 2020-01-20 (×3): 4 ug via INTRAVENOUS

## 2020-01-20 MED ORDER — ENOXAPARIN SODIUM 40 MG/0.4ML ~~LOC~~ SOLN
40.0000 mg | SUBCUTANEOUS | Status: DC
  Administered 2020-01-21: 40 mg via SUBCUTANEOUS
  Filled 2020-01-20: qty 0.4

## 2020-01-20 MED ORDER — OXYCODONE HCL 5 MG PO TABS
5.0000 mg | ORAL_TABLET | ORAL | Status: DC | PRN
  Administered 2020-01-20: 5 mg via ORAL
  Filled 2020-01-20: qty 1

## 2020-01-20 MED ORDER — HYDROMORPHONE HCL 1 MG/ML IJ SOLN: 0.5000 mg | INTRAMUSCULAR | Status: DC | PRN

## 2020-01-20 MED ORDER — ONDANSETRON HCL 4 MG/2ML IJ SOLN
INTRAMUSCULAR | Status: AC
  Filled 2020-01-20: qty 2

## 2020-01-20 MED ORDER — ACETAMINOPHEN 500 MG PO TABS
1000.0000 mg | ORAL_TABLET | Freq: Two times a day (BID) | ORAL | Status: DC
  Administered 2020-01-20 – 2020-01-21 (×3): 1000 mg via ORAL
  Filled 2020-01-20 (×3): qty 2

## 2020-01-20 MED ORDER — SUGAMMADEX SODIUM 200 MG/2ML IV SOLN
INTRAVENOUS | Status: DC | PRN
  Administered 2020-01-20: 140 mg via INTRAVENOUS

## 2020-01-20 MED ORDER — INSULIN DEGLUDEC 100 UNIT/ML ~~LOC~~ SOPN: 10.0000 [IU] | PEN_INJECTOR | Freq: Every morning | SUBCUTANEOUS | Status: DC

## 2020-01-20 MED ORDER — PHENYLEPHRINE HCL (PRESSORS) 10 MG/ML IV SOLN
INTRAVENOUS | Status: AC
  Filled 2020-01-20: qty 1

## 2020-01-20 MED ORDER — HEPARIN SODIUM (PORCINE) 5000 UNIT/ML IJ SOLN
5000.0000 [IU] | INTRAMUSCULAR | Status: AC
  Administered 2020-01-20: 5000 [IU] via SUBCUTANEOUS
  Filled 2020-01-20: qty 1

## 2020-01-20 MED ORDER — SENNOSIDES-DOCUSATE SODIUM 8.6-50 MG PO TABS
2.0000 | ORAL_TABLET | Freq: Every day | ORAL | Status: DC
  Administered 2020-01-20: 2 via ORAL
  Filled 2020-01-20: qty 2

## 2020-01-20 MED ORDER — DEXAMETHASONE SODIUM PHOSPHATE 10 MG/ML IJ SOLN
INTRAMUSCULAR | Status: AC
  Filled 2020-01-20: qty 1

## 2020-01-20 MED ORDER — FENTANYL CITRATE (PF) 100 MCG/2ML IJ SOLN
INTRAMUSCULAR | Status: AC
  Administered 2020-01-20: 25 ug via INTRAVENOUS
  Filled 2020-01-20: qty 2

## 2020-01-20 MED ORDER — FENTANYL CITRATE (PF) 100 MCG/2ML IJ SOLN
INTRAMUSCULAR | Status: AC
  Filled 2020-01-20: qty 2

## 2020-01-20 MED ORDER — CEFAZOLIN SODIUM-DEXTROSE 2-4 GM/100ML-% IV SOLN
2.0000 g | INTRAVENOUS | Status: AC
  Administered 2020-01-20: 2 g via INTRAVENOUS
  Filled 2020-01-20: qty 100

## 2020-01-20 MED ORDER — METOPROLOL TARTRATE 5 MG/5ML IV SOLN
INTRAVENOUS | Status: DC | PRN
  Administered 2020-01-20 (×5): 1 mg via INTRAVENOUS

## 2020-01-20 MED ORDER — TRAMADOL HCL 50 MG PO TABS
100.0000 mg | ORAL_TABLET | Freq: Four times a day (QID) | ORAL | Status: DC | PRN
  Administered 2020-01-20 – 2020-01-21 (×3): 100 mg via ORAL
  Filled 2020-01-20 (×3): qty 2

## 2020-01-20 MED ORDER — ONDANSETRON HCL 4 MG PO TABS
4.0000 mg | ORAL_TABLET | Freq: Four times a day (QID) | ORAL | Status: DC | PRN
  Filled 2020-01-20: qty 1

## 2020-01-20 MED ORDER — ONDANSETRON HCL 4 MG/2ML IJ SOLN: 4.0000 mg | Freq: Four times a day (QID) | INTRAMUSCULAR | Status: DC | PRN

## 2020-01-20 MED ORDER — KETAMINE HCL 10 MG/ML IJ SOLN
INTRAMUSCULAR | Status: DC | PRN
  Administered 2020-01-20: 10 mg via INTRAVENOUS
  Administered 2020-01-20: 15 mg via INTRAVENOUS

## 2020-01-20 MED ORDER — METOPROLOL TARTRATE 25 MG PO TABS
25.0000 mg | ORAL_TABLET | Freq: Two times a day (BID) | ORAL | Status: DC
  Administered 2020-01-21: 25 mg via ORAL
  Filled 2020-01-20: qty 1

## 2020-01-20 MED ORDER — FENTANYL CITRATE (PF) 250 MCG/5ML IJ SOLN
INTRAMUSCULAR | Status: AC
  Filled 2020-01-20: qty 5

## 2020-01-20 MED ORDER — BUPIVACAINE HCL 0.25 % IJ SOLN
INTRAMUSCULAR | Status: AC
  Filled 2020-01-20: qty 1

## 2020-01-20 MED ORDER — PHENYLEPHRINE HCL-NACL 10-0.9 MG/250ML-% IV SOLN
INTRAVENOUS | Status: DC | PRN
Start: 2020-01-20 — End: 2020-01-20
  Administered 2020-01-20: 20 ug/min via INTRAVENOUS

## 2020-01-20 MED ORDER — MIDAZOLAM HCL 5 MG/5ML IJ SOLN: INTRAMUSCULAR | Status: DC | PRN

## 2020-01-20 MED ORDER — MIDAZOLAM HCL 5 MG/5ML IJ SOLN
INTRAMUSCULAR | Status: DC | PRN
  Administered 2020-01-20: 2 mg via INTRAVENOUS

## 2020-01-20 MED ORDER — LEVOTHYROXINE SODIUM 25 MCG PO TABS
25.0000 ug | ORAL_TABLET | Freq: Every day | ORAL | Status: DC
  Administered 2020-01-21: 25 ug via ORAL
  Filled 2020-01-20: qty 1

## 2020-01-20 MED ORDER — LACTATED RINGERS IV SOLN: INTRAVENOUS | Status: DC

## 2020-01-20 MED ORDER — FENTANYL CITRATE (PF) 100 MCG/2ML IJ SOLN
INTRAMUSCULAR | Status: DC | PRN
  Administered 2020-01-20 (×6): 50 ug via INTRAVENOUS

## 2020-01-20 MED ORDER — ACETAMINOPHEN 500 MG PO TABS
1000.0000 mg | ORAL_TABLET | ORAL | Status: DC
  Filled 2020-01-20: qty 2

## 2020-01-20 MED ORDER — LIDOCAINE 2% (20 MG/ML) 5 ML SYRINGE
INTRAMUSCULAR | Status: DC | PRN
  Administered 2020-01-20: 60 mg via INTRAVENOUS

## 2020-01-20 MED ORDER — LIDOCAINE 2% (20 MG/ML) 5 ML SYRINGE
INTRAMUSCULAR | Status: AC
  Filled 2020-01-20: qty 5

## 2020-01-20 MED ORDER — PROPOFOL 10 MG/ML IV BOLUS
INTRAVENOUS | Status: AC
  Filled 2020-01-20: qty 20

## 2020-01-20 MED ORDER — INSULIN ASPART 100 UNIT/ML ~~LOC~~ SOLN
0.0000 [IU] | Freq: Every day | SUBCUTANEOUS | Status: DC
  Administered 2020-01-20: 2 [IU] via SUBCUTANEOUS

## 2020-01-20 MED ORDER — LIDOCAINE 2% (20 MG/ML) 5 ML SYRINGE
INTRAMUSCULAR | Status: DC | PRN
  Administered 2020-01-20: 1.5 mg/kg/h via INTRAVENOUS

## 2020-01-20 MED ORDER — ROCURONIUM BROMIDE 10 MG/ML (PF) SYRINGE
PREFILLED_SYRINGE | INTRAVENOUS | Status: AC
  Filled 2020-01-20: qty 10

## 2020-01-20 MED ORDER — LACTATED RINGERS IR SOLN
Status: DC | PRN
  Administered 2020-01-20: 1

## 2020-01-20 MED ORDER — PROPOFOL 10 MG/ML IV BOLUS
INTRAVENOUS | Status: DC | PRN
  Administered 2020-01-20: 100 mg via INTRAVENOUS

## 2020-01-20 MED ORDER — MIDAZOLAM HCL 2 MG/2ML IJ SOLN
INTRAMUSCULAR | Status: AC
  Filled 2020-01-20: qty 2

## 2020-01-20 MED ORDER — ASPIRIN 81 MG PO CHEW
81.0000 mg | CHEWABLE_TABLET | Freq: Every day | ORAL | Status: DC
  Administered 2020-01-21: 81 mg via ORAL
  Filled 2020-01-20: qty 1

## 2020-01-20 MED ORDER — ROCURONIUM BROMIDE 10 MG/ML (PF) SYRINGE
PREFILLED_SYRINGE | INTRAVENOUS | Status: DC | PRN
  Administered 2020-01-20: 20 mg via INTRAVENOUS
  Administered 2020-01-20: 70 mg via INTRAVENOUS

## 2020-01-20 MED ORDER — FENTANYL CITRATE (PF) 100 MCG/2ML IJ SOLN
25.0000 ug | INTRAMUSCULAR | Status: DC | PRN
  Administered 2020-01-20: 25 ug via INTRAVENOUS

## 2020-01-20 MED ORDER — BUPIVACAINE LIPOSOME 1.3 % IJ SUSP
20.0000 mL | Freq: Once | INTRAMUSCULAR | Status: AC
  Administered 2020-01-20: 20 mL
  Filled 2020-01-20: qty 20

## 2020-01-20 MED ORDER — STERILE WATER FOR IRRIGATION IR SOLN
Status: DC | PRN
  Administered 2020-01-20: 1000 mL

## 2020-01-20 MED ORDER — INSULIN ASPART 100 UNIT/ML ~~LOC~~ SOLN
0.0000 [IU] | Freq: Three times a day (TID) | SUBCUTANEOUS | Status: DC
  Administered 2020-01-20 – 2020-01-21 (×2): 8 [IU] via SUBCUTANEOUS

## 2020-01-20 MED ORDER — DEXAMETHASONE SODIUM PHOSPHATE 10 MG/ML IJ SOLN
INTRAMUSCULAR | Status: DC | PRN
  Administered 2020-01-20: 4 mg via INTRAVENOUS

## 2020-01-20 SURGICAL SUPPLY — 77 items
ADH SKN CLS APL DERMABOND .7 (GAUZE/BANDAGES/DRESSINGS) ×1
AGENT HMST KT MTR STRL THRMB (HEMOSTASIS) ×1
APL ESCP 34 STRL LF DISP (HEMOSTASIS) ×1
APPLICATOR SURGIFLO ENDO (HEMOSTASIS) ×1 IMPLANT
BACTOSHIELD CHG 4% 4OZ (MISCELLANEOUS) ×1
BAG LAPAROSCOPIC 12 15 PORT 16 (BASKET) IMPLANT
BAG RETRIEVAL 12/15 (BASKET) ×2
BAG SPEC RTRVL LRG 6X4 10 (ENDOMECHANICALS)
BLADE SURG SZ10 CARB STEEL (BLADE) IMPLANT
COVER BACK TABLE 60X90IN (DRAPES) ×2 IMPLANT
COVER TIP SHEARS 8 DVNC (MISCELLANEOUS) ×1 IMPLANT
COVER TIP SHEARS 8MM DA VINCI (MISCELLANEOUS) ×2
COVER WAND RF STERILE (DRAPES) IMPLANT
DECANTER SPIKE VIAL GLASS SM (MISCELLANEOUS) IMPLANT
DERMABOND ADVANCED (GAUZE/BANDAGES/DRESSINGS) ×1
DERMABOND ADVANCED .7 DNX12 (GAUZE/BANDAGES/DRESSINGS) ×1 IMPLANT
DRAPE ARM DVNC X/XI (DISPOSABLE) ×4 IMPLANT
DRAPE COLUMN DVNC XI (DISPOSABLE) ×1 IMPLANT
DRAPE DA VINCI XI ARM (DISPOSABLE) ×8
DRAPE DA VINCI XI COLUMN (DISPOSABLE) ×2
DRAPE SHEET LG 3/4 BI-LAMINATE (DRAPES) ×2 IMPLANT
DRAPE SURG IRRIG POUCH 19X23 (DRAPES) ×2 IMPLANT
DRSG OPSITE POSTOP 4X6 (GAUZE/BANDAGES/DRESSINGS) IMPLANT
DRSG OPSITE POSTOP 4X8 (GAUZE/BANDAGES/DRESSINGS) ×1 IMPLANT
ELECT PENCIL ROCKER SW 15FT (MISCELLANEOUS) ×1 IMPLANT
ELECT REM PT RETURN 15FT ADLT (MISCELLANEOUS) ×2 IMPLANT
GLOVE BIO SURGEON STRL SZ 6 (GLOVE) ×8 IMPLANT
GLOVE BIO SURGEON STRL SZ 6.5 (GLOVE) ×4 IMPLANT
GOWN STRL REUS W/ TWL LRG LVL3 (GOWN DISPOSABLE) ×4 IMPLANT
GOWN STRL REUS W/TWL LRG LVL3 (GOWN DISPOSABLE) ×8
HOLDER FOLEY CATH W/STRAP (MISCELLANEOUS) ×2 IMPLANT
IRRIG SUCT STRYKERFLOW 2 WTIP (MISCELLANEOUS) ×2
IRRIGATION SUCT STRKRFLW 2 WTP (MISCELLANEOUS) ×1 IMPLANT
KIT PROCEDURE DA VINCI SI (MISCELLANEOUS)
KIT PROCEDURE DVNC SI (MISCELLANEOUS) IMPLANT
KIT TURNOVER KIT A (KITS) IMPLANT
MANIPULATOR UTERINE 4.5 ZUMI (MISCELLANEOUS) ×2 IMPLANT
NDL SPNL 18GX3.5 QUINCKE PK (NEEDLE) IMPLANT
NEEDLE HYPO 22GX1.5 SAFETY (NEEDLE) ×2 IMPLANT
NEEDLE SPNL 18GX3.5 QUINCKE PK (NEEDLE) IMPLANT
OBTURATOR OPTICAL STANDARD 8MM (TROCAR) ×2
OBTURATOR OPTICAL STND 8 DVNC (TROCAR) ×1
OBTURATOR OPTICALSTD 8 DVNC (TROCAR) ×1 IMPLANT
PACK ROBOT GYN WLCUSTOM (TRAY / TRAY PROCEDURE) ×2 IMPLANT
PAD POSITIONING PINK XL (MISCELLANEOUS) ×2 IMPLANT
PENCIL SMOKE EVACUATOR (MISCELLANEOUS) IMPLANT
PORT ACCESS TROCAR AIRSEAL 12 (TROCAR) ×1 IMPLANT
PORT ACCESS TROCAR AIRSEAL 5M (TROCAR) ×1
POUCH SPECIMEN RETRIEVAL 10MM (ENDOMECHANICALS) IMPLANT
SCRUB CHG 4% DYNA-HEX 4OZ (MISCELLANEOUS) ×1 IMPLANT
SEAL CANN UNIV 5-8 DVNC XI (MISCELLANEOUS) ×3 IMPLANT
SEAL XI 5MM-8MM UNIVERSAL (MISCELLANEOUS) ×6
SEALER VESSEL DA VINCI XI (MISCELLANEOUS) ×2
SEALER VESSEL EXT DVNC XI (MISCELLANEOUS) IMPLANT
SET TRI-LUMEN FLTR TB AIRSEAL (TUBING) ×2 IMPLANT
SPONGE LAP 18X18 RF (DISPOSABLE) ×1 IMPLANT
SURGIFLO W/THROMBIN 8M KIT (HEMOSTASIS) ×1 IMPLANT
SUT MNCRL AB 4-0 PS2 18 (SUTURE) ×3 IMPLANT
SUT MON AB 2-0 CT1 36 (SUTURE) ×1 IMPLANT
SUT PDS AB 1 TP1 96 (SUTURE) IMPLANT
SUT VIC AB 0 CT1 27 (SUTURE) ×4
SUT VIC AB 0 CT1 27XBRD ANTBC (SUTURE) IMPLANT
SUT VIC AB 2-0 CT1 27 (SUTURE)
SUT VIC AB 2-0 CT1 TAPERPNT 27 (SUTURE) IMPLANT
SUT VIC AB 3-0 SH 27 (SUTURE) ×2
SUT VIC AB 3-0 SH 27X BRD (SUTURE) IMPLANT
SUT VIC AB 4-0 PS2 18 (SUTURE) ×2 IMPLANT
SUT VIC AB 4-0 PS2 27 (SUTURE) ×1 IMPLANT
SUT VICRYL 4-0 PS2 18IN ABS (SUTURE) ×4 IMPLANT
SYR 10ML LL (SYRINGE) IMPLANT
TOWEL OR NON WOVEN STRL DISP B (DISPOSABLE) ×2 IMPLANT
TRAP SPECIMEN MUCUS 40CC (MISCELLANEOUS) IMPLANT
TRAY FOLEY MTR SLVR 16FR STAT (SET/KITS/TRAYS/PACK) ×2 IMPLANT
TROCAR XCEL NON-BLD 5MMX100MML (ENDOMECHANICALS) IMPLANT
UNDERPAD 30X36 HEAVY ABSORB (UNDERPADS AND DIAPERS) ×2 IMPLANT
WATER STERILE IRR 1000ML POUR (IV SOLUTION) ×2 IMPLANT
YANKAUER SUCT BULB TIP 10FT TU (MISCELLANEOUS) IMPLANT

## 2020-01-20 NOTE — Interval H&P Note (Signed)
History and Physical Interval Note:  01/20/2020 12:05 PM  Lisa Jenkins  has presented today for surgery, with the diagnosis of PELVIC MASS, ELEVATED CA 125.  The various methods of treatment have been discussed with the patient and family. After consideration of risks, benefits and other options for treatment, the patient has consented to  Procedure(s): XI ROBOTIC ASSISTED TOTAL HYSTERECTOMY WITH BILATERAL SALPINGO OOPHORECTOMY, MINI LAPAROTOMY POSSIBLE STAGING,POSSIBLE LAPAROTOMY (Bilateral) as a surgical intervention.  The patient's history has been reviewed, patient examined, no change in status, stable for surgery.  I have reviewed the patient's chart and labs.  Questions were answered to the patient's satisfaction.     Thereasa Solo

## 2020-01-20 NOTE — Anesthesia Postprocedure Evaluation (Signed)
Anesthesia Post Note  Patient: Lisa Jenkins  Procedure(s) Performed: XI ROBOTIC ASSISTED TOTAL HYSTERECTOMY, UTERUS GREATER THAN 250 GRAMS WITH BILATERAL SALPINGO OOPHORECTOMY, MINI LAPAROTOMY (Bilateral )     Patient location during evaluation: PACU Anesthesia Type: General Level of consciousness: awake and alert Pain management: pain level controlled Vital Signs Assessment: post-procedure vital signs reviewed and stable Respiratory status: spontaneous breathing, nonlabored ventilation, respiratory function stable and patient connected to nasal cannula oxygen Cardiovascular status: blood pressure returned to baseline and stable Postop Assessment: no apparent nausea or vomiting Anesthetic complications: no    Last Vitals:  Vitals:   01/20/20 1849 01/20/20 1956  BP: (!) 160/63 (!) 161/66  Pulse: 60 60  Resp: 18 18  Temp:  36.9 C  SpO2: 99% 98%    Last Pain:  Vitals:   01/20/20 1956  TempSrc: Oral  PainSc:                  [name] S

## 2020-01-20 NOTE — Anesthesia Preprocedure Evaluation (Addendum)
Anesthesia Evaluation  Patient identified by MRN, date of birth, ID band Patient awake    Reviewed: Allergy & Precautions, NPO status , Patient's Chart, lab work & pertinent test results  Airway Mallampati: II  TM Distance: >3 FB Neck ROM: Full    Dental  (+) Edentulous Upper, Edentulous Lower   Pulmonary neg pulmonary ROS, Current SmokerPatient did not abstain from smoking.,    Pulmonary exam normal breath sounds clear to auscultation       Cardiovascular hypertension, + angina + CAD, + Past MI (2018) and + Cardiac Stents (2018 RCA)  Normal cardiovascular exam Rhythm:Regular Rate:Normal  EKG: 01/12/2020 Rate 74 bpm Sinus rhythm  Low voltage, precordial leads   Cardiac Cath 07/09/2017 High-grade obstruction of the mid right coronary serving as the culprit for the patient's presentation with acute coronary syndrome. Successful drug-eluting stent implantation in the mid right coronary decreasing tubular segmental 90% stenosis to 0% with TIMI grade III flow using a 3.0 x 28 mm Synergy post dilating to 3.5 mm in diameter. Widely patent left main, and circumflex. Eccentric 50% proximal to mid LAD after the first diagonal. Overall normal LV function. EF estimated to be 55% with normal filling pressures. RECOMMENDATIONS: Aspirin and Plavix for 12 months.   Neuro/Psych negative neurological ROS  negative psych ROS   GI/Hepatic negative GI ROS, Neg liver ROS,   Endo/Other  diabetes, Type 2Hypothyroidism   Renal/GU negative Renal ROS  negative genitourinary   Musculoskeletal  (+) Arthritis ,   Abdominal   Peds  Hematology  (+) Blood dyscrasia (Hgb 10.3), anemia ,   Anesthesia Other Findings Recent hospitalization 5/8-5/06/2020 for hyperkalemia in the context of newly diagnosed pelvic mass  Reproductive/Obstetrics                            Anesthesia Physical Anesthesia Plan  ASA:  III  Anesthesia Plan: General   Post-op Pain Management:    Induction: Intravenous  PONV Risk Score and Plan: 2 and Midazolam, Dexamethasone and Ondansetron  Airway Management Planned: Oral ETT  Additional Equipment:   Intra-op Plan:   Post-operative Plan: Extubation in OR  Informed Consent: I have reviewed the patients History and Physical, chart, labs and discussed the procedure including the risks, benefits and alternatives for the proposed anesthesia with the patient or authorized representative who has indicated his/her understanding and acceptance.     Dental advisory given  Plan Discussed with: CRNA  Anesthesia Plan Comments:         Anesthesia Quick Evaluation

## 2020-01-20 NOTE — Op Note (Signed)
OPERATIVE NOTE 01/20/20  Surgeon: Donaciano Eva   Assistants: Dr Lahoma Crocker (an MD assistant was necessary for tissue manipulation, management of robotic instrumentation, retraction and positioning due to the complexity of the case and hospital policies).   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 Grossly normal ovaries and tubes. Highly vascular tumor. Normal omentum and upper abdomen. No gross extrauterine disease other than the pelvic tumor adhesions to the uterus.   Estimated Blood Loss:  250cc      Total IV Fluids: 800 ml         Specimens: uterus, cervix, bilateral tubes and ovaries, washings         Complications:  None; patient tolerated the procedure well.         Disposition: PACU - hemodynamically stable.  Procedure Details  The patient was seen in the Holding Room. The risks, benefits, complications, treatment options, and expected outcomes were discussed with the patient.  The patient concurred with the proposed plan, giving informed consent.  The site of surgery properly noted/marked. The patient was identified as Lisa Jenkins and the procedure verified as a Robotic-assisted hysterectomy with bilateral salpingo oophorectomy. A Time Out was held and the above information confirmed.  After induction of anesthesia, the patient was draped and prepped in the usual sterile manner. Pt was placed in  supine position after anesthesia and draped and prepped in the usual sterile manner. The abdominal drape was placed after the CholoraPrep had been allowed to dry for 3 minutes.  Her arms were tucked to her side with all appropriate precautions.  The shoulders were stabilized with padded shoulder blocks applied to the acromium processes.  The patient was placed in the semi-lithotomy position in [place].  The perineum was prepped with Betadine. The patient was then prepped. Foley catheter was placed.  A sterile speculum was placed in the vagina.  The cervix was grasped with a single-tooth tenaculum. The cervix was dilated with Kennon Rounds dilators.  The ZUMI uterine manipulator with a medium colpotomizer ring was placed without difficulty.  A pneum occluder balloon was placed over the manipulator.  OG tube placement was confirmed and to suction.   Next, a 5 mm skin incision was made 1 cm below the subcostal margin in the midclavicular line.  The 5 mm Optiview port and scope was used for direct entry.  Opening pressure was under 10 mm CO2.  The abdomen was insufflated and the findings were noted as above.   At this point and all points during the procedure, the patient's intra-abdominal pressure did not exceed 15 mmHg. Next, a 10 mm skin incision was made in the umbilicus and a right and left port was placed about 10 cm lateral to the robot port on the right and left side.  A fourth arm was placed in the left lower quadrant 2 cm above and superior and medial to the anterior superior iliac spine.  All ports were placed under direct visualization.  The patient was placed in steep Trendelenburg.  Bowel was folded away into  the upper abdomen.  The robot was docked in the normal manner.  The hysterectomy was started after the round ligament on the right side was incised and the retroperitoneum was entered and the pararectal space was developed.  The ureter was noted to be on the medial leaf of the broad ligament.  The  peritoneum above the ureter was incised and stretched and the infundibulopelvic ligament was skeletonized, cauterized and cut.  Manipulation of the uterus and its erupting tumors caused brisk bleeding therefore a decision was made to skeletonize the uterine arteries at their origin in the retroperitoneum in order to decrease pulse pressure and perfusion to the uterus. The posterior peritoneum was taken down to the level of the KOH ring.  The anterior peritoneum was also taken down.  The bladder flap was created to the level of the KOH ring.  The uterine artery on the right side was skeletonized, cauterized and cut in the normal manner.  A similar procedure was performed on the left.  Posteriorly the tumor erupting from the uterus was adherent to the sigmoid colon mesentery. This was sharply dissected free. The colpotomy was made and the uterus, cervix, bilateral ovaries and tubes were amputated and placed in an endocatch bag.  Pedicles were inspected and surgiflow was used to reinforce hemostasis in the posterior cul de sac where the tumor had been adherent.   The colpotomy at the vaginal cuff was closed with Vicryl on a CT1 needle in a running manner.  Irrigation was used and excellent hemostasis was achieved.  At this point in the robotic procedure was completed.  Robotic instruments were removed under direct visulaization.  The robot was undocked.   A 6cm suprapubic low transverse incision was made with the scalpel. The subcutaneous skin was opened with the bovie. The fascia was opened with the bovie transversely and the rectus muscles were dissected off of the fascia inferiorally and superiorally. The peritoneum was opened sharply in the midline. The peritoneal incision was extended. The uterine specimen in the endocatch bag was retrieved through the incision. The fascia was closed with running 0-vicryl. The subcutaneous fat was closed with 2-0 vicryl. 20cc of exparel mixed with 20cc of marcaine was  infiltrated into the incision. The incision was closed at the skin with monocryl and dermabond.   The 10 mm ports were closed with Vicryl on a UR-5 needle and the fascia was closed with 0 Vicryl on a UR-5 needle.  The skin was closed with 4-0 Vicryl in a subcuticular manner.  Dermabond was applied.  Sponge, lap and needle counts correct x 2.  The patient was taken to the recovery room in stable condition.  The vagina was swabbed with  minimal bleeding noted.   All instrument and needle counts were correct x  3.   The patient was transferred to the recovery room in a stable condition.  Donaciano Eva, MD

## 2020-01-20 NOTE — Transfer of Care (Signed)
Immediate Anesthesia Transfer of Care Note  Patient: ROYCE PAWLOWICZ  Procedure(s) Performed: XI ROBOTIC ASSISTED TOTAL HYSTERECTOMY, UTERUS GREATER THAN 250 GRAMS WITH BILATERAL SALPINGO OOPHORECTOMY, MINI LAPAROTOMY (Bilateral )  Patient Location: PACU  Anesthesia Type:General  Level of Consciousness: awake  Airway & Oxygen Therapy: Patient Spontanous Breathing and Patient connected to nasal cannula oxygen  Post-op Assessment: Report given to RN, Post -op Vital signs reviewed and stable and Patient moving all extremities X 4  Post vital signs: Reviewed and stable  Last Vitals:  Vitals Value Taken Time  BP    Temp    Pulse 57 01/20/20 1529  Resp 21 01/20/20 1529  SpO2 100 % 01/20/20 1529  Vitals shown include unvalidated device data.  Last Pain:  Vitals:   01/20/20 1111  TempSrc:   PainSc: 5       Patients Stated Pain Goal: 4 ([id number])  Complications: No apparent anesthesia complications

## 2020-01-20 NOTE — Anesthesia Procedure Notes (Signed)
Procedure Name: Intubation Date/Time: 01/20/2020 12:58 PM Performed by: Montel Clock, CRNA Pre-anesthesia Checklist: Patient identified, Emergency Drugs available, Suction available and Patient being monitored Patient Re-evaluated:Patient Re-evaluated prior to induction Oxygen Delivery Method: Circle system utilized and Simple face mask Preoxygenation: Pre-oxygenation with 100% oxygen Induction Type: IV induction Ventilation: Mask ventilation without difficulty Laryngoscope Size: Mac and 3 Grade View: Grade I Tube type: Oral Tube size: 7.0 mm Number of attempts: 1 Airway Equipment and Method: Stylet Placement Confirmation: ETT inserted through vocal cords under direct vision,  positive ETCO2 and breath sounds checked- equal and bilateral Secured at: 21 cm Tube secured with: Tape Dental Injury: Teeth and Oropharynx as per pre-operative assessment  Comments: Airway done by Prudy Feeler, SRNA under supervision of Dr. Gregor Hams, CRNA

## 2020-01-21 ENCOUNTER — Telehealth: Payer: Self-pay

## 2020-01-21 DIAGNOSIS — C542 Malignant neoplasm of myometrium: Secondary | ICD-10-CM | POA: Diagnosis not present

## 2020-01-21 LAB — CBC
HCT: 29 % — ABNORMAL LOW (ref 36.0–46.0)
Hemoglobin: 9.2 g/dL — ABNORMAL LOW (ref 12.0–15.0)
MCH: 30.5 pg (ref 26.0–34.0)
MCHC: 31.7 g/dL (ref 30.0–36.0)
MCV: 96 fL (ref 80.0–100.0)
Platelets: 336 10*3/uL (ref 150–400)
RBC: 3.02 MIL/uL — ABNORMAL LOW (ref 3.87–5.11)
RDW: 12.8 % (ref 11.5–15.5)
WBC: 20 10*3/uL — ABNORMAL HIGH (ref 4.0–10.5)
nRBC: 0 % (ref 0.0–0.2)

## 2020-01-21 LAB — BASIC METABOLIC PANEL
Anion gap: 9 (ref 5–15)
BUN: 23 mg/dL (ref 8–23)
CO2: 23 mmol/L (ref 22–32)
Calcium: 8.3 mg/dL — ABNORMAL LOW (ref 8.9–10.3)
Chloride: 100 mmol/L (ref 98–111)
Creatinine, Ser: 1.02 mg/dL — ABNORMAL HIGH (ref 0.44–1.00)
GFR calc Af Amer: >60 mL/min (ref >60)
GFR calc non Af Amer: 57 mL/min — ABNORMAL LOW (ref >60)
Glucose, Bld: 301 mg/dL — ABNORMAL HIGH (ref 70–99)
Potassium: 5.4 mmol/L — ABNORMAL HIGH (ref 3.5–5.1)
Sodium: 132 mmol/L — ABNORMAL LOW (ref 135–145)

## 2020-01-21 LAB — GLUCOSE, CAPILLARY
Glucose-Capillary: 281 mg/dL — ABNORMAL HIGH (ref 70–99)
Glucose-Capillary: 416 mg/dL — ABNORMAL HIGH (ref 70–99)
Glucose-Capillary: 216 mg/dL — ABNORMAL HIGH (ref 70–99)
Glucose-Capillary: 68 mg/dL — ABNORMAL LOW (ref 70–99)

## 2020-01-21 LAB — GLUCOSE, RANDOM: Glucose, Bld: 373 mg/dL — ABNORMAL HIGH (ref 70–99)

## 2020-01-21 MED ORDER — METFORMIN HCL 500 MG PO TABS
1000.0000 mg | ORAL_TABLET | Freq: Two times a day (BID) | ORAL | Status: DC
  Administered 2020-01-21: 1000 mg via ORAL
  Filled 2020-01-21: qty 2

## 2020-01-21 MED ORDER — SENNOSIDES-DOCUSATE SODIUM 8.6-50 MG PO TABS
2.0000 | ORAL_TABLET | Freq: Every day | ORAL | 0 refills | Status: DC
Start: 2020-01-21 — End: 2020-05-03

## 2020-01-21 MED ORDER — INSULIN ASPART 100 UNIT/ML ~~LOC~~ SOLN
15.0000 [IU] | Freq: Once | SUBCUTANEOUS | Status: AC
  Administered 2020-01-21: 15 [IU] via SUBCUTANEOUS

## 2020-01-21 MED ORDER — LIVING WELL WITH DIABETES BOOK
Freq: Once | Status: AC
  Filled 2020-01-21: qty 1

## 2020-01-21 MED ORDER — TRAMADOL HCL 50 MG PO TABS: 50.0000 mg | ORAL_TABLET | Freq: Four times a day (QID) | ORAL | 0 refills | Status: DC | PRN

## 2020-01-21 NOTE — Discharge Summary (Addendum)
Physician Discharge Summary  Patient ID: Lisa Jenkins MRN: [id number] DOB/AGE: 67-Jan-1952 67 y.o.  Admit date: 01/20/2020 Discharge date: 01/21/2020  Admission Diagnoses: Pelvic mass in female  Discharge Diagnoses:  Principal Problem:   Pelvic mass in female Active Problems:   Uncontrolled type 2 diabetes mellitus with complication ([place])   Tobacco use   History of coronary artery stent placement   Uterine sarcoma [facility])   Discharged Condition:  The patient is in good condition and stable for discharge.    Hospital Course: On 01/20/2020, the patient underwent the following: Procedure(s): XI ROBOTIC ASSISTED TOTAL HYSTERECTOMY, UTERUS GREATER THAN 250 GRAMS WITH BILATERAL SALPINGO OOPHORECTOMY, MINI LAPAROTOMY. The postoperative course was uneventful except for episodes of hyperglycemia related to her uncontrolled diabetes, surgical stress,medications,etc.  She was seen by the Diabetes Coordinator prior to discharge. She was discharged to home on postoperative day 1 tolerating a regular diet, voiding, pain controlled, increasing ambulation, passing flatus. She is to continue monitoring her blood glucose at home and continue her home diabetic regimen until seen by her PCP shortly after discharge.  Consults: None  Significant Diagnostic Studies: Labs  Treatments: surgery: see above  Discharge Exam: Blood pressure (!) 141/58, pulse 69, temperature 98.1 F (36.7 C), temperature source Oral, resp. rate 17, height 5\' 3"  (1.6 m), weight 140 lb (63.5 kg), SpO2 96 %. General appearance: alert, cooperative and no distress Resp: clear to auscultation bilaterally Cardio: regular rate and rhythm, S1, S2 normal, no murmur, click, rub or gallop GI: soft, non-tender; bowel sounds normal; no masses,  no organomegaly Extremities: extremities normal, atraumatic, no cyanosis or edema Incision/Wound: lap sites to the abdomen with dermabond without drainage, transverse incision with op site dressing in  place with no drainage noted underneath  Disposition: Discharge disposition: 01-Home or Self Care       Discharge Instructions    Call MD for:  difficulty breathing, headache or visual disturbances   Complete by: As directed    Call MD for:  extreme fatigue   Complete by: As directed    Call MD for:  hives   Complete by: As directed    Call MD for:  persistant dizziness or light-headedness   Complete by: As directed    Call MD for:  persistant nausea and vomiting   Complete by: As directed    Call MD for:  redness, tenderness, or signs of infection (pain, swelling, redness, odor or green/yellow discharge around incision site)   Complete by: As directed    Call MD for:  severe uncontrolled pain   Complete by: As directed    Call MD for:  temperature >100.4   Complete by: As directed    Diet - low sodium heart healthy   Complete by: As directed    Driving Restrictions   Complete by: As directed    No driving for 1 week at minimum if you were cleared to drive before surgery.  Do not take narcotics and drive.   Increase activity slowly   Complete by: As directed    Lifting restrictions   Complete by: As directed    No lifting greater than 10 lbs.   Sexual Activity Restrictions   Complete by: As directed    No sexual activity, nothing in the vagina, for 8 weeks.     Allergies as of 01/21/2020   No Known Allergies     Medication List    TAKE these medications   acetaminophen 325 MG tablet Commonly known as: TYLENOL  Take 2 tablets (650 mg total) by mouth every 6 (six) hours.   allopurinol 100 MG tablet Commonly known as: ZYLOPRIM Take 1 tablet (100 mg total) by mouth daily.   aspirin 81 MG chewable tablet Chew 81 mg by mouth daily.   atorvastatin 80 MG tablet Commonly known as: LIPITOR TAKE 1 Tablet BY MOUTH ONCE DAILY AT 6:00PM What changed:   how much to take  how to take this  when to take this   chlorthalidone 25 MG tablet Commonly known as:  HYGROTON Take 1 tablet (25 mg total) by mouth daily.   metFORMIN 1000 MG tablet Commonly known as: GLUCOPHAGE TAKE 1 Tablet  BY MOUTH TWICE DAILY WITH MEALS   metoprolol tartrate 25 MG tablet Commonly known as: LOPRESSOR TAKE 1 Tablet  BY MOUTH TWICE DAILY   Ozempic (1 MG/DOSE) 2 MG/1.5ML Sopn Generic drug: Semaglutide (1 MG/DOSE) Inject 1 mg into the skin every Thursday.   senna-docusate 8.6-50 MG tablet Commonly known as: Senokot-S Take 2 tablets by mouth at bedtime. Take to prevent constipation, do not take if having diarrhea   Synthroid 25 MCG tablet Generic drug: levothyroxine TAKE 1 Tablet BY MOUTH ONCE DAILY BEFORE BREAKFAST What changed: See the new instructions.   traMADol 50 MG tablet Commonly known as: ULTRAM Take 1 tablet (50 mg total) by mouth every 6 (six) hours as needed for severe pain. Do not take and drive   Tresiba FlexTouch 100 UNIT/ML FlexTouch Pen Generic drug: insulin degludec Inject 0.1 mLs (10 Units total) into the skin every morning.   Vitamin D (Ergocalciferol) 1.25 MG (50000 UNIT) Caps capsule Commonly known as: DRISDOL Take 50,000 Units by mouth once a week.      Follow-up Information    Herminio Commons, MD .   Specialty: Cardiology Contact information: [place] [address] [phone]        Denyce Robert, [place] .   Specialty: Family Medicine Contact information: [place] [address] [phone]        Everitt Amber, MD Follow up on 02/11/2020.   Specialty: Gynecologic Oncology Why: at 11:45am at the [facility] information: [address] [place] [address] [phone]           Greater than thirty minutes were spend for face to face discharge instructions and discharge orders/summary in EPIC.   Signed: Dorothyann Gibbs 01/21/2020, 3:36 PM

## 2020-01-21 NOTE — Progress Notes (Signed)
Stat serum glucose ordered at 12:15 pm due to CBG of 416.  At 12:51, noted as in process.  Results have not returned as of 13:30 pm. Plan to move forward with administering 15 units of novolog per Dr. Berline Lopes.  RN notified.

## 2020-01-21 NOTE — Progress Notes (Signed)
Pt alert and oriented. Tolerating diet. Ambulated in hall. D/C instructions given. Pt d/cd to home.

## 2020-01-21 NOTE — TOC Initial Note (Signed)
Transition of Care [facility]) - Initial/Assessment Note    Patient Details  Name: Lisa Jenkins MRN: [id number] Date of Birth: [birth year]  Transition of Care [facility]) CM/SW Contact:    Lennart Pall, LCSW Phone Number: 01/21/2020, 2:26 PM  Clinical Narrative:    Met with pt and friend today to review dc needs. Pt requesting assistance with securing [place] aide as she does have Medicaid coverage and feels she needs additional support at home (sister has a broken shoulder).  Have contacted pt's PCP office and sent over [place] application.  Await it's return and will submit - hope to complete this process prior to pt's dc.  Have stressed to pt that it could take a couple of weeks before these services may begin.  Continue to follow.               Expected Discharge Plan: [place] Barriers to Discharge: Continued Medical Work up   Patient Goals and CMS Choice Patient states their goals for this hospitalization and ongoing recovery are:: "go home when I'm ready"      Expected Discharge Plan and Services Expected Discharge Plan: [place] In-house Referral: Clinical Social Work     Living arrangements for the past 2 months: [place]                                      Prior Living Arrangements/Services Living arrangements for the past 2 months: [place] Lives with:: Siblings Patient language and need for interpreter reviewed:: Yes Do you feel safe going back to the place where you live?: Yes      Need for Family Participation in Patient Care: Yes (Comment) Care giver support system in place?: Yes (comment)   Criminal Activity/Legal Involvement Pertinent to Current Situation/Hospitalization: No - Comment as needed  Activities of Daily Living Home Assistive Devices/Equipment: Eyeglasses, Dentures (specify type), Blood pressure cuff, CBG Meter, Walker (specify type), Cane (specify quad or straight)(full  dentures) ADL Screening (condition at time of admission) Patient's cognitive ability adequate to safely complete daily activities?: Yes Is the patient deaf or have difficulty hearing?: No Does the patient have difficulty seeing, even when wearing glasses/contacts?: No Does the patient have difficulty concentrating, remembering, or making decisions?: No Patient able to express need for assistance with ADLs?: Yes Does the patient have difficulty dressing or bathing?: Yes Independently performs ADLs?: Yes (appropriate for developmental age) Does the patient have difficulty walking or climbing stairs?: Yes Weakness of Legs: Both Weakness of Arms/Hands: Both  Permission Sought/Granted Permission sought to share information with : Family Supports Permission granted to share information with : Yes, Verbal Permission Granted  Share Information with NAME: Florene Glen     Permission granted to share info w Relationship: sister  Permission granted to share info w Contact Information: [phone]  Emotional Assessment Appearance:: Appears older than stated age Attitude/Demeanor/Rapport: Sedated Affect (typically observed): Quiet Orientation: : Oriented to Place, Oriented to  Time, Oriented to Situation, Oriented to Self Alcohol / Substance Use: Not Applicable Psych Involvement: No (comment)  Admission diagnosis:  Pelvic mass in female [R19.00] Uterine sarcoma [facility]) [C54.9] Patient Active Problem List   Diagnosis Date Noted  . Pelvic mass in female 01/20/2020  . Uterine sarcoma ([place]) 01/20/2020  . Preoperative clearance   . Coronary artery disease of native artery of native heart with stable angina pectoris (  [place])   . Essential hypertension   . Hyperlipidemia LDL goal <70   . Tobacco use   . History of coronary artery stent placement   . Leukocytosis 01/10/2020  . AKI (acute kidney injury) ([place]) 01/10/2020  . Hyperkalemia 01/08/2020  . Pelvic mass 01/08/2020  . NSTEMI (non-ST elevated  myocardial infarction) ([place]) 07/08/2017  . Chest pain 07/28/2016  . 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 Hypothyroidism 10/21/2015  . Uncontrolled type 2 diabetes mellitus with complication ([place]) 69/22/3009  . Type 2 diabetes mellitus with complication ([place]) 79/49/9718  . Hyperlipidemia 07/21/2015  . Essential hypertension, benign 07/21/2015  . Cigarette nicotine dependence, uncomplicated 20/99/0689  . Type 2 diabetes mellitus with retinopathy ([place]) 07/21/2015  . Proteinuria 07/21/2015  . Thyroid activity decreased 07/21/2015   PCP:  Denyce Robert, FNP Pharmacy:   [place], [place] - [place] [address] [place] [address] Phone: [phone] Fax: [phone]     Social Determinants of Health (SDOH) Interventions    Readmission Risk Interventions No flowsheet data found.

## 2020-01-21 NOTE — Progress Notes (Signed)
1 Day Post-Op Procedure(s) (LRB): XI ROBOTIC ASSISTED TOTAL HYSTERECTOMY, UTERUS GREATER THAN 250 GRAMS WITH BILATERAL SALPINGO OOPHORECTOMY, MINI LAPAROTOMY (Bilateral)  Subjective: Patient reports sleeping through the night.  Tolerated diet with no nausea or emesis reported.  States she thinks she is voiding well.  Pain controlled with ordered medications. Passing flatus. No concerns voiced.    Objective: Vital signs in last 24 hours: Temp:  [97.6 F (36.4 C)-98.7 F (37.1 C)] 98 F (36.7 C) (05/19 0944) Pulse Rate:  [49-80] 79 (05/19 0944) Resp:  [14-19] 17 (05/19 0944) BP: (148-197)/(57-86) 172/67 (05/19 0944) SpO2:  [91 %-100 %] 95 % (05/19 0944) Weight:  [140 lb (63.5 kg)] 140 lb (63.5 kg) (05/18 1100) Last BM Date: 01/19/20  Intake/Output from previous day: 05/18 0701 - 05/19 0700 In: 1015.7 [P.O.:170; I.V.:745.7; IV Piggyback:100] Out: 251 [Urine:1; Blood:250]  Physical Examination: General: alert, cooperative and no distress Resp: clear to auscultation bilaterally Cardio: regular rate and rhythm, S1, S2 normal, no murmur, click, rub or gallop GI: soft, non-tender; bowel sounds normal; no masses,  no organomegaly and incision: lap site incisions with dermabond without active drainage Extremities: extremities normal, atraumatic, no cyanosis or edema  Labs: WBC/Hgb/Hct/Plts:  20.0/9.2/29.0/336 (05/19 0416) BUN/Cr/glu/ALT/AST/amyl/lip:  23/1.02/--/--/--/--/-- (05/19 0416)  Assessment: 69 y.o. s/p Procedure(s): XI ROBOTIC ASSISTED TOTAL HYSTERECTOMY, UTERUS GREATER THAN 250 GRAMS WITH BILATERAL SALPINGO OOPHORECTOMY, MINI LAPAROTOMY: stable Pain:  Pain is well-controlled on ordered and PRN medications.  Heme: Hgb 9.2 and Hct 29.0 this am.  CV: BP and HR stable. Continue to monitor with ordered vital signs.  GI:  Tolerating po: Yes. Antiemetics ordered PRN.  GU: Adequate output reported. Creatinine 1.02 this am (baseline).    FEN: No critical values on Bmet.  Endo:  Diabetes mellitus Type II, under fair control..  CBG:  CBG (last 3)  Recent Labs    01/20/20 1738 01/20/20 2048 01/21/20 0751  GLUCAP 261* 264* 281*   Prophylaxis: lovenox and SCDs ordered  Plan: Plan to reassess patient later this am for possible discharge Continue monitoring blood glucose levels IS opened and instructed on IS Plan for possible discharge later today. No home health needed.   LOS: 0 days    Lisa Jenkins 01/21/2020, 10:48 AM

## 2020-01-21 NOTE — Progress Notes (Signed)
Inpatient Diabetes Program Recommendations  AACE/ADA: New Consensus Statement on Inpatient Glycemic Control (2015)  Target Ranges:  Prepandial:   less than 140 mg/dL      Peak postprandial:   less than 180 mg/dL (1-2 hours)      Critically ill patients:  140 - 180 mg/dL   Lab Results  Component Value Date   GLUCAP 416 (H) 01/21/2020   HGBA1C 8.7 (H) 01/10/2020    Review of Glycemic Control  Diabetes history: DM2 Outpatient Diabetes medications: Tresiba 10 units QHS, metformin 1000 mg bid, Ozempic 1 mg Q Thursdays Current orders for Inpatient glycemic control: Lantus 10 units QHS, Novolog 0-15 units tidwc and hs  HgbA1C - 8.7% Ordered Living Well with Diabetes  Inpatient Diabetes Program Recommendations:     Check blood sugars at least 3x/day. Call PCP if blood sugars are < 70 mg/dL or above 180 mg/dL on a regular basis.  Long discussion with pt about importance of controlling blood sugars at home. Discussed HgbA1C of 8.7% and goal of 7%. Pt states she has hypoglycemia "sometimes" in the morning and MD has reduced Tresiba from 20 units to 10 units QHS. Reviewed hypoglycemia s/s and treatment. Encouraged pt to get glucose tablets and keep at bedside. Also discussed drinking 1/2 c juice or regular soda, then rechecking in 15 min. Reviewed diet, focusing on eating 3 meals and snack with reduced simple CHOs and try to decrease salt intake. Discussed impact of nutrition, exercise, stress, sickness, and medications on diabetes control. Ordered Living Well with diabetes booklet and encouraged patient to read through entire book. Answered all questions. Pt appreciative of visit.  Discussed above with RN.  Thank you. Lorenda Peck, RD, LDN, CDE Inpatient Diabetes Coordinator [phone]

## 2020-01-21 NOTE — Discharge Instructions (Signed)
01/20/2020  Return to work: 4 weeks  Activity: 1. Be up and out of the bed during the day.  Take a nap if needed.  You may walk up steps but be careful and use the hand rail.  Stair climbing will tire you more than you think, you may need to stop part way and rest.   2. No lifting or straining for 6 weeks.  3. No driving for 1 week.  Do Not drive if you are taking narcotic pain medicine.  4. Shower daily.  Use soap and water on your incision and pat dry; don't rub. No tub baths or swimming pools or submerging your body in water until seen in the office.   5. No sexual activity and nothing in the vagina for 8 weeks.  Medications:  - Take tylenol first line for pain control. Take these regularly (every 6 hours) to decrease the build up of pain.  - If necessary, for severe pain not relieved by tylenol, take tramadol.  - While taking tramadol you should take sennakot every night to reduce the likelihood of constipation. If this causes diarrhea, stop its use.  Diet: 1. Low sodium Heart Healthy Diet is recommended.  2. It is safe to use a laxative if you have difficulty moving your bowels.   Wound Care: 1. Keep clean and dry.  Shower daily. 2. You can get the dressing wet in the shower, but avoid tub baths. 3. Remove the dressing between 5 and 7 days after your surgery (1 week after your operation). 4. After the dressing is removed you can get the incision wet in the shower, however continue to avoid tub baths until advised otherwise by your surgeon at follow-up.   Reasons to call the Doctor:   Fever - Oral temperature greater than 100.4 degrees Fahrenheit  Foul-smelling vaginal discharge  Difficulty urinating  Nausea and vomiting  Increased pain at the site of the incision that is unrelieved with pain medicine.  Difficulty breathing with or without chest pain  New calf pain especially if only on one side  Sudden, continuing increased vaginal bleeding with or without  clots.   Follow-up: 1. See Everitt Amber in 3 weeks.  Contacts: For questions or concerns you should contact:  Dr. Everitt Amber at [phone] After hours and on week-ends call [phone] and ask to speak to the physician on call for Gynecologic Oncology  Tramadol tablets  What is this medicine? TRAMADOL (TRA ma dole) is a pain reliever. It is used to treat moderate to severe pain in adults. This medicine may be used for other purposes; ask your health care provider or pharmacist if you have questions. COMMON BRAND NAME(S): Ultram What should I tell my health care provider before I take this medicine? They need to know if you have any of these conditions:  brain tumor  depression  drug abuse or addiction  head injury  if you frequently drink alcohol containing drinks  kidney disease or trouble passing urine  liver disease  lung disease, asthma, or breathing problems  seizures or epilepsy  suicidal thoughts, plans, or attempt; a previous suicide attempt by you or a family member  an unusual or allergic reaction to tramadol, codeine, other medicines, foods, dyes, or preservatives  pregnant or trying to get pregnant  breast-feeding How should I use this medicine? Take this medicine by mouth with a full glass of water. Follow the directions on the prescription label. You can take it with or without  food. If it upsets your stomach, take it with food. Do not take your medicine more often than directed. A special MedGuide will be given to you by the pharmacist with each prescription and refill. Be sure to read this information carefully each time. Talk to your pediatrician regarding the use of this medicine in children. Special care may be needed. Overdosage: If you think you have taken too much of this medicine contact a poison control center or emergency room at once. NOTE: This medicine is only for you. Do not share this medicine with others. What if I miss a dose? If  you miss a dose, take it as soon as you can. If it is almost time for your next dose, take only that dose. Do not take double or extra doses. What may interact with this medicine? Do not take this medication with any of the following medicines:  MAOIs like Carbex, Eldepryl, Marplan, Nardil, and Parnate This medicine may also interact with the following medications:  alcohol  antihistamines for allergy, cough and cold  certain medicines for anxiety or sleep  certain medicines for depression like amitriptyline, fluoxetine, sertraline  certain medicines for migraine headache like almotriptan, eletriptan, frovatriptan, naratriptan, rizatriptan, sumatriptan, zolmitriptan  certain medicines for seizures like carbamazepine, oxcarbazepine, phenobarbital, primidone  certain medicines that treat or prevent blood clots like warfarin  digoxin  furazolidone  general anesthetics like halothane, isoflurane, methoxyflurane, propofol  linezolid  local anesthetics like lidocaine, pramoxine, tetracaine  medicines that relax muscles for surgery  other narcotic medicines for pain or cough  phenothiazines like chlorpromazine, mesoridazine, prochlorperazine, thioridazine  procarbazine This list may not describe all possible interactions. Give your health care provider a list of all the medicines, herbs, non-prescription drugs, or dietary supplements you use. Also tell them if you smoke, drink alcohol, or use illegal drugs. Some items may interact with your medicine. What should I watch for while using this medicine? Tell your health care provider if your pain does not go away, if it gets worse, or if you have new or a different type of pain. You may develop tolerance to this drug. Tolerance means that you will need a higher dose of the drug for pain relief. Tolerance is normal and is expected if you take this drug for a long time. Do not suddenly stop taking your drug because you may develop a  severe reaction. Your body becomes used to the drug. This does NOT mean you are addicted. Addiction is a behavior related to getting and using a drug for a nonmedical reason. If you have pain, you have a medical reason to take pain drug. Your health care provider will tell you how much drug to take. If your health care provider wants you to stop the drug, the dose will be slowly lowered over time to avoid any side effects. If you take other drugs that also cause drowsiness like other narcotic pain drugs, benzodiazepines, or other drugs for sleep, you may have more side effects. Give your health care provider a list of all drugs you use. He or she will tell you how much drug to take. Do not take more drug than directed. Get emergency help right away if you have trouble breathing or are unusually tired or sleepy. Talk to your health care provider about naloxone and how to get it. Naloxone is an emergency drug used for an opioid overdose. An overdose can happen if you take too much opioid. It can also happen if an opioid  is taken with some other drugs or substances, like alcohol. Know the symptoms of an overdose, like trouble breathing, unusually tired or sleepy, or not being able to respond or wake up. Make sure to tell caregivers and close contacts where it is stored. Make sure they know how to use it. After naloxone is given, you must get emergency help right away. Naloxone is a temporary treatment. Repeat doses may be needed. This drug may cause serious skin reactions. They can happen weeks to months after starting the drug. Contact your health care provider right away if you notice fevers or flu-like symptoms with a rash. The rash may be red or purple and then turn into blisters or peeling of the skin. Or, you might notice a red rash with swelling of the face, lips, or lymph nodes in your neck or under your arms. You may get drowsy or dizzy. Do not drive, use machinery, or do anything that needs mental  alertness until you know how this drug affects you. Do not stand up or sit up quickly, especially if you are an older patient. This reduces the risk of dizzy or fainting spells. Alcohol may interfere with the effect of this drug. Avoid alcoholic drinks. This drug will cause constipation. If you do not have a bowel movement for 3 days, call your health care provider. Your mouth may get dry. Chewing sugarless gum or sucking hard candy and drinking plenty of water may help. Contact your health care provider if the problem does not go away or is severe. What side effects may I notice from receiving this medicine? Side effects that you should report to your doctor or health care professional as soon as possible:  allergic reactions like skin rash, itching or hives, swelling of the face, lips, or tongue  breathing problems  confusion  redness, blistering, peeling or loosening of the skin, including inside the mouth  seizures  signs and symptoms of low blood pressure like dizziness; feeling faint or lightheaded, falls; unusually weak or tired  trouble passing urine or change in the amount of urine Side effects that usually do not require medical attention (report to your doctor or health care professional if they continue or are bothersome):  constipation  dry mouth  nausea, vomiting  tiredness This list may not describe all possible side effects. Call your doctor for medical advice about side effects. You may report side effects to FDA at 1-800-FDA-1088. Where should I keep my medicine? Keep out of the reach of children. This medicine may cause accidental overdose and death if it taken by other adults, children, or pets. Mix any unused medicine with a substance like cat litter or coffee grounds. Then throw the medicine away in a sealed container like a sealed bag or a coffee can with a lid. Do not use the medicine after the expiration date. Store at room temperature between 15 and 30  degrees C (59 and 86 degrees F). NOTE: This sheet is a summary. It may not cover all possible information. If you have questions about this medicine, talk to your doctor, pharmacist, or health care provider.  2020 Elsevier/Gold Standard (2019-03-31 13:08:25)

## 2020-01-21 NOTE — Telephone Encounter (Signed)
Call made to patient's PCP concerning high glucose readings after surgery.  Message was taken along with readings to give to PCP.  Nurse assured that any notes, etc. Were always sent to their office.

## 2020-01-22 ENCOUNTER — Telehealth: Payer: Self-pay

## 2020-01-22 DIAGNOSIS — R319 Hematuria, unspecified: Secondary | ICD-10-CM

## 2020-01-22 LAB — URINE CULTURE: Culture: 40000 — AB

## 2020-01-22 LAB — CYTOLOGY - NON PAP

## 2020-01-22 NOTE — Telephone Encounter (Signed)
When you call to check in on patient this am (she was discharged yesterday), can you see what her blood sugars have been running since at home. She is supposed to be checking them three times a day. Also she has a mini laparotomy on low transverse abd with the white honeycomb dressing there. Thanks! Melissa Cross,NP

## 2020-01-22 NOTE — Telephone Encounter (Signed)
Spoke with Lisa Jenkins.She is drinking, eating small, amount of food. She inquired when she could eat a regular diet.  Told her she can start now if her stomach is up to it.  She needs to increase her water intake to 64 oz a day. She is passing gas.  She did not get the senokot-s. She states that she uses MOM for constipation. Told her that would be fine. She has not had a chance to check her BS today.  Last night it was 269. She is using two 500 mg of tylenol every 6 hrs for pain. She just took an tramadol and she just got it from the pharmacy. She knows to remove the honeycomb dressing on Sunday.  Incisions and dressings are D&I. She is aware of her post op appointment and the office number [phone] if she has any questions or concerns.

## 2020-01-22 NOTE — Telephone Encounter (Signed)
LM for patient to call back to the office to see how she is doing since discharge yesterday from the hospital and also to see how her blood sugars are running.

## 2020-01-23 LAB — SURGICAL PATHOLOGY

## 2020-01-23 MED ORDER — NITROFURANTOIN MONOHYD MACRO 100 MG PO CAPS: 100.0000 mg | ORAL_CAPSULE | Freq: Two times a day (BID) | ORAL | 0 refills | Status: DC

## 2020-01-23 NOTE — Telephone Encounter (Signed)
Lisa Jenkins states that her blood sugar yesterday after lunch was 196 and last evening it was 269.  She has not moved bowels yet. She is passing gas. Told her to take the MOM today and repeat it tomorrow if no BM today. Her pain is controlled with tylenol. Pt states that she is not having any urinary symptoms at this time. Told her that the [place] will be sent in to [facility] in [place]. She needs to start today and take all the ATB. Encouraged her to drink at least 64 oz of water a day. Pt verbalized understanding.

## 2020-01-23 NOTE — Telephone Encounter (Signed)
LM for patient to call back to discuss blood sugars and to tell her that she has a UTI from urine culture 01-20-20 and needs ATB sent in.

## 2020-02-06 ENCOUNTER — Other Ambulatory Visit: Payer: Self-pay | Admitting: Student

## 2020-02-11 ENCOUNTER — Encounter: Payer: Self-pay | Admitting: Oncology

## 2020-02-11 ENCOUNTER — Inpatient Hospital Stay: Payer: Medicare HMO | Attending: Gynecologic Oncology | Admitting: Gynecologic Oncology

## 2020-02-11 ENCOUNTER — Encounter: Payer: Self-pay | Admitting: Gynecologic Oncology

## 2020-02-11 ENCOUNTER — Inpatient Hospital Stay: Payer: Medicare HMO

## 2020-02-11 ENCOUNTER — Other Ambulatory Visit: Payer: Self-pay

## 2020-02-11 ENCOUNTER — Encounter (HOSPITAL_COMMUNITY): Payer: Self-pay | Admitting: Gynecologic Oncology

## 2020-02-11 VITALS — BP 130/58 | HR 67 | Temp 98.2°F | Resp 17 | Ht 63.0 in | Wt 135.5 lb

## 2020-02-11 DIAGNOSIS — I10 Essential (primary) hypertension: Secondary | ICD-10-CM | POA: Insufficient documentation

## 2020-02-11 DIAGNOSIS — Z90710 Acquired absence of both cervix and uterus: Secondary | ICD-10-CM | POA: Diagnosis not present

## 2020-02-11 DIAGNOSIS — Z79899 Other long term (current) drug therapy: Secondary | ICD-10-CM | POA: Insufficient documentation

## 2020-02-11 DIAGNOSIS — Z8249 Family history of ischemic heart disease and other diseases of the circulatory system: Secondary | ICD-10-CM | POA: Diagnosis not present

## 2020-02-11 DIAGNOSIS — I252 Old myocardial infarction: Secondary | ICD-10-CM | POA: Diagnosis not present

## 2020-02-11 DIAGNOSIS — Z90722 Acquired absence of ovaries, bilateral: Secondary | ICD-10-CM | POA: Insufficient documentation

## 2020-02-11 DIAGNOSIS — Z833 Family history of diabetes mellitus: Secondary | ICD-10-CM | POA: Insufficient documentation

## 2020-02-11 DIAGNOSIS — Z7984 Long term (current) use of oral hypoglycemic drugs: Secondary | ICD-10-CM | POA: Insufficient documentation

## 2020-02-11 DIAGNOSIS — Z7982 Long term (current) use of aspirin: Secondary | ICD-10-CM | POA: Diagnosis not present

## 2020-02-11 DIAGNOSIS — E119 Type 2 diabetes mellitus without complications: Secondary | ICD-10-CM | POA: Insufficient documentation

## 2020-02-11 DIAGNOSIS — Z9079 Acquired absence of other genital organ(s): Secondary | ICD-10-CM | POA: Insufficient documentation

## 2020-02-11 DIAGNOSIS — Z7189 Other specified counseling: Secondary | ICD-10-CM | POA: Diagnosis not present

## 2020-02-11 DIAGNOSIS — F17210 Nicotine dependence, cigarettes, uncomplicated: Secondary | ICD-10-CM | POA: Diagnosis not present

## 2020-02-11 DIAGNOSIS — C541 Malignant neoplasm of endometrium: Secondary | ICD-10-CM

## 2020-02-11 DIAGNOSIS — C55 Malignant neoplasm of uterus, part unspecified: Secondary | ICD-10-CM | POA: Diagnosis not present

## 2020-02-11 LAB — BASIC METABOLIC PANEL
Anion gap: 12 (ref 5–15)
BUN: 12 mg/dL (ref 8–23)
CO2: 24 mmol/L (ref 22–32)
Calcium: 9.4 mg/dL (ref 8.9–10.3)
Chloride: 106 mmol/L (ref 98–111)
Creatinine, Ser: 0.86 mg/dL (ref 0.44–1.00)
GFR calc Af Amer: >60 mL/min (ref >60)
GFR calc non Af Amer: >60 mL/min (ref >60)
Glucose, Bld: 125 mg/dL — ABNORMAL HIGH (ref 70–99)
Potassium: 4.6 mmol/L (ref 3.5–5.1)
Sodium: 142 mmol/L (ref 135–145)

## 2020-02-11 MED ORDER — TRAMADOL HCL 50 MG PO TABS: 50.0000 mg | ORAL_TABLET | Freq: Four times a day (QID) | ORAL | 0 refills | Status: DC | PRN

## 2020-02-11 NOTE — Progress Notes (Signed)
Lisa Jenkins called back and said she is aware of the appointment with Dr. Raliegh Ip on Monday, 02/16/20.

## 2020-02-11 NOTE — Progress Notes (Signed)
Left a message for [facility] with appointment to see Dr. Delton Coombes on Monday, 02/16/20 at 8:15.

## 2020-02-11 NOTE — Progress Notes (Signed)
Met with Lisa Jenkins and explained my role as Art therapist.  Went over her upcoming appointments and gave her the [employer]  Encouraged her to call with any questions or concerns.

## 2020-02-11 NOTE — Progress Notes (Signed)
Follow-up Note: Gyn-Onc  Consult was requested by Dr. Glo Jenkins for the evaluation of Lisa Jenkins 69 y.o. female  CC:  Chief Complaint  Patient presents with  . Uterine sarcoma ([place])    Post-Op visit    Assessment/Plan:  [name]  is a 69 y.o.  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  MMR normal.  I counseled Lisa Jenkins and her friend regarding her diagnosis.  I explained that the recommended course of treatment for this stage high-grade endometrial cancer was adjuvant chemotherapy for 6 cycles with carboplatin and paclitaxel.  I explained this was associated with a 40% recurrence risk.  She is at high risk for vaginal recurrence and therefore I also recommend vaginal brachytherapy intercalated within her chemotherapy treatments in order to reduce the risk of vaginal site recurrences.  We will obtain a CT of the chest to evaluate for distant metastatic disease.  Referrals have been made to medical oncology and radiation oncology for evaluation and consideration of treatment.  Her case will be discussed at our multidisciplinary conference.  From a postop standpoint she is cleared to assume adjuvant therapy.  HPI: [name] is a 69 year old P0 who was seen in consultation at the request of Dr Lisa Jenkins for evaluation of a pelvic mass and elevated CA 125.  The patient reported a 1 month history of developing lower abdominal generalized pain.  This prompted her to be seen in the emergency department at [facility] on Jan 07, 2020.  At that time a CT scan of the abdomen and pelvis with IV contrast was ordered.  It demonstrated an interval development of an 11 x 8.6 x 8.9 cm heterogeneous cystic and solid pelvic mass predominantly on the left.  The CT scan was unable to identify whether this was uterine or ovarian in origin.  There was no associated ascites, peritoneal carcinomatosis, or lymphadenopathy.  Additional CT findings  included a stable 2.9 cm left adrenal mass, and an exophytic lesion from the posterior lower pole of the right kidney.  There was an additional 11 mm right adrenal gland nodule identified. Ca1 25 was drawn on 01/08/2020 and was elevated at 87.7.  During that hospitalization she experienced acute kidney injury, possibly secondary to administration of IV contrast.  This was associated with hyperkalemia.  This was treated with supportive medical care including IV hydration and her condition returned to normal within 4 days.  The patient's medical history is remarkable for type 2 diabetes mellitus which is poorly controlled.  Hemoglobin A1c during the month of May 2021 was 8.7.  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  Her primary care physician Dr. Quintella Jenkins manages her blood glucose and insulin regimen.  The patient has a coronary vascular disease history of a myocardial infarction at age 49, July 09, 2017.  This was managed at [facility] with coronary angiogram and placement of a stent.  She was never placed on Plavix post procedure however has been on aspirin since that time (81 mg).  Dr. Tamala Julian performed her coronary angioplasty in 2018.  Dr. Bronson Jenkins is her cardiologist in [place].  She saw her her cardiologist for routine visit in October 2020 through a virtual visit.  She reported no concerning symptoms including no shortness of breath, no chest pain, no orthopnea.  No interventions were necessary after that visit.  It is noted  that at the time of her MI in 2018 she underwent an echocardiogram which showed an ejection fraction of 55%.  She has chronic kidney disease as outlined above.  While her creatinine is within normal range at rest, she experienced AKI after administration of IV contrast in May 2021.  Past surgical history is unremarkable with no prior abdominal surgeries.  She has never been pregnant.   She has been married twice.  She has had Pap smears during her reproductive life with no abnormal Pap tests.  She experienced menopause in her 32s.  She denies postmenopausal bleeding.  The patient has a history of tobacco use.  She lives with her sister-in-law.  She does not work outside of the home.  She is of excellent functional status and is ambulatory through the day without assistance.  She is able to climb a flight of stairs without shortness of breath or weakness.  She is independent with activities of daily living.  Interval Hx:  On 01/20/20 she underwent robotic assisted total hysterectomy for uterus greater than 250 g, BSO, mini laparotomy for specimen delivery. Intraoperative findings were significant for a 16 cm globular uterus with smooth masses (not consistent with fibroids) erupting from the posterior surface and adherent to the sigmoid colon mesentery.  Grossly normal ovaries and fallopian tubes.  Highly vascular uterine tumor.  Normal omentum and upper abdomen.  No gross extrauterine disease other than the vascular pelvic tumor adhesions to the uterus. Surgery was complicated by an EBL of 250 cc which is greater than anticipated.  Final pathology revealed carcinosarcoma, spanning 15 cm, tumor involving the full-thickness of the myometrium and involving the uterine serosa.  No ovarian or fallopian tube involvement.  No LVSI identified.  Lymph nodes not examined or removed.  She was determined to have stage IIIa high-grade carcinosarcoma of the uterus and recommendation for adjuvant chemotherapy and vaginal radiation therapy was recommended.  Since surgery she has done overall well with some pain in her incisions with coughing, she denies symptoms of infection.  Current Meds:  Outpatient Encounter Medications as of 02/11/2020  Medication Sig  . acetaminophen (TYLENOL) 325 MG tablet Take 2 tablets (650 mg total) by mouth every 6 (six) hours.  Marland Kitchen allopurinol (ZYLOPRIM) 100 MG tablet Take  1 tablet (100 mg total) by mouth daily.  Marland Kitchen aspirin 81 MG chewable tablet Chew 81 mg by mouth daily.   Marland Kitchen atorvastatin (LIPITOR) 80 MG tablet TAKE 1 Tablet BY MOUTH ONCE DAILY AT 6:00PM (Patient taking differently: Take 80 mg by mouth every evening. TAKE 1 Tablet BY MOUTH ONCE DAILY AT 6:00PM)  . chlorthalidone (HYGROTON) 25 MG tablet Take 1 tablet (25 mg total) by mouth daily.  . metFORMIN (GLUCOPHAGE) 1000 MG tablet TAKE 1 Tablet  BY MOUTH TWICE DAILY WITH MEALS (Patient taking differently: Take 1,000 mg by mouth 2 (two) times daily with a meal. )  . metoprolol tartrate (LOPRESSOR) 25 MG tablet TAKE 1 Tablet  BY MOUTH TWICE DAILY (Patient taking differently: Take 25 mg by mouth 2 (two) times daily. )  . OZEMPIC, 1 MG/DOSE, 2 MG/1.5ML SOPN Inject 1 mg into the skin every Thursday.  . senna-docusate (SENOKOT-S) 8.6-50 MG tablet Take 2 tablets by mouth at bedtime. Take to prevent constipation, do not take if having diarrhea  . SYNTHROID 25 MCG tablet TAKE 1 Tablet BY MOUTH ONCE DAILY BEFORE BREAKFAST (Patient taking differently: Take 25 mcg by mouth daily before breakfast. )  . traMADol (ULTRAM) 50 MG tablet Take 1  tablet (50 mg total) by mouth every 6 (six) hours as needed for severe pain. Do not take and drive  . TRESIBA FLEXTOUCH 100 UNIT/ML FlexTouch Pen Inject 0.1 mLs (10 Units total) into the skin every morning.  . Vitamin D, Ergocalciferol, (DRISDOL) 1.25 MG (50000 UNIT) CAPS capsule Take 50,000 Units by mouth once a week.  . [DISCONTINUED] traMADol (ULTRAM) 50 MG tablet Take 1 tablet (50 mg total) by mouth every 6 (six) hours as needed for severe pain. Do not take and drive  . [DISCONTINUED] nitrofurantoin, macrocrystal-monohydrate, (MACROBID) 100 MG capsule Take 1 capsule (100 mg total) by mouth 2 (two) times daily.   No facility-administered encounter medications on file as of 02/11/2020.    Allergy: No Known Allergies  Social Hx:   Social History   Socioeconomic History  . Marital  status: Single    Spouse name: Not on file  . Number of children: Not on file  . Years of education: Not on file  . Highest education level: Not on file  Occupational History  . Not on file  Tobacco Use  . Smoking status: Current Every Day Smoker    Packs/day: 0.50    Years: 46.00    Pack years: 23.00    Types: Cigarettes  . Smokeless tobacco: Never Used  Substance and Sexual Activity  . Alcohol use: Not Currently    Alcohol/week: 0.0 standard drinks  . Drug use: No  . Sexual activity: Not Currently  Other Topics Concern  . Not on file  Social History Narrative  . Not on file   Social Determinants of Health   Financial Resource Strain:   . Difficulty of Paying Living Expenses:   Food Insecurity:   . Worried About Charity fundraiser in the Last Year:   . Arboriculturist in the Last Year:   Transportation Needs:   . Film/video editor (Medical):   Marland Kitchen Lack of Transportation (Non-Medical):   Physical Activity:   . Days of Exercise per Week:   . Minutes of Exercise per Session:   Stress:   . Feeling of Stress :   Social Connections:   . Frequency of Communication with Friends and Family:   . Frequency of Social Gatherings with Friends and Family:   . Attends Religious Services:   . Active Member of Clubs or Organizations:   . Attends Archivist Meetings:   Marland Kitchen Marital Status:   Intimate Partner Violence:   . Fear of Current or Ex-Partner:   . Emotionally Abused:   Marland Kitchen Physically Abused:   . Sexually Abused:     Past Surgical Hx:  Past Surgical History:  Procedure Laterality Date  . BUNIONECTOMY Bilateral   . CORONARY STENT INTERVENTION N/A 07/09/2017   Procedure: CORONARY STENT INTERVENTION;  Surgeon: Belva Crome, MD;  Location: [facility];  Service: Cardiovascular;  Laterality: N/A;  . LEFT HEART CATH AND CORONARY ANGIOGRAPHY N/A 07/09/2017   Procedure: LEFT HEART CATH AND CORONARY ANGIOGRAPHY;  Surgeon: Belva Crome, MD;  Location: [facility];  Service: Cardiovascular;  Laterality: N/A;  . ROBOTIC ASSISTED TOTAL HYSTERECTOMY WITH BILATERAL SALPINGO OOPHERECTOMY Bilateral 01/20/2020   Procedure: XI ROBOTIC ASSISTED TOTAL HYSTERECTOMY, UTERUS GREATER THAN 250 GRAMS WITH BILATERAL SALPINGO OOPHORECTOMY, MINI LAPAROTOMY;  Surgeon: Everitt Amber, MD;  Location: [facility];  Service: Gynecology;  Laterality: Bilateral;    Past Medical Hx:  Past Medical History:  Diagnosis Date  . Arthritis   .  Coronary artery disease    a. s/p NSTEMI in 07/2017 with DES to mid-RCA and residual disease along proximal-mid LAD  . Diabetes mellitus without complication ([place])   . High cholesterol   . Hyperkalemia   . Hypertension   . Myocardial infarction ([place])   . Vitamin D deficiency     Past Gynecological History:  See HPI No LMP recorded. Patient is postmenopausal.  Family Hx:  Family History  Problem Relation Age of Onset  . Diabetes Mother   . Heart disease Mother   . Heart disease Father   . Diabetes Sister   . Heart disease Sister   . Diabetes Brother   . Heart disease Brother   . Diabetes Brother   . Heart disease Brother   . Diabetes Brother   . Heart disease Brother   . Diabetes Brother   . Heart disease Brother   . Diabetes Brother   . Heart disease Brother   . Breast cancer Neg Hx   . Ovarian cancer Neg Hx   . Endometrial cancer Neg Hx   . Colon cancer Neg Hx     Review of Systems:  Constitutional  Feels fatigued  ENT Normal appearing ears and nares bilaterally Skin/Breast  No rash, sores, jaundice, itching, dryness Cardiovascular  No chest pain, shortness of breath, or edema  Pulmonary  No cough or wheeze.  Gastro Intestinal  No nausea, vomitting, or diarrhoea. No pain Genito Urinary  No frequency, urgency, dysuria, no bleeding Musculo Skeletal  No myalgia, arthralgia, joint swelling or pain  Neurologic  No weakness, numbness, change in gait,  Psychology  No depression, anxiety, insomnia.    Vitals:  Blood pressure (!) 130/58, pulse 67, temperature 98.2 F (36.8 C), temperature source Oral, resp. rate 17, height _0  (1.6 m), weight 135 lb 8 oz (61.5 kg), SpO2 99 %.  Physical Exam: WD in NAD Neck  Supple NROM, without any enlargements.  Lymph Node Survey No cervical supraclavicular or inguinal adenopathy Cardiovascular  Pulse normal rate, regularity and rhythm. S1 and S2 normal.  Lungs  Clear to auscultation bilateraly, without wheezes/crackles/rhonchi. Good air movement.  Skin  No rash/lesions/breakdown. Echymoses on arms from recent hospitalization.  Psychiatry  Alert and oriented to person, place, and time  Abdomen  Normoactive bowel sounds, abdomen soft, non-tender and nonobese without evidence of hernia. Well healed incisions. Very mild erythema on lateral aspect of minilaparotomy (left). But no purulence/drainage or cellulitis. Back No CVA tenderness Genito Urinary  Vaginal cuff smooth, in tact, no bleeding, no lesions or masses palpable Rectal  deferred Extremities  No bilateral cyanosis, clubbing or edema.   30 minutes of direct face to face counseling time was spent with the patient. This included discussion about prognosis, therapy recommendations and postoperative side effects and are beyond the scope of routine postoperative care.   Thereasa Solo, MD  02/11/2020, 2:07 PM

## 2020-02-11 NOTE — Patient Instructions (Signed)
The cancer was a stage 3A carcinosarcoma of the uterus. This means it had grown all the way through the wall of the uterus and was touching up against the internal organs. Therefore, despite the fact it is removed, it may have left behind cells on the surface of other organs. Therefore Dr Denman George is recommending 6 doses of chemotherapy and 5 treatments of vaginal radiation. She has ordered a CT scan to look at your lungs to ensure there is no cancer spread there. She has scheduled appointments for consultation with the chemo and radiation doctors.  She will see you back after completing treatments.

## 2020-02-12 NOTE — Progress Notes (Addendum)
[facility] [address], [place] [address]   CLINIC:  Medical Oncology/Hematology  CONSULT NOTE  Patient Care Team: Denyce Robert, FNP as PCP - General (Family Medicine) Herminio Commons, MD as PCP - Cardiology (Cardiology)  CHIEF COMPLAINTS/PURPOSE OF CONSULTATION:  Stage IIIa endometrial cancer  HISTORY OF PRESENTING ILLNESS:  [name] 69 y.o. female is here for evaluation of stage IIIa endometrial cancer, at the request of Dr. Everitt Amber from [facility].  She reported having abdominal pain 3 to 4 weeks prior to presentation to the ER when a CT scan showed 11 x 8.6 x 8.9 cm heterogeneous cystic and solid pelvic mass predominantly on the left.  CT was unable to identify whether this was uterine or ovarian in origin.  No ascites, peritoneal carcinomatosis or lymphadenopathy.  She had a history of CAD with MI at age 42.  She had a stent placed and is on aspirin 81 mg.  On 01/20/2019 when she underwent robotic assisted total hysterectomy, bilateral salpingo-oophorectomy.  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  She was evaluated by Dr. Denman George who recommended 6 cycles of adjuvant chemotherapy with vaginal brachytherapy.  Patient lives at home with her sister-in-law.  She worked in Charity fundraiser.  She smoked 1 pack of cigarettes per day for the last 50 years.  She is currently smoking half pack per day.  Denies any tingling or numbness in extremities.  Is able to do her day-to-day activities although does not drive.  She reports that she has recovered very well from surgery.  MEDICAL HISTORY:  Past Medical History:  Diagnosis Date   Arthritis    Coronary artery disease    a. s/p NSTEMI in 07/2017 with DES to mid-RCA and residual disease along proximal-mid LAD   Diabetes mellitus without complication ([place])    High cholesterol    Hyperkalemia    Hypertension    Myocardial infarction ([place])      Vitamin D deficiency     SURGICAL HISTORY: Past Surgical History:  Procedure Laterality Date   ABDOMINAL HYSTERECTOMY     Bilateral oopherectomy     BUNIONECTOMY Bilateral    CORONARY STENT INTERVENTION N/A 07/09/2017   Procedure: CORONARY STENT INTERVENTION;  Surgeon: Belva Crome, MD;  Location: [facility];  Service: Cardiovascular;  Laterality: N/A;   LEFT HEART CATH AND CORONARY ANGIOGRAPHY N/A 07/09/2017   Procedure: LEFT HEART CATH AND CORONARY ANGIOGRAPHY;  Surgeon: Belva Crome, MD;  Location: [facility];  Service: Cardiovascular;  Laterality: N/A;   ROBOTIC ASSISTED TOTAL HYSTERECTOMY WITH BILATERAL SALPINGO OOPHERECTOMY Bilateral 01/20/2020   Procedure: XI ROBOTIC ASSISTED TOTAL HYSTERECTOMY, UTERUS GREATER THAN 250 GRAMS WITH BILATERAL SALPINGO OOPHORECTOMY, MINI LAPAROTOMY;  Surgeon: Everitt Amber, MD;  Location: [facility];  Service: Gynecology;  Laterality: Bilateral;    SOCIAL HISTORY: Social History   Socioeconomic History   Marital status: Divorced    Spouse name: Not on file   Number of children: Not on file   Years of education: Not on file   Highest education level: Not on file  Occupational History   Occupation: retired  Tobacco Use   Smoking status: Current Every Day Smoker    Packs/day: 0.50    Years: 46.00    Pack years: 23.00    Types: Cigarettes   Smokeless tobacco: Never Used  Scientific laboratory technician Use: Never used  Substance and Sexual Activity  Alcohol use: Not Currently    Alcohol/week: 0.0 standard drinks   Drug use: No   Sexual activity: Not Currently  Other Topics Concern   Not on file  Social History Narrative   Not on file   Social Determinants of Health   Financial Resource Strain: Low Risk    Difficulty of Paying Living Expenses: Not hard at all  Food Insecurity: No Food Insecurity   Worried About Charity fundraiser in the Last Year: Never true   [place] in the Last Year: Never true   Transportation Needs: No Transportation Needs   Lack of Transportation (Medical): No   Lack of Transportation (Non-Medical): No  Physical Activity: Insufficiently Active   Days of Exercise per Week: 2 days   Minutes of Exercise per Session: 10 min  Stress: No Stress Concern Present   Feeling of Stress : Only a little  Social Connections: Moderately Integrated   Frequency of Communication with Friends and Family: Twice a week   Frequency of Social Gatherings with Friends and Family: Twice a week   Attends Religious Services: More than 4 times per year   Active Member of [employer] or Organizations: No   Attends Music therapist: 1 to 4 times per year   Marital Status: Divorced  Human resources officer Violence: Not At Risk   Fear of Current or Ex-Partner: No   Emotionally Abused: No   Physically Abused: No   Sexually Abused: No    FAMILY HISTORY: Family History  Problem Relation Age of Onset   Diabetes Mother    Heart disease Mother    Diabetes Sister    Heart disease Sister    Diabetes Brother    Heart disease Brother    Diabetes Brother    Heart disease Brother    Diabetes Brother    Heart disease Brother    Diabetes Brother    Heart disease Brother    Diabetes Brother    Heart disease Brother    Breast cancer Neg Hx    Ovarian cancer Neg Hx    Endometrial cancer Neg Hx    Colon cancer Neg Hx     ALLERGIES:  has No Known Allergies.  MEDICATIONS:  Current Outpatient Medications  Medication Sig Dispense Refill   acetaminophen (TYLENOL) 325 MG tablet Take 2 tablets (650 mg total) by mouth every 6 (six) hours. 20 tablet 0   allopurinol (ZYLOPRIM) 100 MG tablet Take 1 tablet (100 mg total) by mouth daily. 30 tablet 0   aspirin 81 MG chewable tablet Chew 81 mg by mouth daily.      atorvastatin (LIPITOR) 80 MG tablet TAKE 1 Tablet BY MOUTH ONCE DAILY AT 6:00PM (Patient taking differently: Take 80 mg by mouth every evening. TAKE 1  Tablet BY MOUTH ONCE DAILY AT 6:00PM) 90 tablet 1   chlorthalidone (HYGROTON) 25 MG tablet Take 1 tablet (25 mg total) by mouth daily. 30 tablet 0   metFORMIN (GLUCOPHAGE) 1000 MG tablet TAKE 1 Tablet  BY MOUTH TWICE DAILY WITH MEALS (Patient taking differently: Take 1,000 mg by mouth 2 (two) times daily with a meal. ) 60 tablet 0   metoprolol tartrate (LOPRESSOR) 25 MG tablet TAKE 1 Tablet  BY MOUTH TWICE DAILY (Patient taking differently: Take 25 mg by mouth 2 (two) times daily. ) 60 tablet 0   OZEMPIC, 1 MG/DOSE, 2 MG/1.5ML SOPN Inject 1 mg into the skin every Thursday.     senna-docusate (SENOKOT-S) 8.6-50 MG tablet  Take 2 tablets by mouth at bedtime. Take to prevent constipation, do not take if having diarrhea (Patient not taking: Reported on 02/13/2020) 30 tablet 0   SYNTHROID 25 MCG tablet TAKE 1 Tablet BY MOUTH ONCE DAILY BEFORE BREAKFAST (Patient taking differently: Take 25 mcg by mouth daily before breakfast. ) 90 tablet 1   traMADol (ULTRAM) 50 MG tablet Take 1 tablet (50 mg total) by mouth every 6 (six) hours as needed for severe pain. Do not take and drive (Patient not taking: Reported on 02/13/2020) 10 tablet 0   Vitamin D, Ergocalciferol, (DRISDOL) 1.25 MG (50000 UNIT) CAPS capsule Take 50,000 Units by mouth once a week.     No current facility-administered medications for this visit.    REVIEW OF SYSTEMS:   Review of Systems  Cardiovascular: Positive for leg swelling.  All other systems reviewed and are negative.    PHYSICAL EXAMINATION: ECOG PERFORMANCE STATUS: 1 - Symptomatic but completely ambulatory  Vitals:   02/16/20 0818  BP: (!) 159/70  Pulse: 73  Resp: 18  Temp: 98.4 F (36.9 C)  SpO2: 98%   Filed Weights   02/16/20 0818  Weight: 136 lb 9.6 oz (62 kg)   Physical Exam Vitals reviewed.  Constitutional:      Appearance: Normal appearance.  Cardiovascular:     Rate and Rhythm: Normal rate and regular rhythm.     Heart sounds: Normal heart sounds.    Pulmonary:     Effort: Pulmonary effort is normal.     Breath sounds: Normal breath sounds.  Abdominal:     General: There is no distension.     Palpations: Abdomen is soft. There is no mass.  Skin:    General: Skin is warm.  Neurological:     General: No focal deficit present.     Mental Status: She is alert and oriented to person, place, and time.  Psychiatric:        Mood and Affect: Mood normal.        Behavior: Behavior normal.      LABORATORY DATA:  I have reviewed the data as listed CBC Latest Ref Rng & Units 01/21/2020 01/20/2020 01/16/2020  WBC 4.0 - 10.5 K/uL 20.0(H) - 14.2(H)  Hemoglobin 12.0 - 15.0 g/dL 9.2(L) 11.9(L) 10.3(L)  Hematocrit 36 - 46 % 29.0(L) 35.0(L) 32.2(L)  Platelets 150 - 400 K/uL 336 - 391   CMP Latest Ref Rng & Units 02/11/2020 01/21/2020 01/21/2020  Glucose 70 - 99 mg/dL 125(H) 373(H) 301(H)  BUN 8 - 23 mg/dL 12 - 23  Creatinine 0.44 - 1.00 mg/dL 0.86 - 1.02(H)  Sodium 135 - 145 mmol/L 142 - 132(L)  Potassium 3.5 - 5.1 mmol/L 4.6 - 5.4(H)  Chloride 98 - 111 mmol/L 106 - 100  CO2 22 - 32 mmol/L 24 - 23  Calcium 8.9 - 10.3 mg/dL 9.4 - 8.3(L)  Total Protein 6.5 - 8.1 g/dL - - -  Total Bilirubin 0.3 - 1.2 mg/dL - - -  Alkaline Phos 38 - 126 U/L - - -  AST 15 - 41 U/L - - -  ALT 0 - 44 U/L - - -    RADIOGRAPHIC STUDIES: CT ABDOMEN AND PELVIS WITH CONTRAST performed on 01/07/2020 (Accession [id number])  IMPRESSION: 1. Interval development of large heterogeneous cystic and solid pelvic mass, uncertain if origin is uterus or ovaries. Mass is indeterminate for gynecologic malignancy given new finding in postmenopausal patient since the previous exam. 2. 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  Indeterminate 11 mm right adrenal gland nodule, this could be evaluated with nonemergent adrenal CT.  I have personally reviewed the radiological images as listed and agreed with the findings in the report.  ASSESSMENT:  1.  Stage IIIa (PT3PNX)  high-grade carcinosarcoma of the uterus: -Presentation to the ER with lower abdominal pain. -CTAP on 01/07/2020 shows large heterogeneous cystic and solid pelvic mass.  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  Indeterminate 11 mm right adrenal nodule.  Small bilateral renal cysts. -TAH and BSO on 01/20/2020 shows high-grade carcinosarcoma with tumor invading through myometrium and involves the serosa. -MMR was normal.    PLAN:  1.  Stage III high-grade carcinosarcoma of the uterus: -CT of the chest is scheduled on 03/02/2020. -She was evaluated by Dr. Denman George who recommended adjuvant chemotherapy with 6 cycles of carboplatin and paclitaxel along with intercalated vaginal brachytherapy. -We discussed the chemotherapy regimen and side effects in detail. -I have recommended port placement. -We will likely start chemotherapy after port placement.  2.  Diabetes: -Continue Metformin 1000 mg twice daily.  Continue Ozempic 2 mg subcu weekly.  3.  Hypothyroidism: -Continue Synthroid 25 mcg daily.  4.  Hypertension: -Continue chlorthalidone 25 mg daily and Lopressor 25 mg twice daily.   All questions were answered. The patient knows to call the clinic with any problems, questions or concerns.   Derek Jack, MD, 02/16/20 5:11 PM  [place] [phone]   I, Milinda Antis, am acting as a scribe for Dr. Sanda Linger.  I, Derek Jack MD, have reviewed the above documentation for accuracy and completeness, and I agree with the above.

## 2020-02-13 ENCOUNTER — Other Ambulatory Visit: Payer: Self-pay

## 2020-02-13 ENCOUNTER — Encounter (HOSPITAL_COMMUNITY): Payer: Self-pay | Admitting: *Deleted

## 2020-02-16 ENCOUNTER — Encounter (HOSPITAL_COMMUNITY): Payer: Self-pay | Admitting: Hematology

## 2020-02-16 ENCOUNTER — Inpatient Hospital Stay (HOSPITAL_COMMUNITY): Payer: Medicare HMO | Attending: Hematology | Admitting: Hematology

## 2020-02-16 ENCOUNTER — Other Ambulatory Visit: Payer: Self-pay | Admitting: Oncology

## 2020-02-16 ENCOUNTER — Other Ambulatory Visit: Payer: Self-pay

## 2020-02-16 VITALS — BP 159/70 | HR 73 | Temp 98.4°F | Resp 18 | Ht 63.0 in | Wt 136.6 lb

## 2020-02-16 DIAGNOSIS — I10 Essential (primary) hypertension: Secondary | ICD-10-CM | POA: Diagnosis not present

## 2020-02-16 DIAGNOSIS — F17210 Nicotine dependence, cigarettes, uncomplicated: Secondary | ICD-10-CM | POA: Insufficient documentation

## 2020-02-16 DIAGNOSIS — Z7984 Long term (current) use of oral hypoglycemic drugs: Secondary | ICD-10-CM | POA: Diagnosis not present

## 2020-02-16 DIAGNOSIS — C55 Malignant neoplasm of uterus, part unspecified: Secondary | ICD-10-CM | POA: Diagnosis not present

## 2020-02-16 DIAGNOSIS — E119 Type 2 diabetes mellitus without complications: Secondary | ICD-10-CM | POA: Insufficient documentation

## 2020-02-16 DIAGNOSIS — E039 Hypothyroidism, unspecified: Secondary | ICD-10-CM | POA: Insufficient documentation

## 2020-02-16 DIAGNOSIS — Z5111 Encounter for antineoplastic chemotherapy: Secondary | ICD-10-CM | POA: Insufficient documentation

## 2020-02-16 DIAGNOSIS — Z79899 Other long term (current) drug therapy: Secondary | ICD-10-CM

## 2020-02-16 DIAGNOSIS — C541 Malignant neoplasm of endometrium: Secondary | ICD-10-CM

## 2020-02-16 NOTE — Patient Instructions (Addendum)
[place] at [facility] Discharge Instructions  You were seen today by Dr. Delton Coombes. He went over your recent results. The treatment options were discussed today, as well as the side effects. You will receive chemotherapy training. You will be scheduled for a port placement. Dr. Delton Coombes will see you back after your port placement for labs and follow up.   Thank you for choosing [facility] at [facility] to provide your oncology and hematology care.  To afford each patient quality time with our provider, please arrive at least 15 minutes before your scheduled appointment time.   If you have a lab appointment with the [facility] please come in thru the Main Entrance and check in at the main information desk  You need to re-schedule your appointment should you arrive 10 or more minutes late.  We strive to give you quality time with our providers, and arriving late affects you and other patients whose appointments are after yours.  Also, if you no show three or more times for appointments you may be dismissed from the clinic at the providers discretion.     Again, thank you for choosing [facility].  Our hope is that these requests will decrease the amount of time that you wait before being seen by our physicians.       _____________________________________________________________  Should you have questions after your visit to [facility], please contact our office at [phone] between the hours of 8:00 a.m. and 4:30 p.m.  Voicemails left after 4:00 p.m. will not be returned until the following business day.  For prescription refill requests, have your pharmacy contact our office and allow 72 hours.    Cancer Center Support Programs:   > Cancer Support Group  2nd Tuesday of the month 1pm-2pm, Journey Room

## 2020-02-16 NOTE — Progress Notes (Signed)
Gynecologic Oncology Multi-Disciplinary Disposition Conference Note  Date of the Conference: 02/16/2020  Patient Name: Lisa Jenkins  Referring Provider: Primary GYN Oncologist:  Stage/Disposition:  Stage 3A carcinosarcoma of the uterus. Disposition is to chemotherapy and vaginal brachytherapy.   This Multidisciplinary conference took place involving physicians from [place], [place], Radiation Oncology, Pathology, Radiology along with the Gynecologic Oncology Nurse Practitioner and RN.  Comprehensive assessment of the patient's malignancy, staging, need for surgery, chemotherapy, radiation therapy, and need for further testing were reviewed. Supportive measures, both inpatient and following discharge were also discussed. The recommended plan of care is documented. Greater than 35 minutes were spent correlating and coordinating this patient's care.

## 2020-02-17 NOTE — Patient Instructions (Addendum)
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  You will be treated in the clinic every 3 weeks with a combination of chemotherapy drugs.  Those drugs are paclitaxel (Taxol) and carboplatin.  The intent of treatment is cure.  You will see the doctor regularly throughout treatment.  We will obtain blood work from you prior to every treatment and monitor your results to make sure it is safe to give your treatment. The doctor monitors your response to treatment by the way you are feeling, your blood work, and by obtaining scans periodically.  There will be wait times while you are here for treatment.  It will take about 30 minutes to 1 hour for your lab work to result.  Then there will be wait times while pharmacy mixes your medications.    Medications you will receive in the clinic prior to your chemotherapy medications:  Aloxi:  ALOXI is used in adults to help prevent the nausea and vomiting that happens with certain chemotherapy drugs. This is a long acting medication and will remain in your system for about 2 days  Dexamethasone:  This is a steroid given prior to chemotherapy to help prevent allergic reactions; it may also help prevent and control nausea and diarrhea.   Pepcid:  This medication is a histamine blocker that helps prevent and allergic reaction to your chemotherapy.   Benadryl:  This is a histamine blocker (different from the Pepcid) that helps prevent allergic/infusion reactions to your chemotherapy. This medication may cause dizziness/drowsiness.    Paclitaxel (Taxol)  About This Drug  Paclitaxel is a drug used to treat cancer. It is given in the vein (IV) through your port a cath.  This will take 3 hours to infuse.  This first infusion will take longer because it is increased slowly to monitor for reactions.  The nurse will be in the room with you for the first 15 minutes of the first infusion.  Possible Side Effects  .  Hair loss. Hair loss is often temporary, although with certain medicine, hair loss can sometimes be permanent. Hair loss may happen suddenly or gradually. If you lose hair, you may lose it from your head, face, armpits, pubic area, chest, and/or legs. You may also notice your hair getting thin.  . Swelling of your legs, ankles and/or feet (edema)  . Flushing  . Nausea and throwing up (vomiting)  . Loose bowel movements (diarrhea)  . Bone marrow depression. This is a decrease in the number of white blood cells, red blood cells, and platelets. This may raise your risk of infection, make you tired and weak (fatigue), and raise your risk of bleeding.  . Effects on the nerves are called peripheral neuropathy. You may feel numbness, tingling, or pain in your hands and feet. It may be hard for you to button your clothes, open jars, or walk as usual. The effect on the nerves may get worse with more doses of the drug. These effects get better in some people after the drug is stopped but it does not get better in all people.  . Changes in your liver function  . Bone, joint and muscle pain  . Abnormal EKG  . Allergic reaction: Allergic reactions, including anaphylaxis are rare but may happen in some patients. Signs of allergic reaction to this drug may be swelling of the face, feeling like your tongue or throat are swelling, trouble breathing, rash, itching, fever, chills, feeling dizzy, and/or  feeling that your heart is beating in a fast or not normal way. If this happens, do not take another dose of this drug. You should get urgent medical treatment.  . Infection  . Changes in your kidney function.  Note: Each of the side effects above was reported in 20% or greater of patients treated with paclitaxel. Not all possible side effects are included above.   Warnings and Precautions  . Severe allergic reactions  . Severe bone marrow depression   Treating Side Effects  . To help with hair  loss, wash with a mild shampoo and avoid washing your hair every day.  . Avoid rubbing your scalp, instead, pat your hair or scalp dry  . Avoid coloring your hair  . Limit your use of hair spray, electric curlers, blow dryers, and curling irons.  . If you are interested in getting a wig, talk to your nurse. You can also call the [employer] at 800-ACS-2345 to find out information about the "Look Good, Feel Better" program close to where you live. It is a free program where women getting chemotherapy can learn about wigs, turbans and scarves as well as makeup techniques and skin and nail care.  . Ask your doctor or nurse about medicines that are available to help stop or lessen diarrhea and/or nausea.  . To help with nausea and vomiting, eat small, frequent meals instead of three large meals a day. Choose foods and drinks that are at room temperature. Ask your nurse or doctor about other helpful tips and medicine that is available to help or stop lessen these symptoms.  . If you get diarrhea, eat low-fiber foods that are high in protein and calories and avoid foods that can irritate your digestive tracts or lead to cramping. Ask your nurse or doctor about medicine that can lessen or stop your diarrhea.  . Mouth care is very important. Your mouth care should consist of routine, gentle cleaning of your teeth or dentures and rinsing your mouth with a mixture of 1/2 teaspoon of salt in 8 ounces of water or  teaspoon of baking soda in 8 ounces of water. This should be done at least after each meal and at bedtime.  . If you have mouth sores, avoid mouthwash that has alcohol. Also avoid alcohol and smoking because they can bother your mouth and throat.  . Drink plenty of fluids (a minimum of eight glasses per day is recommended).  . Take your temperature as your doctor or nurse tells you, and whenever you feel like you may have a fever.  . Talk to your doctor or nurse about precautions  you can take to avoid infections and bleeding.  . Be careful when cooking, walking, and handling sharp objects and hot liquids.  Food and Drug Interactions  . There are no known interactions of paclitaxel with food.  . This drug may interact with other medicines. Tell your doctor and pharmacist about all the medicines and dietary supplements (vitamins, minerals, herbs and others) that you are taking at this time.  . The safety and use of dietary supplements and alternative diets are often not known. Using these might affect your cancer or interfere with your treatment. Until more is known, you should not use dietary supplements or alternative diets without your cancer doctor's help.  When to Call the Doctor  Call your doctor or nurse if you have any of the following symptoms and/or any new or unusual symptoms:  . Fever of  100.4 F (38 C) or above  . Chills  . Redness, pain, warmth, or swelling at the IV site during the infusion  . Signs of allergic reaction: swelling of the face, feeling like your tongue or throat are swelling, trouble breathing, rash, itching, fever, chills, feeling dizzy, and/or feeling that your heart is beating in a fast or not normal way  . Feeling that your heart is beating in a fast or not normal way (palpitations)  . Weight gain of 5 pounds in one week (fluid retention)  . Decreased urine or very dark urine  . Signs of liver problems: dark urine, pale bowel movements, bad stomach pain, feeling very tired and weak, unusual itching, or yellowing of the eyes or skin  . Heavy menstrual period that lasts longer than normal  . Easy bruising or bleeding  . Nausea that stops you from eating or drinking, and/or that is not relieved by prescribed medicines.  . Loose bowel movements (diarrhea) more than 4 times a day or diarrhea with weakness or lightheadedness  . Pain in your mouth or throat that makes it hard to eat or drink  . Lasting loss of appetite or  rapid weight loss of five pounds in a week  . Signs of peripheral neuropathy: numbness, tingling, or decreased feeling in fingers or toes; trouble walking or changes in the way you walk; or feeling clumsy when buttoning clothes, opening jars, or other routine activities  . Joint and muscle pain that is not relieved by prescribed medicines  . Extreme fatigue that interferes with normal activities  . While you are getting this drug, please tell your nurse right away if you have any pain, redness, or swelling at the site of the IV infusion.  . If you think you are pregnant.  Reproduction Warnings  . Pregnancy warning: This drug may have harmful effects on the unborn child, it is recommended that effective methods of birth control should be used during your cancer treatment. Let your doctor know right away if you think you may be pregnant.  . Breast feeding warning: Women should not breast feed during treatment because this drug could enter the breastmilk and cause harm to a breast feeding baby.   Carboplatin (Paraplatin, CBDCA)  About This Drug  Carboplatin is used to treat cancer. It is given in the vein through your port a cath.  It will take 30 minutes to infuse. You will receive this medication every 3 weeks.   Possible Side Effects  . Bone marrow suppression. This is a decrease in the number of white blood cells, red blood cells, and platelets. This may raise your risk of infection, make you tired and weak (fatigue), and raise your risk of bleeding.  . Nausea and vomiting (throwing up)  . Weakness  . Changes in your liver function  . Changes in your kidney function  . Electrolyte changes  . Pain  Note: Each of the side effects above was reported in 20% or greater of patients treated with carboplatin. Not all possible side effects are included above.   Warnings and Precautions  . Severe bone marrow suppression  . Allergic reactions, including anaphylaxis are rare  but may happen in some patients. Signs of allergic reaction to this drug may be swelling of the face, feeling like your tongue or throat are swelling, trouble breathing, rash, itching, fever, chills, feeling dizzy, and/or feeling that your heart is beating in a fast or not normal way. If this happens, do not  take another dose of this drug. You should get urgent medical treatment.  . Severe nausea and vomiting  . Effects on the nerves are called peripheral neuropathy. This risk is increased if you are over the age of 35 or if you have received other medicine with risk of peripheral neuropathy. You may feel numbness, tingling, or pain in your hands and feet. It may be hard for you to button your clothes, open jars, or walk as usual. The effect on the nerves may get worse with more doses of the drug. These effects get better in some people after the drug is stopped but it does not get better in all people.  Marland Kitchen Blurred vision, loss of vision or other changes in eyesight  . Decreased hearing  . Skin and tissue irritation including redness, pain, warmth, or swelling at the IV site if the drug leaks out of the vein and into nearby tissue.  . Severe changes in your kidney function, which can cause kidney failure  . Severe changes in your liver function, which can cause liver failure  Note: Some of the side effects above are very rare. If you have concerns and/or questions, please discuss them with your medical team.  Important Information  . This drug may be present in the saliva, tears, sweat, urine, stool, vomit, semen, and vaginal secretions. Talk to your doctor and/or your nurse about the necessary precautions to take during this time.  Treating Side Effects  . Manage tiredness by pacing your activities for the day.  . Be sure to include periods of rest between energy-draining activities.  . To decrease the risk of infection, wash your hands regularly.  . Avoid close contact with people who  have a cold, the flu, or other infections.  . Take your temperature as your doctor or nurse tells you, and whenever you feel like you may have a fever.  . To help decrease the risk of bleeding, use a soft toothbrush. Check with your nurse before using dental floss.  . Be very careful when using knives or tools.  . Use an electric shaver instead of a razor.  . Drink plenty of fluids (a minimum of eight glasses per day is recommended).  . If you throw up or have loose bowel movements, you should drink more fluids so that you do not become dehydrated (lack of water in the body from losing too much fluid).  . To help with nausea and vomiting, eat small, frequent meals instead of three large meals a day.  Choose foods and drinks that are at room temperature. Ask your nurse or doctor about other helpful tips and medicine that is available to help stop or lessen these symptoms.  . If you have numbness and tingling in your hands and feet, be careful when cooking, walking, and handling sharp objects and hot liquids.  Marland Kitchen Keeping your pain under control is important to your well-being. Please tell your doctor or nurse if you are experiencing pain.  Food and Drug Interactions  . There are no known interactions of carboplatin with food.  . This drug may interact with other medicines. Tell your doctor and pharmacist about all the prescription and over-the-counter medicines and dietary supplements (vitamins, minerals, herbs and others) that you are taking at this time. Also, check with your doctor or pharmacist before starting any new prescription or over-the-counter medicines, or dietary supplements to make sure that there are no interactions.  When to Call the Doctor  Call your  doctor or nurse if you have any of these symptoms and/or any new or unusual symptoms:  . Fever of 100.4 F (38 C) or higher  . Chills  . Tiredness that interferes with your daily activities  . Feeling dizzy or  lightheaded  . Easy bleeding or bruising  . Nausea that stops you from eating or drinking and/or is not relieved by prescribed medicines  . Throwing up/vomiting  . Blurred vision or other changes in eyesight  . Decrease in hearing or ringing in the ear  . Signs of allergic reaction: swelling of the face, feeling like your tongue or throat are swelling, trouble breathing, rash, itching, fever, chills, feeling dizzy, and/or feeling that your heart is beating in a fast or not normal way. If this happens, call 911 for emergency care.  . While you are getting this drug, please tell your nurse right away if you have any pain, redness, or swelling at the site of the IV infusion  . Signs of possible liver problems: dark urine, pale bowel movements, bad stomach pain, feeling very tired and weak, unusual itching, or yellowing of the eyes or skin  . Decreased urine, or very dark urine  . Numbness, tingling, or pain in your hands and feet  . Pain that does not go away or is not relieved by prescribed medicine  . If you think you may be pregnant  Reproduction Warnings  . Pregnancy warning: This drug may have harmful effects on the unborn baby. Women of child bearing potential should use effective methods of birth control during your cancer treatment. Let your doctor know right away if you think you may be pregnant.  . Breastfeeding warning: It is not known if this drug passes into breast milk. For this reason, women should not breastfeed during treatment because this drug could enter the breast milk and cause harm to a breastfeeding baby.  . Fertility warning: Human fertility studies have not been done with this drug. Talk with your doctor or nurse if you plan to have children. Ask for information on sperm or egg banking.  SELF CARE ACTIVITIES WHILE RECEIVING CHEMOTHERAPY:  Hydration Increase your fluid intake 48 hours prior to treatment and drink at least 8 to 12 cups (64 ounces) of  water/decaffeinated beverages per day after treatment. You can still have your cup of coffee or soda but these beverages do not count as part of your 8 to 12 cups that you need to drink daily. No alcohol intake.  Medications Continue taking your normal prescription medication as prescribed.  If you start any new herbal or new supplements please let us know first to make sure it is safe.  Mouth Care Have teeth cleaned professionally before starting treatment. Keep dentures and partial plates clean. Use soft toothbrush and do not use mouthwashes that contain alcohol. Biotene is a good mouthwash that is available at most pharmacies or may be ordered by calling [phone]. Use warm salt water gargles (1 teaspoon salt per 1 quart warm water) before and after meals and at bedtime. If you need dental work, please let the doctor know before you go for your appointment so that we can coordinate the best possible time for you in regards to your chemo regimen. You need to also let your dentist know that you are actively taking chemo. We may need to do labs prior to your dental appointment.  Skin Care Always use sunscreen that has not expired and with SPF Nancy Fetter Protection Factor) of  50 or higher. Wear hats to protect your head from the sun. Remember to use sunscreen on your hands, ears, face, & feet.  Use good moisturizing lotions such as udder cream, eucerin, or even Vaseline. Some chemotherapies can cause dry skin, color changes in your skin and nails.    . Avoid long, hot showers or baths. . Use gentle, fragrance-free soaps and laundry detergent. . Use moisturizers, preferably creams or ointments rather than lotions because the thicker consistency is better at preventing skin dehydration. Apply the cream or ointment within 15 minutes of showering. Reapply moisturizer at night, and moisturize your hands every time after you wash them.  Hair Loss (if your doctor says your hair will fall out)  . If your  doctor says that your hair is likely to fall out, decide before you begin chemo whether you want to wear a wig. You may want to shop before treatment to match your hair color. . Hats, turbans, and scarves can also camouflage hair loss, although some people prefer to leave their heads uncovered. If you go bare-headed outdoors, be sure to use sunscreen on your scalp. . Cut your hair short. It eases the inconvenience of shedding lots of hair, but it also can reduce the emotional impact of watching your hair fall out. . Don't perm or color your hair during chemotherapy. Those chemical treatments are already damaging to hair and can enhance hair loss. Once your chemo treatments are done and your hair has grown back, it's OK to resume dyeing or perming hair.  With chemotherapy, hair loss is almost always temporary. But when it grows back, it may be a different color or texture. In older adults who still had hair color before chemotherapy, the new growth may be completely gray.  Often, new hair is very fine and soft.  Infection Prevention Please wash your hands for at least 30 seconds using warm soapy water. Handwashing is the #1 way to prevent the spread of germs. Stay away from sick people or people who are getting over a cold. If you develop respiratory systems such as green/yellow mucus production or productive cough or persistent cough let us know and we will see if you need an antibiotic. It is a good idea to keep a pair of gloves on when going into grocery stores/[employer] to decrease your risk of coming into contact with germs on the carts, etc. Carry alcohol hand gel with you at all times and use it frequently if out in public. If your temperature reaches 100.4 or higher please call the clinic and let us know.  If it is after hours or on the weekend please go to the ER if your temperature is over 100.4.  Please have your own personal thermometer at home to use.    Sex and bodily fluids If you are going to  have sex, a condom must be used to protect the person that isn't taking chemotherapy. Chemo can decrease your libido (sex drive). For a few days after chemotherapy, chemotherapy can be excreted through your bodily fluids.  When using the toilet please close the lid and flush the toilet twice.  Do this for a few day after you have had chemotherapy.   Effects of chemotherapy on your sex life Some changes are simple and won't last long. They won't affect your sex life permanently.  Sometimes you may feel: . too tired . not strong enough to be very active . sick or sore  . not in the mood .  anxious or low  Your anxiety might not seem related to sex. For example, you may be worried about the cancer and how your treatment is going. Or you may be worried about money, or about how you family are coping with your illness.  These things can cause stress, which can affect your interest in sex. It's important to talk to your partner about how you feel.  Remember - the changes to your sex life don't usually last long. There's usually no medical reason to stop having sex during chemo. The drugs won't have any long term physical effects on your performance or enjoyment of sex. Cancer can't be passed on to your partner during sex  Contraception It's important to use reliable contraception during treatment. Avoid getting pregnant while you or your partner are having chemotherapy. This is because the drugs may harm the baby. Sometimes chemotherapy drugs can leave a man or woman infertile.  This means you would not be able to have children in the future. You might want to talk to someone about permanent infertility. It can be very difficult to learn that you may no longer be able to have children. Some people find counselling helpful. There might be ways to preserve your fertility, although this is easier for men than for women. You may want to speak to a fertility expert. You can talk about sperm banking or harvesting  your eggs. You can also ask about other fertility options, such as donor eggs. If you have or have had breast cancer, your doctor might advise you not to take the contraceptive pill. This is because the hormones in it might affect the cancer. It is not known for sure whether or not chemotherapy drugs can be passed on through semen or secretions from the vagina. Because of this some doctors advise people to use a barrier method if you have sex during treatment. This applies to vaginal, anal or oral sex. Generally, doctors advise a barrier method only for the time you are actually having the treatment and for about a week after your treatment. Advice like this can be worrying, but this does not mean that you have to avoid being intimate with your partner. You can still have close contact with your partner and continue to enjoy sex.  Animals If you have cats or birds we just ask that you not change the litter or change the cage.  Please have someone else do this for you while you are on chemotherapy.   Food Safety During and After Cancer Treatment Food safety is important for people both during and after cancer treatment. Cancer and cancer treatments, such as chemotherapy, radiation therapy, and stem cell/bone marrow transplantation, often weaken the immune system. This makes it harder for your body to protect itself from foodborne illness, also called food poisoning. Foodborne illness is caused by eating food that contains harmful bacteria, parasites, or viruses.  Foods to avoid Some foods have a higher risk of becoming tainted with bacteria. These include: Marland Kitchen Unwashed fresh fruit and vegetables, especially leafy vegetables that can hide dirt and other contaminants . Raw sprouts, such as alfalfa sprouts . Raw or undercooked beef, especially ground beef, or other raw or undercooked meat and poultry . Fatty, fried, or spicy foods immediately before or after treatment.  These can sit heavy on your stomach and  make you feel nauseous. . Raw or undercooked shellfish, such as oysters. . Sushi and sashimi, which often contain raw fish.  . Unpasteurized beverages, such as  unpasteurized fruit juices, raw milk, raw yogurt, or cider . Undercooked eggs, such as soft boiled, over easy, and poached; raw, unpasteurized eggs; or foods made with raw egg, such as homemade raw cookie dough and homemade mayonnaise  Simple steps for food safety  Shop smart. . Do not buy food stored or displayed in an unclean area. . Do not buy bruised or damaged fruits or vegetables. . Do not buy cans that have cracks, dents, or bulges. . Pick up foods that can spoil at the end of your shopping trip and store them in a cooler on the way home.  Prepare and clean up foods carefully. . Rinse all fresh fruits and vegetables under running water, and dry them with a clean towel or paper towel. . Clean the top of cans before opening them. . After preparing food, wash your hands for 20 seconds with hot water and soap. Pay special attention to areas between fingers and under nails. . Clean your utensils and dishes with hot water and soap. Marland Kitchen Disinfect your kitchen and cutting boards using 1 teaspoon of liquid, unscented bleach mixed into 1 quart of water.    Dispose of old food. . Eat canned and packaged food before its expiration date (the "use by" or "best before" date). . Consume refrigerated leftovers within 3 to 4 days. After that time, throw out the food. Even if the food does not smell or look spoiled, it still may be unsafe. Some bacteria, such as Listeria, can grow even on foods stored in the refrigerator if they are kept for too long.  Take precautions when eating out. . At restaurants, avoid buffets and salad bars where food sits out for a long time and comes in contact with many people. Food can become contaminated when someone with a virus, often a norovirus, or another "bug" handles it. . Put any leftover food in a "to-go"  container yourself, rather than having the server do it. And, refrigerate leftovers as soon as you get home. . Choose restaurants that are clean and that are willing to prepare your food as you order it cooked.   AT HOME MEDICATIONS:                                                                                                                                                                Compazine/Prochlorperazine 10mg  tablet. Take 1 tablet every 6 hours as needed for nausea/vomiting. (This can make you sleepy)   EMLA cream. Apply a quarter size amount to port site 1 hour prior to chemo. Do not rub in. Cover with plastic wrap.    Diarrhea Sheet   If you are having loose stools/diarrhea, please purchase Imodium and begin taking as outlined:  At the first sign of poorly formed  or loose stools you should begin taking Imodium (loperamide) 2 mg capsules.  Take two tablets (4mg ) followed by one tablet (2mg ) every 2 hours - DO NOT EXCEED 8 tablets in 24 hours.  If it is bedtime and you are having loose stools, take 2 tablets at bedtime, then 2 tablets every 4 hours until morning.   Always call the [facility] if you are having loose stools/diarrhea that you can't get under control.  Loose stools/diarrhea leads to dehydration (loss of water) in your body.  We have other options of trying to get the loose stools/diarrhea to stop but you must let us know!   Constipation Sheet  Colace - 100 mg capsules - take 2 capsules daily.  If this doesn't help then you can increase to 2 capsules twice daily.  Please call if the above does not work for you. Do not go more than 2 days without a bowel movement.  It is very important that you do not become constipated.  It will make you feel sick to your stomach (nausea) and can cause abdominal pain and vomiting.  Nausea Sheet   Compazine/Prochlorperazine 10mg  tablet. Take 1 tablet every 6 hours as needed for nausea/vomiting (This can make you drowsy).  If you  are having persistent nausea (nausea that does not stop) please call the [facility] and let us know the amount of nausea that you are experiencing.  If you begin to vomit, you need to call the [facility] and if it is the weekend and you have vomited more than one time and can't get it to stop-go to the Emergency Room.  Persistent nausea/vomiting can lead to dehydration (loss of fluid in your body) and will make you feel very weak and unwell. Ice chips, sips of clear liquids, foods that are at room temperature, crackers, and toast tend to be better tolerated.   SYMPTOMS TO REPORT AS SOON AS POSSIBLE AFTER TREATMENT:  FEVER GREATER THAN 100.4 F  CHILLS WITH OR WITHOUT FEVER  NAUSEA AND VOMITING THAT IS NOT CONTROLLED WITH YOUR NAUSEA MEDICATION  UNUSUAL SHORTNESS OF BREATH  UNUSUAL BRUISING OR BLEEDING  TENDERNESS IN MOUTH AND THROAT WITH OR WITHOUT PRESENCE OF ULCERS  URINARY PROBLEMS  BOWEL PROBLEMS  UNUSUAL RASH      Wear comfortable clothing and clothing appropriate for easy access to any Portacath or PICC line. Let us know if there is anything that we can do to make your therapy better!    What to do if you need assistance after hours or on the weekends: CALL [phone].  HOLD on the line, do not hang up.  You will hear multiple messages but at the end you will be connected with a nurse triage line.  They will contact the doctor if necessary.  Most of the time they will be able to assist you.  Do not call the hospital operator.      I have been informed and understand all of the instructions given to me and have received a copy. I have been instructed to call the clinic [phone] or my family physician as soon as possible for continued medical care, if indicated. I do not have any more questions at this time but understand that I may call the [facility] or the Patient Navigator at [phone] during office hours should I have questions or need assistance in  obtaining follow-up care.

## 2020-02-18 MED ORDER — LIDOCAINE-PRILOCAINE 2.5-2.5 % EX CREA
TOPICAL_CREAM | CUTANEOUS | 0 refills | Status: DC
Start: 2020-02-18 — End: 2020-05-03

## 2020-02-18 MED ORDER — PROCHLORPERAZINE MALEATE 10 MG PO TABS: 10.0000 mg | ORAL_TABLET | Freq: Four times a day (QID) | ORAL | 0 refills | Status: DC | PRN

## 2020-02-19 ENCOUNTER — Inpatient Hospital Stay (HOSPITAL_COMMUNITY): Payer: Medicare HMO

## 2020-02-19 ENCOUNTER — Other Ambulatory Visit: Payer: Self-pay

## 2020-02-19 NOTE — Progress Notes (Signed)

## 2020-02-19 NOTE — Progress Notes (Signed)
START ON PATHWAY REGIMEN - Uterine     A cycle is every 21 days:     Paclitaxel      Carboplatin   **Always confirm dose/schedule in your pharmacy ordering system**  Patient Characteristics: Carcinosarcoma, Newly Diagnosed, Postoperative (Pathologic Staging), Postoperative Histology: Carcinosarcoma Therapeutic Status: Newly Diagnosed, Postoperative (Pathologic Staging) AJCC M Category: cM0 AJCC 8 Stage Grouping: Unknown AJCC T Category: pT3 AJCC N Category: pNX Intent of Therapy: Curative Intent, Discussed with Patient

## 2020-02-24 ENCOUNTER — Other Ambulatory Visit: Payer: Self-pay

## 2020-02-24 ENCOUNTER — Encounter: Payer: Self-pay | Admitting: General Surgery

## 2020-02-24 ENCOUNTER — Ambulatory Visit (INDEPENDENT_AMBULATORY_CARE_PROVIDER_SITE_OTHER): Payer: Medicare HMO | Admitting: General Surgery

## 2020-02-24 VITALS — BP 147/79 | HR 64 | Temp 97.9°F | Resp 16 | Ht 63.0 in | Wt 136.0 lb

## 2020-02-24 DIAGNOSIS — C541 Malignant neoplasm of endometrium: Secondary | ICD-10-CM | POA: Diagnosis not present

## 2020-02-24 NOTE — Progress Notes (Signed)
Lisa Jenkins; [id number]; 1950/11/18   HPI Patient is a 69 year old white female who was referred to my care by Dr. Delton Coombes of oncology for Port-A-Cath placement.  She was recently diagnosed with endometrial carcinoma and needs central venous access for chemotherapy. Past Medical History:  Diagnosis Date  . Arthritis   . Coronary artery disease    a. s/p NSTEMI in 07/2017 with DES to mid-RCA and residual disease along proximal-mid LAD  . Diabetes mellitus without complication ([place])   . High cholesterol   . Hyperkalemia   . Hypertension   . Myocardial infarction ([place])   . Vitamin D deficiency     Past Surgical History:  Procedure Laterality Date  . ABDOMINAL HYSTERECTOMY    . Bilateral oopherectomy    . BUNIONECTOMY Bilateral   . CORONARY STENT INTERVENTION N/A 07/09/2017   Procedure: CORONARY STENT INTERVENTION;  Surgeon: Belva Crome, MD;  Location: [facility];  Service: Cardiovascular;  Laterality: N/A;  . LEFT HEART CATH AND CORONARY ANGIOGRAPHY N/A 07/09/2017   Procedure: LEFT HEART CATH AND CORONARY ANGIOGRAPHY;  Surgeon: Belva Crome, MD;  Location: [facility];  Service: Cardiovascular;  Laterality: N/A;  . ROBOTIC ASSISTED TOTAL HYSTERECTOMY WITH BILATERAL SALPINGO OOPHERECTOMY Bilateral 01/20/2020   Procedure: XI ROBOTIC ASSISTED TOTAL HYSTERECTOMY, UTERUS GREATER THAN 250 GRAMS WITH BILATERAL SALPINGO OOPHORECTOMY, MINI LAPAROTOMY;  Surgeon: Everitt Amber, MD;  Location: [facility];  Service: Gynecology;  Laterality: Bilateral;    Family History  Problem Relation Age of Onset  . Diabetes Mother   . Heart disease Mother   . Diabetes Sister   . Heart disease Sister   . Diabetes Brother   . Heart disease Brother   . Diabetes Brother   . Heart disease Brother   . Diabetes Brother   . Heart disease Brother   . Diabetes Brother   . Heart disease Brother   . Diabetes Brother   . Heart disease Brother   . Breast cancer Neg Hx   . Ovarian cancer Neg Hx   .  Endometrial cancer Neg Hx   . Colon cancer Neg Hx     Current Outpatient Medications on File Prior to Visit  Medication Sig Dispense Refill  . acetaminophen (TYLENOL) 325 MG tablet Take 2 tablets (650 mg total) by mouth every 6 (six) hours. 20 tablet 0  . aspirin 81 MG chewable tablet Chew 81 mg by mouth daily.     Marland Kitchen atorvastatin (LIPITOR) 80 MG tablet TAKE 1 Tablet BY MOUTH ONCE DAILY AT 6:00PM (Patient taking differently: Take 80 mg by mouth every evening. TAKE 1 Tablet BY MOUTH ONCE DAILY AT 6:00PM) 90 tablet 1  . [START ON 03/02/2020] CARBOPLATIN IV Inject into the vein every 21 ( twenty-one) days.    . chlorthalidone (HYGROTON) 25 MG tablet Take 1 tablet (25 mg total) by mouth daily. 30 tablet 0  . lidocaine-prilocaine (EMLA) cream Apply small amount to port a cath site (do not rub in) and cover with plastic wrap 1 hour prior to chemotherapy appointments 30 g 0  . metFORMIN (GLUCOPHAGE) 1000 MG tablet TAKE 1 Tablet  BY MOUTH TWICE DAILY WITH MEALS (Patient taking differently: Take 1,000 mg by mouth 2 (two) times daily with a meal. ) 60 tablet 0  . metoprolol tartrate (LOPRESSOR) 25 MG tablet TAKE 1 Tablet  BY MOUTH TWICE DAILY (Patient taking differently: Take 25 mg by mouth 2 (two) times daily. ) 60 tablet 0  . OZEMPIC, 1 MG/DOSE,  2 MG/1.5ML SOPN Inject 1 mg into the skin every Thursday.    Derrill Memo ON 03/02/2020] PACLITAXEL IV Inject into the vein every 21 ( twenty-one) days.    . prochlorperazine (COMPAZINE) 10 MG tablet Take 1 tablet (10 mg total) by mouth every 6 (six) hours as needed for nausea or vomiting. 30 tablet 0  . senna-docusate (SENOKOT-S) 8.6-50 MG tablet Take 2 tablets by mouth at bedtime. Take to prevent constipation, do not take if having diarrhea 30 tablet 0  . SYNTHROID 25 MCG tablet TAKE 1 Tablet BY MOUTH ONCE DAILY BEFORE BREAKFAST (Patient taking differently: Take 25 mcg by mouth daily before breakfast. ) 90 tablet 1  . traMADol (ULTRAM) 50 MG tablet Take 1 tablet (50 mg  total) by mouth every 6 (six) hours as needed for severe pain. Do not take and drive 10 tablet 0  . Vitamin D, Ergocalciferol, (DRISDOL) 1.25 MG (50000 UNIT) CAPS capsule Take 50,000 Units by mouth once a week.    Marland Kitchen allopurinol (ZYLOPRIM) 100 MG tablet Take 1 tablet (100 mg total) by mouth daily. 30 tablet 0   No current facility-administered medications on file prior to visit.    No Known Allergies  Social History   Substance and Sexual Activity  Alcohol Use Not Currently  . Alcohol/week: 0.0 standard drinks    Social History   Tobacco Use  Smoking Status Current Every Day Smoker  . Packs/day: 0.50  . Years: 46.00  . Pack years: 23.00  . Types: Cigarettes  Smokeless Tobacco Never Used    Review of Systems  Constitutional: Positive for malaise/fatigue.  HENT: Negative.   Eyes: Negative.   Respiratory: Negative.   Cardiovascular: Negative.   Gastrointestinal: Negative.   Genitourinary: Negative.   Musculoskeletal: Negative.   Skin: Negative.   Neurological: Negative.   Endo/Heme/Allergies: Negative.   Psychiatric/Behavioral: Negative.     Objective   Vitals:   02/24/20 1012  BP: (!) 147/79  Pulse: 64  Resp: 16  Temp: 97.9 F (36.6 C)  SpO2: 96%    Physical Exam Vitals reviewed.  Constitutional:      Appearance: Normal appearance. She is normal weight. She is not ill-appearing.  HENT:     Head: Normocephalic and atraumatic.  Cardiovascular:     Rate and Rhythm: Normal rate and regular rhythm.     Heart sounds: Normal heart sounds. No murmur heard.  No friction rub. No gallop.   Pulmonary:     Effort: Pulmonary effort is normal. No respiratory distress.     Breath sounds: Normal breath sounds. No stridor. No wheezing, rhonchi or rales.  Skin:    General: Skin is warm and dry.  Neurological:     Mental Status: She is alert and oriented to person, place, and time.    Dr. Tomie China notes reviewed Assessment  Endometrial carcinoma, need for  central venous access Plan   Patient is scheduled for Port-A-Cath insertion on 03/01/2020.  The risks and benefits of the procedure including bleeding, infection, and pneumothorax were fully explained to the patient, who gave informed consent.

## 2020-02-24 NOTE — Patient Instructions (Signed)

## 2020-02-24 NOTE — H&P (Signed)
Lisa Jenkins; [id number]; 12/23/1950   HPI Patient is a 69 year old white female who was referred to my care by Dr. Delton Coombes of oncology for Port-A-Cath placement.  She was recently diagnosed with endometrial carcinoma and needs central venous access for chemotherapy. Past Medical History:  Diagnosis Date  . Arthritis   . Coronary artery disease    a. s/p NSTEMI in 07/2017 with DES to mid-RCA and residual disease along proximal-mid LAD  . Diabetes mellitus without complication ([place])   . High cholesterol   . Hyperkalemia   . Hypertension   . Myocardial infarction ([place])   . Vitamin D deficiency     Past Surgical History:  Procedure Laterality Date  . ABDOMINAL HYSTERECTOMY    . Bilateral oopherectomy    . BUNIONECTOMY Bilateral   . CORONARY STENT INTERVENTION N/A 07/09/2017   Procedure: CORONARY STENT INTERVENTION;  Surgeon: Belva Crome, MD;  Location: [facility];  Service: Cardiovascular;  Laterality: N/A;  . LEFT HEART CATH AND CORONARY ANGIOGRAPHY N/A 07/09/2017   Procedure: LEFT HEART CATH AND CORONARY ANGIOGRAPHY;  Surgeon: Belva Crome, MD;  Location: [facility];  Service: Cardiovascular;  Laterality: N/A;  . ROBOTIC ASSISTED TOTAL HYSTERECTOMY WITH BILATERAL SALPINGO OOPHERECTOMY Bilateral 01/20/2020   Procedure: XI ROBOTIC ASSISTED TOTAL HYSTERECTOMY, UTERUS GREATER THAN 250 GRAMS WITH BILATERAL SALPINGO OOPHORECTOMY, MINI LAPAROTOMY;  Surgeon: Everitt Amber, MD;  Location: [facility];  Service: Gynecology;  Laterality: Bilateral;    Family History  Problem Relation Age of Onset  . Diabetes Mother   . Heart disease Mother   . Diabetes Sister   . Heart disease Sister   . Diabetes Brother   . Heart disease Brother   . Diabetes Brother   . Heart disease Brother   . Diabetes Brother   . Heart disease Brother   . Diabetes Brother   . Heart disease Brother   . Diabetes Brother   . Heart disease Brother   . Breast cancer Neg Hx   . Ovarian cancer Neg Hx   .  Endometrial cancer Neg Hx   . Colon cancer Neg Hx     Current Outpatient Medications on File Prior to Visit  Medication Sig Dispense Refill  . acetaminophen (TYLENOL) 325 MG tablet Take 2 tablets (650 mg total) by mouth every 6 (six) hours. 20 tablet 0  . aspirin 81 MG chewable tablet Chew 81 mg by mouth daily.     Marland Kitchen atorvastatin (LIPITOR) 80 MG tablet TAKE 1 Tablet BY MOUTH ONCE DAILY AT 6:00PM (Patient taking differently: Take 80 mg by mouth every evening. TAKE 1 Tablet BY MOUTH ONCE DAILY AT 6:00PM) 90 tablet 1  . [START ON 03/02/2020] CARBOPLATIN IV Inject into the vein every 21 ( twenty-one) days.    . chlorthalidone (HYGROTON) 25 MG tablet Take 1 tablet (25 mg total) by mouth daily. 30 tablet 0  . lidocaine-prilocaine (EMLA) cream Apply small amount to port a cath site (do not rub in) and cover with plastic wrap 1 hour prior to chemotherapy appointments 30 g 0  . metFORMIN (GLUCOPHAGE) 1000 MG tablet TAKE 1 Tablet  BY MOUTH TWICE DAILY WITH MEALS (Patient taking differently: Take 1,000 mg by mouth 2 (two) times daily with a meal. ) 60 tablet 0  . metoprolol tartrate (LOPRESSOR) 25 MG tablet TAKE 1 Tablet  BY MOUTH TWICE DAILY (Patient taking differently: Take 25 mg by mouth 2 (two) times daily. ) 60 tablet 0  . OZEMPIC, 1 MG/DOSE,  2 MG/1.5ML SOPN Inject 1 mg into the skin every Thursday.    Derrill Memo ON 03/02/2020] PACLITAXEL IV Inject into the vein every 21 ( twenty-one) days.    . prochlorperazine (COMPAZINE) 10 MG tablet Take 1 tablet (10 mg total) by mouth every 6 (six) hours as needed for nausea or vomiting. 30 tablet 0  . senna-docusate (SENOKOT-S) 8.6-50 MG tablet Take 2 tablets by mouth at bedtime. Take to prevent constipation, do not take if having diarrhea 30 tablet 0  . SYNTHROID 25 MCG tablet TAKE 1 Tablet BY MOUTH ONCE DAILY BEFORE BREAKFAST (Patient taking differently: Take 25 mcg by mouth daily before breakfast. ) 90 tablet 1  . traMADol (ULTRAM) 50 MG tablet Take 1 tablet (50 mg  total) by mouth every 6 (six) hours as needed for severe pain. Do not take and drive 10 tablet 0  . Vitamin D, Ergocalciferol, (DRISDOL) 1.25 MG (50000 UNIT) CAPS capsule Take 50,000 Units by mouth once a week.    Marland Kitchen allopurinol (ZYLOPRIM) 100 MG tablet Take 1 tablet (100 mg total) by mouth daily. 30 tablet 0   No current facility-administered medications on file prior to visit.    No Known Allergies  Social History   Substance and Sexual Activity  Alcohol Use Not Currently  . Alcohol/week: 0.0 standard drinks    Social History   Tobacco Use  Smoking Status Current Every Day Smoker  . Packs/day: 0.50  . Years: 46.00  . Pack years: 23.00  . Types: Cigarettes  Smokeless Tobacco Never Used    Review of Systems  Constitutional: Positive for malaise/fatigue.  HENT: Negative.   Eyes: Negative.   Respiratory: Negative.   Cardiovascular: Negative.   Gastrointestinal: Negative.   Genitourinary: Negative.   Musculoskeletal: Negative.   Skin: Negative.   Neurological: Negative.   Endo/Heme/Allergies: Negative.   Psychiatric/Behavioral: Negative.     Objective   Vitals:   02/24/20 1012  BP: (!) 147/79  Pulse: 64  Resp: 16  Temp: 97.9 F (36.6 C)  SpO2: 96%    Physical Exam Vitals reviewed.  Constitutional:      Appearance: Normal appearance. She is normal weight. She is not ill-appearing.  HENT:     Head: Normocephalic and atraumatic.  Cardiovascular:     Rate and Rhythm: Normal rate and regular rhythm.     Heart sounds: Normal heart sounds. No murmur heard.  No friction rub. No gallop.   Pulmonary:     Effort: Pulmonary effort is normal. No respiratory distress.     Breath sounds: Normal breath sounds. No stridor. No wheezing, rhonchi or rales.  Skin:    General: Skin is warm and dry.  Neurological:     Mental Status: She is alert and oriented to person, place, and time.    Dr. Tomie China notes reviewed Assessment  Endometrial carcinoma, need for  central venous access Plan   Patient is scheduled for Port-A-Cath insertion on 03/01/2020.  The risks and benefits of the procedure including bleeding, infection, and pneumothorax were fully explained to the patient, who gave informed consent.

## 2020-02-25 NOTE — Patient Instructions (Signed)
Lisa Jenkins  02/25/2020     @PREFPERIOPPHARMACY @   Your procedure is scheduled on  03/01/2020  Report to [facility] at  0900  A.M.  Call this number if you have problems the morning of surgery:  [phone]   Remember:  Do not eat or drink after midnight.                         Take these medicines the morning of surgery with A SIP OF WATER  Allopurinol, metoprolol, compazine ( if needed), synthroid, ultram(if needed). Take 10 units of your Insulin the night before your procedure. DO NOT take any medications for diabetes the morning of your procedure.    Do not wear jewelry, make-up or nail polish.  Do not wear lotions, powders, or perfumes, or deodorant. Please brush your teeth.  Do not shave 48 hours prior to surgery.  Men may shave face and neck.  Do not bring valuables to the hospital.  [facility] is not responsible for any belongings or valuables.  Contacts, dentures or bridgework may not be worn into surgery.  Leave your suitcase in the car.  After surgery it may be brought to your room.  For patients admitted to the hospital, discharge time will be determined by your treatment team.  Patients discharged the day of surgery will not be allowed to drive home.   Name and phone number of your driver:   Family Special instructions:  DO NOT smoke the morning of your procedure.  Please read over the following fact sheets that you were given. Anesthesia Post-op Instructions and Care and Recovery After Surgery       Implanted Port Insertion, Care After This sheet gives you information about how to care for yourself after your procedure. Your health care provider may also give you more specific instructions. If you have problems or questions, contact your health care provider. What can I expect after the procedure? After the procedure, it is common to have:  Discomfort at the port insertion site.  Bruising on the skin over the port. This should improve  over 3-4 days. Follow these instructions at home: [facility] care  After your port is placed, you will get a manufacturer's information card. The card has information about your port. Keep this card with you at all times.  Take care of the port as told by your health care provider. Ask your health care provider if you or a family member can get training for taking care of the port at home. A home health care nurse may also take care of the port.  Make sure to remember what type of port you have. Incision care      Follow instructions from your health care provider about how to take care of your port insertion site. Make sure you: ? Wash your hands with soap and water before and after you change your bandage (dressing). If soap and water are not available, use hand sanitizer. ? Change your dressing as told by your health care provider. ? Leave stitches (sutures), skin glue, or adhesive strips in place. These skin closures may need to stay in place for 2 weeks or longer. If adhesive strip edges start to loosen and curl up, you may trim the loose edges. Do not remove adhesive strips completely unless your health care provider tells you to do that.  Check your port insertion site  every day for signs of infection. Check for: ? Redness, swelling, or pain. ? Fluid or blood. ? Warmth. ? Pus or a bad smell. Activity  Return to your normal activities as told by your health care provider. Ask your health care provider what activities are safe for you.  Do not lift anything that is heavier than 10 lb (4.5 kg), or the limit that you are told, until your health care provider says that it is safe. General instructions  Take over-the-counter and prescription medicines only as told by your health care provider.  Do not take baths, swim, or use a hot tub until your health care provider approves. Ask your health care provider if you may take showers. You may only be allowed to take sponge baths.  Do not  drive for 24 hours if you were given a sedative during your procedure.  Wear a medical alert bracelet in case of an emergency. This will tell any health care providers that you have a port.  Keep all follow-up visits as told by your health care provider. This is important. Contact a health care provider if:  You cannot flush your port with saline as directed, or you cannot draw blood from the port.  You have a fever or chills.  You have redness, swelling, or pain around your port insertion site.  You have fluid or blood coming from your port insertion site.  Your port insertion site feels warm to the touch.  You have pus or a bad smell coming from the port insertion site. Get help right away if:  You have chest pain or shortness of breath.  You have bleeding from your port that you cannot control. Summary  Take care of the port as told by your health care provider. Keep the manufacturer's information card with you at all times.  Change your dressing as told by your health care provider.  Contact a health care provider if you have a fever or chills or if you have redness, swelling, or pain around your port insertion site.  Keep all follow-up visits as told by your health care provider. This information is not intended to replace advice given to you by your health care provider. Make sure you discuss any questions you have with your health care provider. Document Revised: 03/19/2018 Document Reviewed: 03/19/2018 Elsevier Patient Education  [place] An implanted port is a device that is placed under the skin. It is usually placed in the chest. The device can be used to give IV medicine, to take blood, or for dialysis. You may have an implanted port if:  You need IV medicine that would be irritating to the small veins in your hands or arms.  You need IV medicines, such as antibiotics, for a long period of time.  You need IV nutrition for a  long period of time.  You need dialysis. Having a port means that your health care provider will not need to use the veins in your arms for these procedures. You may have fewer limitations when using a port than you would if you used other types of long-term IVs, and you will likely be able to return to normal activities after your incision heals. An implanted port has two main parts:  Reservoir. The reservoir is the part where a needle is inserted to give medicines or draw blood. The reservoir is round. After it is placed, it appears as a small, raised area under your skin.  Catheter. The catheter is a thin, flexible tube that connects the reservoir to a vein. Medicine that is inserted into the reservoir goes into the catheter and then into the vein. How is my port accessed? To access your port:  A numbing cream may be placed on the skin over the port site.  Your health care provider will put on a mask and sterile gloves.  The skin over your port will be cleaned carefully with a germ-killing soap and allowed to dry.  Your health care provider will gently pinch the port and insert a needle into it.  Your health care provider will check for a blood return to make sure the port is in the vein and is not clogged.  If your port needs to remain accessed to get medicine continuously (constant infusion), your health care provider will place a clear bandage (dressing) over the needle site. The dressing and needle will need to be changed every week, or as told by your health care provider. What is flushing? Flushing helps keep the port from getting clogged. Follow instructions from your health care provider about how and when to flush the port. Ports are usually flushed with saline solution or a medicine called heparin. The need for flushing will depend on how the port is used:  If the port is only used from time to time to give medicines or draw blood, the port may need to be flushed: ? Before and  after medicines have been given. ? Before and after blood has been drawn. ? As part of routine maintenance. Flushing may be recommended every 4-6 weeks.  If a constant infusion is running, the port may not need to be flushed.  Throw away any syringes in a disposal container that is meant for sharp items (sharps container). You can buy a sharps container from a pharmacy, or you can make one by using an empty hard plastic bottle with a cover. How long will my port stay implanted? The port can stay in for as long as your health care provider thinks it is needed. When it is time for the port to come out, a surgery will be done to remove it. The surgery will be similar to the procedure that was done to put the port in. Follow these instructions at home:   Flush your port as told by your health care provider.  If you need an infusion over several days, follow instructions from your health care provider about how to take care of your port site. Make sure you: ? Wash your hands with soap and water before you change your dressing. If soap and water are not available, use alcohol-based hand sanitizer. ? Change your dressing as told by your health care provider. ? Place any used dressings or infusion bags into a plastic bag. Throw that bag in the trash. ? Keep the dressing that covers the needle clean and dry. Do not get it wet. ? Do not use scissors or sharp objects near the tube. ? Keep the tube clamped, unless it is being used.  Check your port site every day for signs of infection. Check for: ? Redness, swelling, or pain. ? Fluid or blood. ? Pus or a bad smell.  Protect the skin around the port site. ? Avoid wearing bra straps that rub or irritate the site. ? Protect the skin around your port from seat belts. Place a soft pad over your chest if needed.  Bathe or shower as told by your health  care provider. The site may get wet as long as you are not actively receiving an infusion.  Return  to your normal activities as told by your health care provider. Ask your health care provider what activities are safe for you.  Carry a medical alert card or wear a medical alert bracelet at all times. This will let health care providers know that you have an implanted port in case of an emergency. Get help right away if:  You have redness, swelling, or pain at the port site.  You have fluid or blood coming from your port site.  You have pus or a bad smell coming from the port site.  You have a fever. Summary  Implanted ports are usually placed in the chest for long-term IV access.  Follow instructions from your health care provider about flushing the port and changing bandages (dressings).  Take care of the area around your port by avoiding clothing that puts pressure on the area, and by watching for signs of infection.  Protect the skin around your port from seat belts. Place a soft pad over your chest if needed.  Get help right away if you have a fever or you have redness, swelling, pain, drainage, or a bad smell at the port site. This information is not intended to replace advice given to you by your health care provider. Make sure you discuss any questions you have with your health care provider. Document Revised: 12/13/2018 Document Reviewed: 09/23/2016 Elsevier Patient Education  2020 [place] After These instructions provide you with information about caring for yourself after your procedure. Your health care provider may also give you more specific instructions. Your treatment has been planned according to current medical practices, but problems sometimes occur. Call your health care provider if you have any problems or questions after your procedure. What can I expect after the procedure? After your procedure, you may:  Feel sleepy for several hours.  Feel clumsy and have poor balance for several hours.  Feel forgetful about what  happened after the procedure.  Have poor judgment for several hours.  Feel nauseous or vomit.  Have a sore throat if you had a breathing tube during the procedure. Follow these instructions at home: For at least 24 hours after the procedure:      Have a responsible adult stay with you. It is important to have someone help care for you until you are awake and alert.  Rest as needed.  Do not: ? Participate in activities in which you could fall or become injured. ? Drive. ? Use heavy machinery. ? Drink alcohol. ? Take sleeping pills or medicines that cause drowsiness. ? Make important decisions or sign legal documents. ? Take care of children on your own. Eating and drinking  Follow the diet that is recommended by your health care provider.  If you vomit, drink water, juice, or soup when you can drink without vomiting.  Make sure you have little or no nausea before eating solid foods. General instructions  Take over-the-counter and prescription medicines only as told by your health care provider.  If you have sleep apnea, surgery and certain medicines can increase your risk for breathing problems. Follow instructions from your health care provider about wearing your sleep device: ? Anytime you are sleeping, including during daytime naps. ? While taking prescription pain medicines, sleeping medicines, or medicines that make you drowsy.  If you smoke, do not smoke without supervision.  Keep  all follow-up visits as told by your health care provider. This is important. Contact a health care provider if:  You keep feeling nauseous or you keep vomiting.  You feel light-headed.  You develop a rash.  You have a fever. Get help right away if:  You have trouble breathing. Summary  For several hours after your procedure, you may feel sleepy and have poor judgment.  Have a responsible adult stay with you for at least 24 hours or until you are awake and alert. This  information is not intended to replace advice given to you by your health care provider. Make sure you discuss any questions you have with your health care provider. Document Revised: 11/19/2017 Document Reviewed: 12/12/2015 Elsevier Patient Education  [place]. How to Use Chlorhexidine for Bathing Chlorhexidine gluconate (CHG) is a germ-killing (antiseptic) solution that is used to clean the skin. It can get rid of the bacteria that normally live on the skin and can keep them away for about 24 hours. To clean your skin with CHG, you may be given:  A CHG solution to use in the shower or as part of a sponge bath.  A prepackaged cloth that contains CHG. Cleaning your skin with CHG may help lower the risk for infection:  While you are staying in the intensive care unit of the hospital.  If you have a vascular access, such as a central line, to provide short-term or long-term access to your veins.  If you have a catheter to drain urine from your bladder.  If you are on a ventilator. A ventilator is a machine that helps you breathe by moving air in and out of your lungs.  After surgery. What are the risks? Risks of using CHG include:  A skin reaction.  Hearing loss, if CHG gets in your ears.  Eye injury, if CHG gets in your eyes and is not rinsed out.  The CHG product catching fire. Make sure that you avoid smoking and flames after applying CHG to your skin. Do not use CHG:  If you have a chlorhexidine allergy or have previously reacted to chlorhexidine.  On babies younger than 70 months of age. How to use CHG solution  Use CHG only as told by your health care provider, and follow the instructions on the label.  Use the full amount of CHG as directed. Usually, this is one bottle. During a shower Follow these steps when using CHG solution during a shower (unless your health care provider gives you different instructions): 1. Start the shower. 2. Use your normal soap and  shampoo to wash your face and hair. 3. Turn off the shower or move out of the shower stream. 4. Pour the CHG onto a clean washcloth. Do not use any type of brush or rough-edged sponge. 5. Starting at your neck, lather your body down to your toes. Make sure you follow these instructions: ? If you will be having surgery, pay special attention to the part of your body where you will be having surgery. Scrub this area for at least 1 minute. ? Do not use CHG on your head or face. If the solution gets into your ears or eyes, rinse them well with water. ? Avoid your genital area. ? Avoid any areas of skin that have broken skin, cuts, or scrapes. ? Scrub your back and under your arms. Make sure to wash skin folds. 6. Let the lather sit on your skin for 1-2 minutes or as long  as told by your health care provider. 7. Thoroughly rinse your entire body in the shower. Make sure that all body creases and crevices are rinsed well. 8. Dry off with a clean towel. Do not put any substances on your body afterward--such as powder, lotion, or perfume--unless you are told to do so by your health care provider. Only use lotions that are recommended by the manufacturer. 9. Put on clean clothes or pajamas. 10. If it is the night before your surgery, sleep in clean sheets.  During a sponge bath Follow these steps when using CHG solution during a sponge bath (unless your health care provider gives you different instructions): 1. Use your normal soap and shampoo to wash your face and hair. 2. Pour the CHG onto a clean washcloth. 3. Starting at your neck, lather your body down to your toes. Make sure you follow these instructions: ? If you will be having surgery, pay special attention to the part of your body where you will be having surgery. Scrub this area for at least 1 minute. ? Do not use CHG on your head or face. If the solution gets into your ears or eyes, rinse them well with water. ? Avoid your genital  area. ? Avoid any areas of skin that have broken skin, cuts, or scrapes. ? Scrub your back and under your arms. Make sure to wash skin folds. 4. Let the lather sit on your skin for 1-2 minutes or as long as told by your health care provider. 5. Using a different clean, wet washcloth, thoroughly rinse your entire body. Make sure that all body creases and crevices are rinsed well. 6. Dry off with a clean towel. Do not put any substances on your body afterward--such as powder, lotion, or perfume--unless you are told to do so by your health care provider. Only use lotions that are recommended by the manufacturer. 7. Put on clean clothes or pajamas. 8. If it is the night before your surgery, sleep in clean sheets. How to use CHG prepackaged cloths  Only use CHG cloths as told by your health care provider, and follow the instructions on the label.  Use the CHG cloth on clean, dry skin.  Do not use the CHG cloth on your head or face unless your health care provider tells you to.  When washing with the CHG cloth: ? Avoid your genital area. ? Avoid any areas of skin that have broken skin, cuts, or scrapes. Before surgery Follow these steps when using a CHG cloth to clean before surgery (unless your health care provider gives you different instructions): 1. Using the CHG cloth, vigorously scrub the part of your body where you will be having surgery. Scrub using a back-and-forth motion for 3 minutes. The area on your body should be completely wet with CHG when you are done scrubbing. 2. Do not rinse. Discard the cloth and let the area air-dry. Do not put any substances on the area afterward, such as powder, lotion, or perfume. 3. Put on clean clothes or pajamas. 4. If it is the night before your surgery, sleep in clean sheets.  For general bathing Follow these steps when using CHG cloths for general bathing (unless your health care provider gives you different instructions). 1. Use a separate CHG  cloth for each area of your body. Make sure you wash between any folds of skin and between your fingers and toes. Wash your body in the following order, switching to a new cloth after each  step: ? The front of your neck, shoulders, and chest. ? Both of your arms, under your arms, and your hands. ? Your stomach and groin area, avoiding the genitals. ? Your right leg and foot. ? Your left leg and foot. ? The back of your neck, your back, and your buttocks. 2. Do not rinse. Discard the cloth and let the area air-dry. Do not put any substances on your body afterward--such as powder, lotion, or perfume--unless you are told to do so by your health care provider. Only use lotions that are recommended by the manufacturer. 3. Put on clean clothes or pajamas. Contact a health care provider if:  Your skin gets irritated after scrubbing.  You have questions about using your solution or cloth. Get help right away if:  Your eyes become very red or swollen.  Your eyes itch badly.  Your skin itches badly and is red or swollen.  Your hearing changes.  You have trouble seeing.  You have swelling or tingling in your mouth or throat.  You have trouble breathing.  You swallow any chlorhexidine. Summary  Chlorhexidine gluconate (CHG) is a germ-killing (antiseptic) solution that is used to clean the skin. Cleaning your skin with CHG may help to lower your risk for infection.  You may be given CHG to use for bathing. It may be in a bottle or in a prepackaged cloth to use on your skin. Carefully follow your health care provider's instructions and the instructions on the product label.  Do not use CHG if you have a chlorhexidine allergy.  Contact your health care provider if your skin gets irritated after scrubbing. This information is not intended to replace advice given to you by your health care provider. Make sure you discuss any questions you have with your health care provider. Document Revised:  11/07/2018 Document Reviewed: 07/19/2017 Elsevier Patient Education  [place].

## 2020-02-26 ENCOUNTER — Other Ambulatory Visit: Payer: Self-pay | Admitting: Student

## 2020-02-26 NOTE — Telephone Encounter (Signed)
This is a [place] pt.  °

## 2020-02-27 ENCOUNTER — Encounter (HOSPITAL_COMMUNITY): Payer: Self-pay

## 2020-02-27 ENCOUNTER — Encounter (HOSPITAL_COMMUNITY)
Admission: RE | Admit: 2020-02-27 | Discharge: 2020-02-27 | Disposition: A | Discharge status: Home / Self Care | Payer: Medicare HMO | Source: Ambulatory Visit | Attending: General Surgery | Admitting: General Surgery

## 2020-02-27 ENCOUNTER — Other Ambulatory Visit (HOSPITAL_COMMUNITY)
Admission: RE | Admit: 2020-02-27 | Discharge: 2020-02-27 | Disposition: A | Discharge status: Home / Self Care | Payer: Medicare HMO | Source: Ambulatory Visit | Attending: General Surgery | Admitting: General Surgery

## 2020-02-27 ENCOUNTER — Other Ambulatory Visit: Payer: Self-pay

## 2020-02-27 DIAGNOSIS — Z20822 Contact with and (suspected) exposure to covid-19: Secondary | ICD-10-CM | POA: Diagnosis not present

## 2020-02-27 DIAGNOSIS — Z01812 Encounter for preprocedural laboratory examination: Secondary | ICD-10-CM | POA: Insufficient documentation

## 2020-02-27 LAB — CBC WITH DIFFERENTIAL/PLATELET
Abs Immature Granulocytes: 0.04 K/uL (ref 0.00–0.07)
Basophils Absolute: 0.1 K/uL (ref 0.0–0.1)
Basophils Relative: 1 %
Eosinophils Absolute: 0.3 K/uL (ref 0.0–0.5)
Eosinophils Relative: 3 %
HCT: 33.2 % — ABNORMAL LOW (ref 36.0–46.0)
Hemoglobin: 10.3 g/dL — ABNORMAL LOW (ref 12.0–15.0)
Immature Granulocytes: 0 %
Lymphocytes Relative: 26 %
Lymphs Abs: 2.6 K/uL (ref 0.7–4.0)
MCH: 30 pg (ref 26.0–34.0)
MCHC: 31 g/dL (ref 30.0–36.0)
MCV: 96.8 fL (ref 80.0–100.0)
Monocytes Absolute: 1 K/uL (ref 0.1–1.0)
Monocytes Relative: 10 %
Neutro Abs: 5.9 K/uL (ref 1.7–7.7)
Neutrophils Relative %: 60 %
Platelets: 355 K/uL (ref 150–400)
RBC: 3.43 MIL/uL — ABNORMAL LOW (ref 3.87–5.11)
RDW: 14.6 % (ref 11.5–15.5)
WBC: 9.8 K/uL (ref 4.0–10.5)
nRBC: 0 % (ref 0.0–0.2)

## 2020-02-27 LAB — SARS CORONAVIRUS 2 (TAT 6-24 HRS): SARS Coronavirus 2: NEGATIVE

## 2020-02-27 NOTE — Progress Notes (Signed)
Pharmacist Chemotherapy Monitoring - Initial Assessment    Anticipated start date: 03/02/20   Regimen:  . Are orders appropriate based on the patient's diagnosis, regimen, and cycle? Yes . Does the plan date match the patient's scheduled date? Yes . Is the sequencing of drugs appropriate? Yes . Are the premedications appropriate for the patient's regimen? Yes . Prior Authorization for treatment is: Pending o If applicable, is the correct biosimilar selected based on the patient's insurance? not applicable  Organ Function and Labs: Marland Kitchen Are dose adjustments needed based on the patient's renal function, hepatic function, or hematologic function? No . Are appropriate labs ordered prior to the start of patient's treatment? Yes . Other organ system assessment, if indicated: N/A . The following baseline labs, if indicated, have been ordered: N/A  Dose Assessment: . Are the drug doses appropriate? Yes . Are the following correct: o Drug concentrations Yes o IV fluid compatible with drug Yes o Administration routes Yes o Timing of therapy Yes . If applicable, does the patient have documented access for treatment and/or plans for port-a-cath placement? yes . If applicable, have lifetime cumulative doses been properly documented and assessed? not applicable Lifetime Dose Tracking  No doses have been documented on this patient for the following tracked chemicals: Doxorubicin, Epirubicin, Idarubicin, Daunorubicin, Mitoxantrone, Bleomycin, Oxaliplatin, Carboplatin, Liposomal Doxorubicin  o   Toxicity Monitoring/Prevention: . The patient has the following take home antiemetics prescribed: Prochlorperazine . The patient has the following take home medications prescribed: N/A . Medication allergies and previous infusion related reactions, if applicable, have been reviewed and addressed. Yes . The patient's current medication list has been assessed for drug-drug interactions with their chemotherapy  regimen. no significant drug-drug interactions were identified on review.  Order Review: . Are the treatment plan orders signed? Yes . Is the patient scheduled to see a provider prior to their treatment? Yes  I verify that I have reviewed each item in the above checklist and answered each question accordingly.  Romualdo Bolk [facility] 02/27/2020 9:06 AM

## 2020-03-01 ENCOUNTER — Ambulatory Visit (HOSPITAL_COMMUNITY)
Admission: RE | Admit: 2020-03-01 | Discharge: 2020-03-01 | Disposition: A | Discharge status: Home / Self Care | Payer: Medicare HMO | Attending: General Surgery | Admitting: General Surgery

## 2020-03-01 ENCOUNTER — Ambulatory Visit (HOSPITAL_COMMUNITY): Payer: Medicare HMO

## 2020-03-01 ENCOUNTER — Encounter (HOSPITAL_COMMUNITY): Payer: Self-pay | Admitting: General Surgery

## 2020-03-01 ENCOUNTER — Ambulatory Visit (HOSPITAL_COMMUNITY): Payer: Medicare HMO | Admitting: Certified Registered Nurse Anesthetist

## 2020-03-01 ENCOUNTER — Other Ambulatory Visit: Payer: Self-pay

## 2020-03-01 ENCOUNTER — Encounter (HOSPITAL_COMMUNITY)
Admission: RE | Disposition: A | Discharge status: Home / Self Care | Payer: Self-pay | Source: Home / Self Care | Attending: General Surgery

## 2020-03-01 DIAGNOSIS — Z79899 Other long term (current) drug therapy: Secondary | ICD-10-CM | POA: Insufficient documentation

## 2020-03-01 DIAGNOSIS — C541 Malignant neoplasm of endometrium: Secondary | ICD-10-CM | POA: Diagnosis present

## 2020-03-01 DIAGNOSIS — Z955 Presence of coronary angioplasty implant and graft: Secondary | ICD-10-CM | POA: Insufficient documentation

## 2020-03-01 DIAGNOSIS — I2510 Atherosclerotic heart disease of native coronary artery without angina pectoris: Secondary | ICD-10-CM | POA: Diagnosis not present

## 2020-03-01 DIAGNOSIS — Z833 Family history of diabetes mellitus: Secondary | ICD-10-CM | POA: Diagnosis not present

## 2020-03-01 DIAGNOSIS — E7800 Pure hypercholesterolemia, unspecified: Secondary | ICD-10-CM | POA: Insufficient documentation

## 2020-03-01 DIAGNOSIS — I252 Old myocardial infarction: Secondary | ICD-10-CM | POA: Diagnosis not present

## 2020-03-01 DIAGNOSIS — Z95828 Presence of other vascular implants and grafts: Secondary | ICD-10-CM

## 2020-03-01 DIAGNOSIS — Z8249 Family history of ischemic heart disease and other diseases of the circulatory system: Secondary | ICD-10-CM | POA: Insufficient documentation

## 2020-03-01 DIAGNOSIS — Z7982 Long term (current) use of aspirin: Secondary | ICD-10-CM | POA: Diagnosis not present

## 2020-03-01 DIAGNOSIS — Z90710 Acquired absence of both cervix and uterus: Secondary | ICD-10-CM | POA: Diagnosis not present

## 2020-03-01 DIAGNOSIS — Z90722 Acquired absence of ovaries, bilateral: Secondary | ICD-10-CM | POA: Insufficient documentation

## 2020-03-01 DIAGNOSIS — E119 Type 2 diabetes mellitus without complications: Secondary | ICD-10-CM | POA: Diagnosis not present

## 2020-03-01 DIAGNOSIS — F17210 Nicotine dependence, cigarettes, uncomplicated: Secondary | ICD-10-CM | POA: Diagnosis not present

## 2020-03-01 DIAGNOSIS — M1990 Unspecified osteoarthritis, unspecified site: Secondary | ICD-10-CM | POA: Insufficient documentation

## 2020-03-01 DIAGNOSIS — I10 Essential (primary) hypertension: Secondary | ICD-10-CM | POA: Diagnosis not present

## 2020-03-01 DIAGNOSIS — Z7984 Long term (current) use of oral hypoglycemic drugs: Secondary | ICD-10-CM | POA: Diagnosis not present

## 2020-03-01 HISTORY — DX: Malignant (primary) neoplasm, unspecified: C80.1

## 2020-03-01 HISTORY — PX: PORTACATH PLACEMENT: SHX2246

## 2020-03-01 LAB — GLUCOSE, CAPILLARY
Glucose-Capillary: 66 mg/dL — ABNORMAL LOW (ref 70–99)
Glucose-Capillary: 112 mg/dL — ABNORMAL HIGH (ref 70–99)
Glucose-Capillary: 137 mg/dL — ABNORMAL HIGH (ref 70–99)

## 2020-03-01 SURGERY — INSERTION, TUNNELED CENTRAL VENOUS DEVICE, WITH PORT
Anesthesia: General | Site: Chest | Laterality: Left

## 2020-03-01 MED ORDER — CHLORHEXIDINE GLUCONATE 0.12 % MT SOLN: 15.0000 mL | Freq: Once | OROMUCOSAL | Status: DC

## 2020-03-01 MED ORDER — LACTATED RINGERS IV SOLN: INTRAVENOUS | Status: DC

## 2020-03-01 MED ORDER — CHLORHEXIDINE GLUCONATE CLOTH 2 % EX PADS: 6.0000 | MEDICATED_PAD | Freq: Once | CUTANEOUS | Status: DC

## 2020-03-01 MED ORDER — FENTANYL CITRATE (PF) 100 MCG/2ML IJ SOLN
INTRAMUSCULAR | Status: AC
  Filled 2020-03-01: qty 2

## 2020-03-01 MED ORDER — DEXTROSE 50 % IV SOLN
25.0000 mL | Freq: Once | INTRAVENOUS | Status: AC
  Administered 2020-03-01: 25 mL via INTRAVENOUS

## 2020-03-01 MED ORDER — CEFAZOLIN SODIUM-DEXTROSE 2-4 GM/100ML-% IV SOLN
2.0000 g | INTRAVENOUS | Status: AC
  Administered 2020-03-01: 2 g via INTRAVENOUS

## 2020-03-01 MED ORDER — DEXTROSE 50 % IV SOLN
INTRAVENOUS | Status: AC
  Filled 2020-03-01: qty 50

## 2020-03-01 MED ORDER — PROPOFOL 10 MG/ML IV BOLUS
INTRAVENOUS | Status: AC
  Filled 2020-03-01: qty 20

## 2020-03-01 MED ORDER — FENTANYL CITRATE (PF) 100 MCG/2ML IJ SOLN: 25.0000 ug | INTRAMUSCULAR | Status: DC | PRN

## 2020-03-01 MED ORDER — LIDOCAINE HCL (PF) 1 % IJ SOLN
INTRAMUSCULAR | Status: DC | PRN
  Administered 2020-03-01: 5 mL via INTRADERMAL

## 2020-03-01 MED ORDER — HEPARIN SOD (PORK) LOCK FLUSH 100 UNIT/ML IV SOLN
INTRAVENOUS | Status: AC
  Filled 2020-03-01: qty 5

## 2020-03-01 MED ORDER — MIDAZOLAM HCL 2 MG/2ML IJ SOLN
INTRAMUSCULAR | Status: AC
  Filled 2020-03-01: qty 2

## 2020-03-01 MED ORDER — PROPOFOL 500 MG/50ML IV EMUL
INTRAVENOUS | Status: DC | PRN
  Administered 2020-03-01: 200 ug/kg/min via INTRAVENOUS

## 2020-03-01 MED ORDER — FENTANYL CITRATE (PF) 100 MCG/2ML IJ SOLN
INTRAMUSCULAR | Status: DC | PRN
  Administered 2020-03-01: 25 ug via INTRAVENOUS

## 2020-03-01 MED ORDER — ORAL CARE MOUTH RINSE: 15.0000 mL | Freq: Once | OROMUCOSAL | Status: DC

## 2020-03-01 MED ORDER — TRAMADOL HCL 50 MG PO TABS: 50.0000 mg | ORAL_TABLET | Freq: Four times a day (QID) | ORAL | 0 refills | Status: DC | PRN

## 2020-03-01 MED ORDER — SODIUM CHLORIDE (PF) 0.9 % IJ SOLN
INTRAMUSCULAR | Status: DC | PRN
  Administered 2020-03-01: 50 mL via INTRAVENOUS

## 2020-03-01 MED ORDER — ONDANSETRON HCL 4 MG/2ML IJ SOLN: 4.0000 mg | Freq: Once | INTRAMUSCULAR | Status: DC | PRN

## 2020-03-01 MED ORDER — CEFAZOLIN SODIUM-DEXTROSE 2-4 GM/100ML-% IV SOLN
INTRAVENOUS | Status: AC
  Filled 2020-03-01: qty 100

## 2020-03-01 MED ORDER — HEPARIN SOD (PORK) LOCK FLUSH 100 UNIT/ML IV SOLN
INTRAVENOUS | Status: DC | PRN
  Administered 2020-03-01: 500 [IU] via INTRAVENOUS

## 2020-03-01 MED ORDER — LIDOCAINE HCL (PF) 1 % IJ SOLN
INTRAMUSCULAR | Status: AC
  Filled 2020-03-01: qty 30

## 2020-03-01 SURGICAL SUPPLY — 33 items
ADH SKN CLS APL DERMABOND .7 (GAUZE/BANDAGES/DRESSINGS) ×1
APL PRP STRL LF ISPRP CHG 10.5 (MISCELLANEOUS) ×1
APPLICATOR CHLORAPREP 10.5 ORG (MISCELLANEOUS) ×2 IMPLANT
BAG DECANTER FOR FLEXI CONT (MISCELLANEOUS) ×2 IMPLANT
CLOTH BEACON ORANGE TIMEOUT ST (SAFETY) ×2 IMPLANT
COVER LIGHT HANDLE STERIS (MISCELLANEOUS) ×4 IMPLANT
COVER WAND RF STERILE (DRAPES) ×2 IMPLANT
DECANTER SPIKE VIAL GLASS SM (MISCELLANEOUS) ×2 IMPLANT
DERMABOND ADVANCED (GAUZE/BANDAGES/DRESSINGS) ×1
DERMABOND ADVANCED .7 DNX12 (GAUZE/BANDAGES/DRESSINGS) ×1 IMPLANT
DRAPE C-ARM FOLDED MOBILE STRL (DRAPES) ×2 IMPLANT
ELECT REM PT RETURN 9FT ADLT (ELECTROSURGICAL) ×2
ELECTRODE REM PT RTRN 9FT ADLT (ELECTROSURGICAL) ×1 IMPLANT
GLOVE BIO SURGEON STRL SZ7.5 (GLOVE) ×1 IMPLANT
GLOVE BIOGEL PI IND STRL 7.0 (GLOVE) ×2 IMPLANT
GLOVE BIOGEL PI INDICATOR 7.0 (GLOVE) ×2
GLOVE ECLIPSE 7.5 STRL STRAW (GLOVE) ×1 IMPLANT
GLOVE SURG SS PI 7.5 STRL IVOR (GLOVE) ×2 IMPLANT
GOWN STRL REUS W/TWL LRG LVL3 (GOWN DISPOSABLE) ×4 IMPLANT
IV NS 500ML (IV SOLUTION) ×2
IV NS 500ML BAXH (IV SOLUTION) ×1 IMPLANT
KIT PORT POWER 8FR ISP MRI (Port) ×2 IMPLANT
KIT TURNOVER KIT A (KITS) ×2 IMPLANT
NDL HYPO 25X1 1.5 SAFETY (NEEDLE) ×1 IMPLANT
NEEDLE HYPO 25X1 1.5 SAFETY (NEEDLE) ×2 IMPLANT
PACK MINOR (CUSTOM PROCEDURE TRAY) ×1 IMPLANT
PAD ARMBOARD 7.5X6 YLW CONV (MISCELLANEOUS) ×2 IMPLANT
SET BASIN LINEN APH (SET/KITS/TRAYS/PACK) ×2 IMPLANT
SUT MNCRL AB 4-0 PS2 18 (SUTURE) ×2 IMPLANT
SUT VIC AB 3-0 SH 27 (SUTURE) ×2
SUT VIC AB 3-0 SH 27X BRD (SUTURE) ×1 IMPLANT
SYR 5ML LL (SYRINGE) ×2 IMPLANT
SYR CONTROL 10ML LL (SYRINGE) ×2 IMPLANT

## 2020-03-01 NOTE — Anesthesia Postprocedure Evaluation (Signed)
Anesthesia Post Note  Patient: Lisa Jenkins  Procedure(s) Performed: INSERTION PORT-A-CATH (Left )  Patient location during evaluation: PACU Anesthesia Type: General Level of consciousness: awake and alert and patient cooperative Pain management: satisfactory to patient Vital Signs Assessment: post-procedure vital signs reviewed and stable Respiratory status: spontaneous breathing Cardiovascular status: stable Postop Assessment: no apparent nausea or vomiting Anesthetic complications: no   No complications documented.   Last Vitals:  Vitals:   03/01/20 1248 03/01/20 1425  BP: (!) 163/58 (!) 154/75  Pulse: 64 70  Resp: 16 15  Temp: 36.6 C (P) 36.8 C  SpO2:  98%    Last Pain:  Vitals:   03/01/20 1248  TempSrc: Oral  PainSc: 0-No pain                 Ketsia Linebaugh

## 2020-03-01 NOTE — Transfer of Care (Signed)
Immediate Anesthesia Transfer of Care Note  Patient: Lisa Jenkins  Procedure(s) Performed: INSERTION PORT-A-CATH (Left )  Patient Location: PACU  Anesthesia Type:General  Level of Consciousness: awake, alert  and patient cooperative  Airway & Oxygen Therapy: Patient Spontanous Breathing  Post-op Assessment: Report given to RN and Post -op Vital signs reviewed and stable  Post vital signs: Reviewed and stable  Last Vitals:  Vitals Value Taken Time  BP 154/75 03/01/20 1422  Temp    Pulse 70 03/01/20 1424  Resp 15 03/01/20 1424  SpO2 98 % 03/01/20 1424  Vitals shown include unvalidated device data.  Last Pain:  Vitals:   03/01/20 1248  TempSrc: Oral  PainSc: 0-No pain         Complications: No complications documented.

## 2020-03-01 NOTE — Anesthesia Preprocedure Evaluation (Signed)
Anesthesia Evaluation  Patient identified by MRN, date of birth, ID band Patient awake    Reviewed: Allergy & Precautions, H&P , NPO status , Patient's Chart, lab work & pertinent test results, reviewed documented beta blocker date and time   Airway Mallampati: II  TM Distance: >3 FB Neck ROM: full    Dental  (+) Edentulous Upper, Edentulous Lower   Pulmonary neg pulmonary ROS, Current Smoker and Patient abstained from smoking.,    Pulmonary exam normal breath sounds clear to auscultation       Cardiovascular Exercise Tolerance: Good hypertension, + CAD and + Past MI   Rhythm:regular Rate:Normal     Neuro/Psych negative neurological ROS  negative psych ROS   GI/Hepatic negative GI ROS, Neg liver ROS,   Endo/Other  diabetesHypothyroidism   Renal/GU Renal disease  negative genitourinary   Musculoskeletal   Abdominal   Peds  Hematology negative hematology ROS (+)   Anesthesia Other Findings   Reproductive/Obstetrics negative OB ROS                             Anesthesia Physical Anesthesia Plan  ASA: III  Anesthesia Plan: General   Post-op Pain Management:    Induction:   PONV Risk Score and Plan: Propofol infusion  Airway Management Planned:   Additional Equipment:   Intra-op Plan:   Post-operative Plan:   Informed Consent: I have reviewed the patients History and Physical, chart, labs and discussed the procedure including the risks, benefits and alternatives for the proposed anesthesia with the patient or authorized representative who has indicated his/her understanding and acceptance.     Dental Advisory Given  Plan Discussed with: CRNA  Anesthesia Plan Comments:         Anesthesia Quick Evaluation

## 2020-03-01 NOTE — Op Note (Signed)
Patient:  Lisa Jenkins  DOB:  08/21/1951  MRN:  [id number]   Preop Diagnosis: Endometrial carcinoma, need for central venous access  Postop Diagnosis: Same  Procedure: Port-A-Cath insertion  Surgeon: Aviva Signs, MD  Anes: MAC  Indications: Patient is a 69 year old white female who presents for Port-A-Cath insertion.  She is about to undergo chemotherapy for endometrial carcinoma.  The risks and benefits of the procedure including bleeding, infection, and pneumothorax were fully explained to the patient, who gave informed consent.  Procedure note: The patient was placed in the Trendelenburg position after the left upper chest was prepped and draped using the usual sterile technique with ChloraPrep.  Surgical site confirmation was performed.  1% Xylocaine was used for local anesthesia.  An incision was made below the left clavicle.  A subcutaneous pocket was formed.  The needle was advanced into the left subclavian vein using the Seldinger technique without difficulty.  The guidewire was then advanced into the right atrium under fluoroscopic guidance.  An introducer and peel-away sheath were placed over the guidewire.  The guidewire was removed and the cath was inserted through the peel-away sheath.  The peel-away sheath was removed and the catheter was attached to the port and the port placed in subcutaneous pocket.  Adequate positioning was confirmed by fluoroscopy.  Good backflow of venous blood was noted on aspiration of the port.  The port was flushed with heparin flush.  Subcutaneous layer was reapproximated using a 3-0 Vicryl interrupted suture.  The skin was closed using a 4-0 Monocryl subcuticular suture.  Dermabond was applied.  All tape needle counts were correct at the end of the procedure.  The patient was awakened and transferred to PACU in stable condition.  A chest x-ray performed in the PACU revealed no pneumothorax.  Complications: None  EBL: Minimal  Specimen:  None

## 2020-03-01 NOTE — Interval H&P Note (Signed)
History and Physical Interval Note:  03/01/2020 12:53 PM  Lisa Jenkins  has presented today for surgery, with the diagnosis of Endometrial cancer.  The various methods of treatment have been discussed with the patient and family. After consideration of risks, benefits and other options for treatment, the patient has consented to  Procedure(s): INSERTION PORT-A-CATH (Left) as a surgical intervention.  The patient's history has been reviewed, patient examined, no change in status, stable for surgery.  I have reviewed the patient's chart and labs.  Questions were answered to the patient's satisfaction.     Aviva Signs

## 2020-03-01 NOTE — Anesthesia Procedure Notes (Signed)
Date/Time: 03/01/2020 1:48 PM Performed by: Vista Deck, CRNA Pre-anesthesia Checklist: Patient identified, Emergency Drugs available, Suction available, Timeout performed and Patient being monitored Patient Re-evaluated:Patient Re-evaluated prior to induction Oxygen Delivery Method: Non-rebreather mask

## 2020-03-01 NOTE — Discharge Instructions (Signed)
Monitored Anesthesia Care, Care After These instructions provide you with information about caring for yourself after your procedure. Your health care provider may also give you more specific instructions. Your treatment has been planned according to current medical practices, but problems sometimes occur. Call your health care provider if you have any problems or questions after your procedure. What can I expect after the procedure? After your procedure, you may: Feel sleepy for several hours. Feel clumsy and have poor balance for several hours. Feel forgetful about what happened after the procedure. Have poor judgment for several hours. Feel nauseous or vomit. Have a sore throat if you had a breathing tube during the procedure. Follow these instructions at home: For at least 24 hours after the procedure:     Have a responsible adult stay with you. It is important to have someone help care for you until you are awake and alert. Rest as needed. Do not: Participate in activities in which you could fall or become injured. Drive. Use heavy machinery. Drink alcohol. Take sleeping pills or medicines that cause drowsiness. Make important decisions or sign legal documents. Take care of children on your own. Eating and drinking Follow the diet that is recommended by your health care provider. If you vomit, drink water, juice, or soup when you can drink without vomiting. Make sure you have little or no nausea before eating solid foods. General instructions Take over-the-counter and prescription medicines only as told by your health care provider. If you have sleep apnea, surgery and certain medicines can increase your risk for breathing problems. Follow instructions from your health care provider about wearing your sleep device: Anytime you are sleeping, including during daytime naps. While taking prescription pain medicines, sleeping medicines, or medicines that make you drowsy. If you  smoke, do not smoke without supervision. Keep all follow-up visits as told by your health care provider. This is important. Contact a health care provider if: You keep feeling nauseous or you keep vomiting. You feel light-headed. You develop a rash. You have a fever. Get help right away if: You have trouble breathing. Summary For several hours after your procedure, you may feel sleepy and have poor judgment. Have a responsible adult stay with you for at least 24 hours or until you are awake and alert. This information is not intended to replace advice given to you by your health care provider. Make sure you discuss any questions you have with your health care provider. Document Revised: 11/19/2017 Document Reviewed: 12/12/2015 Elsevier Patient Education  2020 [employer] Implanted Port Home Guide An implanted port is a device that is placed under the skin. It is usually placed in the chest. The device can be used to give IV medicine, to take blood, or for dialysis. You may have an implanted port if:  You need IV medicine that would be irritating to the small veins in your hands or arms.  You need IV medicines, such as antibiotics, for a long period of time.  You need IV nutrition for a long period of time.  You need dialysis. Having a port means that your health care provider will not need to use the veins in your arms for these procedures. You may have fewer limitations when using a port than you would if you used other types of long-term IVs, and you will likely be able to return to normal activities after your incision heals. An implanted port has two main parts:  Reservoir. The reservoir is the part where   a needle is inserted to give medicines or draw blood. The reservoir is round. After it is placed, it appears as a small, raised area under your skin.  Catheter. The catheter is a thin, flexible tube that connects the reservoir to a vein. Medicine that is inserted into the  reservoir goes into the catheter and then into the vein. How is my port accessed? To access your port:  A numbing cream may be placed on the skin over the port site.  Your health care provider will put on a mask and sterile gloves.  The skin over your port will be cleaned carefully with a germ-killing soap and allowed to dry.  Your health care provider will gently pinch the port and insert a needle into it.  Your health care provider will check for a blood return to make sure the port is in the vein and is not clogged.  If your port needs to remain accessed to get medicine continuously (constant infusion), your health care provider will place a clear bandage (dressing) over the needle site. The dressing and needle will need to be changed every week, or as told by your health care provider. What is flushing? Flushing helps keep the port from getting clogged. Follow instructions from your health care provider about how and when to flush the port. Ports are usually flushed with saline solution or a medicine called heparin. The need for flushing will depend on how the port is used:  If the port is only used from time to time to give medicines or draw blood, the port may need to be flushed: ? Before and after medicines have been given. ? Before and after blood has been drawn. ? As part of routine maintenance. Flushing may be recommended every 4-6 weeks.  If a constant infusion is running, the port may not need to be flushed.  Throw away any syringes in a disposal container that is meant for sharp items (sharps container). You can buy a sharps container from a pharmacy, or you can make one by using an empty hard plastic bottle with a cover. How long will my port stay implanted? The port can stay in for as long as your health care provider thinks it is needed. When it is time for the port to come out, a surgery will be done to remove it. The surgery will be similar to the procedure that was done  to put the port in. Follow these instructions at home:   Flush your port as told by your health care provider.  If you need an infusion over several days, follow instructions from your health care provider about how to take care of your port site. Make sure you: ? Wash your hands with soap and water before you change your dressing. If soap and water are not available, use alcohol-based hand sanitizer. ? Change your dressing as told by your health care provider. ? Place any used dressings or infusion bags into a plastic bag. Throw that bag in the trash. ? Keep the dressing that covers the needle clean and dry. Do not get it wet. ? Do not use scissors or sharp objects near the tube. ? Keep the tube clamped, unless it is being used.  Check your port site every day for signs of infection. Check for: ? Redness, swelling, or pain. ? Fluid or blood. ? Pus or a bad smell.  Protect the skin around the port site. ? Avoid wearing bra straps that rub or irritate   the site. ? Protect the skin around your port from seat belts. Place a soft pad over your chest if needed.  Bathe or shower as told by your health care provider. The site may get wet as long as you are not actively receiving an infusion.  Return to your normal activities as told by your health care provider. Ask your health care provider what activities are safe for you.  Carry a medical alert card or wear a medical alert bracelet at all times. This will let health care providers know that you have an implanted port in case of an emergency. Get help right away if:  You have redness, swelling, or pain at the port site.  You have fluid or blood coming from your port site.  You have pus or a bad smell coming from the port site.  You have a fever. Summary  Implanted ports are usually placed in the chest for long-term IV access.  Follow instructions from your health care provider about flushing the port and changing bandages  (dressings).  Take care of the area around your port by avoiding clothing that puts pressure on the area, and by watching for signs of infection.  Protect the skin around your port from seat belts. Place a soft pad over your chest if needed.  Get help right away if you have a fever or you have redness, swelling, pain, drainage, or a bad smell at the port site. This information is not intended to replace advice given to you by your health care provider. Make sure you discuss any questions you have with your health care provider. Document Revised: 12/13/2018 Document Reviewed: 09/23/2016 Elsevier Patient Education  2020 [employer]  

## 2020-03-02 ENCOUNTER — Inpatient Hospital Stay (HOSPITAL_COMMUNITY): Payer: Medicare HMO

## 2020-03-02 ENCOUNTER — Encounter (HOSPITAL_COMMUNITY): Payer: Self-pay | Admitting: General Surgery

## 2020-03-02 ENCOUNTER — Inpatient Hospital Stay (HOSPITAL_BASED_OUTPATIENT_CLINIC_OR_DEPARTMENT_OTHER): Payer: Medicare HMO | Admitting: Nurse Practitioner

## 2020-03-02 ENCOUNTER — Ambulatory Visit (HOSPITAL_COMMUNITY)
Admission: RE | Admit: 2020-03-02 | Discharge: 2020-03-02 | Disposition: A | Discharge status: Home / Self Care | Payer: Medicare HMO | Source: Ambulatory Visit | Attending: Gynecologic Oncology | Admitting: Gynecologic Oncology

## 2020-03-02 VITALS — BP 162/61 | HR 72 | Temp 97.5°F | Resp 16

## 2020-03-02 DIAGNOSIS — C549 Malignant neoplasm of corpus uteri, unspecified: Secondary | ICD-10-CM

## 2020-03-02 DIAGNOSIS — C541 Malignant neoplasm of endometrium: Secondary | ICD-10-CM

## 2020-03-02 DIAGNOSIS — Z5111 Encounter for antineoplastic chemotherapy: Secondary | ICD-10-CM | POA: Diagnosis not present

## 2020-03-02 LAB — CBC WITH DIFFERENTIAL/PLATELET
Abs Immature Granulocytes: 0.03 10*3/uL (ref 0.00–0.07)
Basophils Absolute: 0.1 10*3/uL (ref 0.0–0.1)
Basophils Relative: 1 %
Eosinophils Absolute: 0.2 10*3/uL (ref 0.0–0.5)
Eosinophils Relative: 2 %
HCT: 32.8 % — ABNORMAL LOW (ref 36.0–46.0)
Hemoglobin: 10.2 g/dL — ABNORMAL LOW (ref 12.0–15.0)
Immature Granulocytes: 0 %
Lymphocytes Relative: 27 %
Lymphs Abs: 2.8 10*3/uL (ref 0.7–4.0)
MCH: 29.9 pg (ref 26.0–34.0)
MCHC: 31.1 g/dL (ref 30.0–36.0)
MCV: 96.2 fL (ref 80.0–100.0)
Monocytes Absolute: 0.7 10*3/uL (ref 0.1–1.0)
Monocytes Relative: 7 %
Neutro Abs: 6.4 10*3/uL (ref 1.7–7.7)
Neutrophils Relative %: 63 %
Platelets: 407 10*3/uL — ABNORMAL HIGH (ref 150–400)
RBC: 3.41 MIL/uL — ABNORMAL LOW (ref 3.87–5.11)
RDW: 14.9 % (ref 11.5–15.5)
WBC: 10.4 10*3/uL (ref 4.0–10.5)
nRBC: 0 % (ref 0.0–0.2)

## 2020-03-02 LAB — COMPREHENSIVE METABOLIC PANEL
ALT: 10 U/L (ref 0–44)
AST: 13 U/L — ABNORMAL LOW (ref 15–41)
Albumin: 3.3 g/dL — ABNORMAL LOW (ref 3.5–5.0)
Alkaline Phosphatase: 109 U/L (ref 38–126)
Anion gap: 10 (ref 5–15)
BUN: 11 mg/dL (ref 8–23)
CO2: 23 mmol/L (ref 22–32)
Calcium: 8.4 mg/dL — ABNORMAL LOW (ref 8.9–10.3)
Chloride: 107 mmol/L (ref 98–111)
Creatinine, Ser: 0.75 mg/dL (ref 0.44–1.00)
GFR calc Af Amer: >60 mL/min (ref >60)
GFR calc non Af Amer: >60 mL/min (ref >60)
Glucose, Bld: 91 mg/dL (ref 70–99)
Potassium: 4.4 mmol/L (ref 3.5–5.1)
Sodium: 140 mmol/L (ref 135–145)
Total Bilirubin: 0.5 mg/dL (ref 0.3–1.2)
Total Protein: 6.6 g/dL (ref 6.5–8.1)

## 2020-03-02 LAB — MAGNESIUM: Magnesium: 1.3 mg/dL — ABNORMAL LOW (ref 1.7–2.4)

## 2020-03-02 MED ORDER — LIDOCAINE-PRILOCAINE 2.5-2.5 % EX CREA: TOPICAL_CREAM | CUTANEOUS | 3 refills | Status: DC

## 2020-03-02 MED ORDER — SODIUM CHLORIDE 0.9 % IV SOLN
150.0000 mg | Freq: Once | INTRAVENOUS | Status: AC
  Administered 2020-03-02: 150 mg via INTRAVENOUS
  Filled 2020-03-02: qty 150

## 2020-03-02 MED ORDER — IOHEXOL 300 MG/ML  SOLN
75.0000 mL | Freq: Once | INTRAMUSCULAR | Status: AC | PRN
  Administered 2020-03-02: 75 mL via INTRAVENOUS

## 2020-03-02 MED ORDER — HEPARIN SOD (PORK) LOCK FLUSH 100 UNIT/ML IV SOLN
500.0000 [IU] | Freq: Once | INTRAVENOUS | Status: AC | PRN
  Administered 2020-03-02: 500 [IU]

## 2020-03-02 MED ORDER — HEPARIN SOD (PORK) LOCK FLUSH 100 UNIT/ML IV SOLN
INTRAVENOUS | Status: AC
  Administered 2020-03-02: 5 [IU]
  Filled 2020-03-02: qty 5

## 2020-03-02 MED ORDER — SODIUM CHLORIDE 0.9 % IV SOLN
10.0000 mg | Freq: Once | INTRAVENOUS | Status: AC
  Administered 2020-03-02: 10 mg via INTRAVENOUS
  Filled 2020-03-02: qty 10

## 2020-03-02 MED ORDER — PROCHLORPERAZINE MALEATE 10 MG PO TABS: 10.0000 mg | ORAL_TABLET | Freq: Four times a day (QID) | ORAL | 1 refills | Status: DC | PRN

## 2020-03-02 MED ORDER — PALONOSETRON HCL INJECTION 0.25 MG/5ML
0.2500 mg | Freq: Once | INTRAVENOUS | Status: AC
  Administered 2020-03-02: 0.25 mg via INTRAVENOUS
  Filled 2020-03-02: qty 5

## 2020-03-02 MED ORDER — MAGNESIUM SULFATE 2 GM/50ML IV SOLN
2.0000 g | Freq: Once | INTRAVENOUS | Status: AC
  Administered 2020-03-02: 2 g via INTRAVENOUS
  Filled 2020-03-02: qty 50

## 2020-03-02 MED ORDER — SODIUM CHLORIDE 0.9 % IV SOLN: Freq: Once | INTRAVENOUS | Status: AC

## 2020-03-02 MED ORDER — SODIUM CHLORIDE 0.9 % IV SOLN
475.2000 mg | Freq: Once | INTRAVENOUS | Status: AC
  Administered 2020-03-02: 480 mg via INTRAVENOUS
  Filled 2020-03-02: qty 48

## 2020-03-02 MED ORDER — DIPHENHYDRAMINE HCL 50 MG/ML IJ SOLN
50.0000 mg | Freq: Once | INTRAMUSCULAR | Status: AC
  Administered 2020-03-02: 50 mg via INTRAVENOUS
  Filled 2020-03-02: qty 1

## 2020-03-02 MED ORDER — SODIUM CHLORIDE 0.9 % IV SOLN
175.0000 mg/m2 | Freq: Once | INTRAVENOUS | Status: AC
  Administered 2020-03-02: 288 mg via INTRAVENOUS
  Filled 2020-03-02: qty 48

## 2020-03-02 MED ORDER — FAMOTIDINE IN NACL 20-0.9 MG/50ML-% IV SOLN
20.0000 mg | Freq: Once | INTRAVENOUS | Status: AC
  Administered 2020-03-02: 20 mg via INTRAVENOUS
  Filled 2020-03-02: qty 50

## 2020-03-02 MED ORDER — SODIUM CHLORIDE 0.9% FLUSH
10.0000 mL | INTRAVENOUS | Status: DC | PRN
  Administered 2020-03-02: 10 mL

## 2020-03-02 NOTE — Progress Notes (Signed)
[address], [place] [address]   CLINIC:  Medical Oncology/Hematology  PCP:  Lisa Jenkins, Lisa Jenkins [place] [address] [phone]   REASON FOR VISIT: Follow-up for uterine sarcoma   CURRENT THERAPY: Carboplatin and paclitaxel  BRIEF ONCOLOGIC HISTORY:  Oncology History  Uterine sarcoma ([facility])  01/20/2020 Initial Diagnosis   Uterine sarcoma ([facility])   03/02/2020 -  Chemotherapy   The patient had palonosetron (ALOXI) injection 0.25 mg, 0.25 mg, Intravenous,  Once, 1 of 6 cycles pegfilgrastim-jmdb (FULPHILA) injection 6 mg, 6 mg, Subcutaneous,  Once, 1 of 6 cycles CARBOplatin (PARAPLATIN) 480 mg in sodium chloride 0.9 % 250 mL chemo infusion, 480 mg (100 % of original dose 475.2 mg), Intravenous,  Once, 1 of 6 cycles Dose modification:   (original dose 475.2 mg, Cycle 1, Reason: Provider Judgment) fosaprepitant (EMEND) 150 mg in sodium chloride 0.9 % 145 mL IVPB, 150 mg, Intravenous,  Once, 1 of 6 cycles PACLitaxel (TAXOL) 288 mg in sodium chloride 0.9 % 250 mL chemo infusion (> 5m/m2), 175 mg/m2 = 288 mg, Intravenous,  Once, 1 of 6 cycles  for chemotherapy treatment.    Endometrial cancer ([facility]  02/11/2020 Initial Diagnosis   [place]ometrial cancer ([facility]   03/02/2020 -  Chemotherapy   The patient had palonosetron (ALOXI) injection 0.25 mg, 0.25 mg, Intravenous,  Once, 1 of 6 cycles pegfilgrastim-jmdb (FULPHILA) injection 6 mg, 6 mg, Subcutaneous,  Once, 1 of 6 cycles CARBOplatin (PARAPLATIN) 480 mg in sodium chloride 0.9 % 250 mL chemo infusion, 480 mg (100 % of original dose 475.2 mg), Intravenous,  Once, 1 of 6 cycles Dose modification:   (original dose 475.2 mg, Cycle 1, Reason: Provider Judgment) fosaprepitant (EMEND) 150 mg in sodium chloride 0.9 % 145 mL IVPB, 150 mg, Intravenous,  Once, 1 of 6 cycles PACLitaxel (TAXOL) 288 mg in sodium chloride 0.9 % 250 mL chemo infusion (> 836mm2), 175 mg/m2 = 288 mg,  Intravenous,  Once, 1 of 6 cycles  for chemotherapy treatment.       INTERVAL HISTORY:  Lisa Jenkins.o. female returns for routine follow-up for uterine sarcoma patient reports she is ready for her first cycle of treatment.  She reports she is feeling well and has no issues today.  Pain is well under control. Denies any nausea, vomiting, or diarrhea. Denies any new pains. Had not noticed any recent bleeding such as epistaxis, hematuria or hematochezia. Denies recent chest pain on exertion, shortness of breath on minimal exertion, pre-syncopal episodes, or palpitations. Denies any numbness or tingling in hands or feet. Denies any recent fevers, infections, or recent hospitalizations. Patient reports appetite at 100% and energy level at 25%.  She is eating well maintain her weight this time.    REVIEW OF SYSTEMS:  Review of Systems  Psychiatric/Behavioral: Positive for depression.  All other systems reviewed and are negative.    PAST MEDICAL/SURGICAL HISTORY:  Past Medical History:  Diagnosis Date  . Arthritis   . Cancer ([facility]  . Coronary artery disease    a. s/p NSTEMI in 07/2017 with DES to mid-RCA and residual disease along proximal-mid LAD  . Diabetes mellitus without complication ([facility]  . High cholesterol   . Hyperkalemia   . Hypertension   . Myocardial infarction ([facility]  . Vitamin D deficiency    Past Surgical History:  Procedure Laterality Date  . ABDOMINAL HYSTERECTOMY    . Bilateral oopherectomy    .  BUNIONECTOMY Bilateral   . CORONARY STENT INTERVENTION N/A 07/09/2017   Procedure: CORONARY STENT INTERVENTION;  Surgeon: Lisa Crome, MD;  Location: [facility];  Service: Cardiovascular;  Laterality: N/A;  . LEFT HEART CATH AND CORONARY ANGIOGRAPHY N/A 07/09/2017   Procedure: LEFT HEART CATH AND CORONARY ANGIOGRAPHY;  Surgeon: Lisa Crome, MD;  Location: [facility];  Service: Cardiovascular;  Laterality: N/A;  . ROBOTIC ASSISTED TOTAL HYSTERECTOMY  WITH BILATERAL SALPINGO OOPHERECTOMY Bilateral 01/20/2020   Procedure: XI ROBOTIC ASSISTED TOTAL HYSTERECTOMY, UTERUS GREATER THAN 250 GRAMS WITH BILATERAL SALPINGO OOPHORECTOMY, MINI LAPAROTOMY;  Surgeon: Lisa Amber, MD;  Location: [facility];  Service: Gynecology;  Laterality: Bilateral;     SOCIAL HISTORY:  Social History   Socioeconomic History  . Marital status: Divorced    Spouse name: Not on file  . Number of children: Not on file  . Years of education: Not on file  . Highest education level: Not on file  Occupational History  . Occupation: retired  Tobacco Use  . Smoking status: Current Every Day Smoker    Packs/day: 0.50    Years: 46.00    Pack years: 23.00    Types: Cigarettes  . Smokeless tobacco: Never Used  Vaping Use  . Vaping Use: Never used  Substance and Sexual Activity  . Alcohol use: Not Currently    Alcohol/week: 0.0 standard drinks  . Drug use: No  . Sexual activity: Not Currently  Other Topics Concern  . Not on file  Social History Narrative  . Not on file   Social Determinants of Health   Financial Resource Strain: Low Risk   . Difficulty of Paying Living Expenses: Not hard at all  Food Insecurity: No Food Insecurity  . Worried About Charity fundraiser in the Last Year: Never true  . Ran Out of Food in the Last Year: Never true  Transportation Needs: No Transportation Needs  . Lack of Transportation (Medical): No  . Lack of Transportation (Non-Medical): No  Physical Activity: Insufficiently Active  . Days of Exercise per Week: 2 days  . Minutes of Exercise per Session: 10 min  Stress: No Stress Concern Present  . Feeling of Stress : Only a little  Social Connections: Moderately Integrated  . Frequency of Communication with Friends and Family: Twice a week  . Frequency of Social Gatherings with Friends and Family: Twice a week  . Attends Religious Services: More than 4 times per year  . Active Member of Clubs or Organizations: No  . Attends  Archivist Meetings: 1 to 4 times per year  . Marital Status: Divorced  Human resources officer Violence: Not At Risk  . Fear of Current or Ex-Partner: No  . Emotionally Abused: No  . Physically Abused: No  . Sexually Abused: No    FAMILY HISTORY:  Family History  Problem Relation Age of Onset  . Diabetes Mother   . Heart disease Mother   . Diabetes Sister   . Heart disease Sister   . Diabetes Brother   . Heart disease Brother   . Diabetes Brother   . Heart disease Brother   . Diabetes Brother   . Heart disease Brother   . Diabetes Brother   . Heart disease Brother   . Diabetes Brother   . Heart disease Brother   . Breast cancer Neg Hx   . Ovarian cancer Neg Hx   . Endometrial cancer Neg Hx   . Colon cancer Neg  Hx     CURRENT MEDICATIONS:  Outpatient Encounter Medications as of 03/02/2020  Medication Sig Note  . allopurinol (ZYLOPRIM) 100 MG tablet Take 1 tablet (100 mg total) by mouth daily. 02/24/2020: Awaiting refill authorization from MD  . aspirin 81 MG chewable tablet Chew 81 mg by mouth daily.    Marland Kitchen atorvastatin (LIPITOR) 80 MG tablet TAKE 1 Tablet BY MOUTH ONCE DAILY AT 6:00PM (Patient taking differently: Take 80 mg by mouth daily at 6 PM. TAKE 1 Tablet BY MOUTH ONCE DAILY AT 6:00PM)   . CARBOPLATIN IV Inject into the vein every 21 ( twenty-one) days. 02/24/2020: POST OPERATIVELY  . chlorthalidone (HYGROTON) 25 MG tablet Take 1 tablet by mouth once daily   . insulin degludec (TRESIBA FLEXTOUCH) 100 UNIT/ML FlexTouch Pen Inject 20 Units into the skin daily. 03/01/2020: Only took 10 units  . metFORMIN (GLUCOPHAGE) 1000 MG tablet TAKE 1 Tablet  BY MOUTH TWICE DAILY WITH MEALS (Patient taking differently: Take 1,000 mg by mouth 2 (two) times daily with a meal. )   . metoprolol tartrate (LOPRESSOR) 25 MG tablet TAKE 1 Tablet  BY MOUTH TWICE DAILY (Patient taking differently: Take 25 mg by mouth 2 (two) times daily. )   . OZEMPIC, 1 MG/DOSE, 2 MG/1.5ML SOPN Inject 1 mg into  the skin every Thursday.   Marland Kitchen PACLITAXEL IV Inject into the vein every 21 ( twenty-one) days. 02/24/2020: POST OPERATIVELY  . senna-docusate (SENOKOT-S) 8.6-50 MG tablet Take 2 tablets by mouth at bedtime. Take to prevent constipation, do not take if having diarrhea (Patient taking differently: Take 2 tablets by mouth at bedtime as needed (constipation.). do not take if having diarrhea)   . SYNTHROID 25 MCG tablet TAKE 1 Tablet BY MOUTH ONCE DAILY BEFORE BREAKFAST (Patient taking differently: Take 25 mcg by mouth daily before breakfast. )   . Vitamin D, Ergocalciferol, (DRISDOL) 1.25 MG (50000 UNIT) CAPS capsule Take 50,000 Units by mouth every Tuesday.    . [DISCONTINUED] lidocaine-prilocaine (EMLA) cream Apply to affected area once   . acetaminophen (TYLENOL) 325 MG tablet Take 2 tablets (650 mg total) by mouth every 6 (six) hours. (Patient not taking: Reported on 03/02/2020)   . lidocaine-prilocaine (EMLA) cream Apply small amount to port a cath site (do not rub in) and cover with plastic wrap 1 hour prior to chemotherapy appointments (Patient not taking: Reported on 03/02/2020) 02/24/2020: Post operatively  . prochlorperazine (COMPAZINE) 10 MG tablet Take 1 tablet (10 mg total) by mouth every 6 (six) hours as needed (Nausea or vomiting). (Patient not taking: Reported on 03/02/2020)   . traMADol (ULTRAM) 50 MG tablet Take 1 tablet (50 mg total) by mouth every 6 (six) hours as needed for severe pain. Do not take and drive (Patient not taking: Reported on 03/02/2020)   . [DISCONTINUED] prochlorperazine (COMPAZINE) 10 MG tablet Take 1 tablet (10 mg total) by mouth every 6 (six) hours as needed for nausea or vomiting. (Patient not taking: Reported on 03/02/2020) 02/24/2020: POST OPERATIVELY WITH CHEMOTHERAPY   Facility-Administered Encounter Medications as of 03/02/2020  Medication  . heparin lock flush 100 UNIT/ML injection  . [COMPLETED] iohexol (OMNIPAQUE) 300 MG/ML solution 75 mL    ALLERGIES:  No Known  Allergies   PHYSICAL EXAM:  ECOG Performance status: 1  Vitals:   03/02/20 0800  BP: (!) 144/63  Pulse: 86  Resp: 18  Temp: 98.2 F (36.8 C)  SpO2: 99%   Filed Weights   03/02/20 0800  Weight: 137 lb (62.1  kg)   Physical Exam Constitutional:      Appearance: Normal appearance. She is normal weight.  Cardiovascular:     Rate and Rhythm: Normal rate and regular rhythm.     Heart sounds: Normal heart sounds.  Pulmonary:     Effort: Pulmonary effort is normal.     Breath sounds: Normal breath sounds.  Abdominal:     General: Bowel sounds are normal.     Palpations: Abdomen is soft.  Musculoskeletal:        General: Normal range of motion.  Skin:    General: Skin is warm.  Neurological:     Mental Status: She is alert and oriented to person, place, and time. Mental status is at baseline.  Psychiatric:        Mood and Affect: Mood normal.        Behavior: Behavior normal.        Thought Content: Thought content normal.        Judgment: Judgment normal.      LABORATORY DATA:  I have reviewed the labs as listed.  CBC    Component Value Date/Time   WBC 10.4 03/02/2020 0820   RBC 3.41 (L) 03/02/2020 0820   HGB 10.2 (L) 03/02/2020 0820   HGB 11.5 07/19/2017 1128   HCT 32.8 (L) 03/02/2020 0820   HCT 35.7 07/19/2017 1128   PLT 407 (H) 03/02/2020 0820   PLT 409 (H) 07/19/2017 1128   MCV 96.2 03/02/2020 0820   MCV 94 07/19/2017 1128   MCH 29.9 03/02/2020 0820   MCHC 31.1 03/02/2020 0820   RDW 14.9 03/02/2020 0820   RDW 14.1 07/19/2017 1128   LYMPHSABS 2.8 03/02/2020 0820   MONOABS 0.7 03/02/2020 0820   EOSABS 0.2 03/02/2020 0820   BASOSABS 0.1 03/02/2020 0820   CMP Latest Ref Rng & Units 03/02/2020 02/11/2020 01/21/2020  Glucose 70 - 99 mg/dL 91 125(H) 373(H)  BUN 8 - 23 mg/dL 11 12 -  Creatinine 0.44 - 1.00 mg/dL 0.75 0.86 -  Sodium 135 - 145 mmol/L 140 142 -  Potassium 3.5 - 5.1 mmol/L 4.4 4.6 -  Chloride 98 - 111 mmol/L 107 106 -  CO2 22 - 32 mmol/L 23 24  -  Calcium 8.9 - 10.3 mg/dL 8.4(L) 9.4 -  Total Protein 6.5 - 8.1 g/dL 6.6 - -  Total Bilirubin 0.3 - 1.2 mg/dL 0.5 - -  Alkaline Phos 38 - 126 U/L 109 - -  AST 15 - 41 U/L 13(L) - -  ALT 0 - 44 U/L 10 - -    All questions were answered to patient's stated satisfaction. Encouraged patient to call with any new concerns or questions before his next visit to the cancer center and we can certain see him sooner, if needed.     ASSESSMENT & PLAN:  Uterine sarcoma ([facility]) 1.  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 -CTAP on 01/07/2020 shows a large heterogeneous cystic and solid pelvic mass.  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  Intermediate 11 mm right adrenal nodule.  Small bilateral renal cysts. -TAH and BSO on 01/20/2020 shows high-grade carcinosarcoma with tumor invading through myometrium and involves the serosa. -MMR was normal. -Patient had a CT of the chest today on 03/02/2020.  Results pending. -She was evaluated by Dr. Denman George who recommended adjuvant chemotherapy with 6 cycles of carboplatin and paclitaxel along with intercalated vaginal brachytherapy. -She had port placed last week. -Cycle  1 on 03/02/2020 of carboplatin and paclitaxel. -Labs done on 03/02/2020 showed creatinine 0.75, potassium 4.4, WBC 10.4, hemoglobin 10.2, platelets 407  2.  Diabetes: -Continue Metformin 1000 mg twice daily. -Continue Ozempic 2 mg subcu weekly  3.  Hypothyroidism: -Continue Synthroid 25 mcg daily  4.  Hypertension: -Continue chlorthalidone 25 mg daily and Lopressor 25 mg twice daily.     Orders placed this encounter:  Orders Placed This Encounter  Procedures  . Lactate dehydrogenase  . Magnesium  . CBC with Differential/Platelet  . Comprehensive metabolic panel      Francene Finders, FNP-C [facility] [phone]

## 2020-03-02 NOTE — Assessment & Plan Note (Signed)
1.  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 -CTAP on 01/07/2020 shows a large heterogeneous cystic and solid pelvic mass.  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  Intermediate 11 mm right adrenal nodule.  Small bilateral renal cysts. -TAH and BSO on 01/20/2020 shows high-grade carcinosarcoma with tumor invading through myometrium and involves the serosa. -MMR was normal. -Patient had a CT of the chest today on 03/02/2020.  Results pending. -She was evaluated by Dr. Denman George who recommended adjuvant chemotherapy with 6 cycles of carboplatin and paclitaxel along with intercalated vaginal brachytherapy. -She had port placed last week. -Cycle 1 on 03/02/2020 of carboplatin and paclitaxel. -Labs done on 03/02/2020 showed creatinine 0.75, potassium 4.4, WBC 10.4, hemoglobin 10.2, platelets 407  2.  Diabetes: -Continue Metformin 1000 mg twice daily. -Continue Ozempic 2 mg subcu weekly  3.  Hypothyroidism: -Continue Synthroid 25 mcg daily  4.  Hypertension: -Continue chlorthalidone 25 mg daily and Lopressor 25 mg twice daily.

## 2020-03-02 NOTE — Patient Instructions (Signed)
[facility] at [facility] Discharge Instructions     Thank you for choosing [facility] at [facility] to provide your oncology and hematology care.  To afford each patient quality time with our provider, please arrive at least 15 minutes before your scheduled appointment time.   If you have a lab appointment with the Cancer Center please come in thru the Main Entrance and check in at the main information desk.  You need to re-schedule your appointment should you arrive 10 or more minutes late.  We strive to give you quality time with our providers, and arriving late affects you and other patients whose appointments are after yours.  Also, if you no show three or more times for appointments you may be dismissed from the clinic at the providers discretion.     Again, thank you for choosing [facility].  Our hope is that these requests will decrease the amount of time that you wait before being seen by our physicians.       _____________________________________________________________  Should you have questions after your visit to [facility], please contact our office at [phone] between the hours of 8:00 a.m. and 4:30 p.m.  Voicemails left after 4:00 p.m. will not be returned until the following business day.  For prescription refill requests, have your pharmacy contact our office and allow 72 hours.    Due to Covid, you will need to wear a mask upon entering the hospital. If you do not have a mask, a mask will be given to you at the Main Entrance upon arrival. For doctor visits, patients may have 1 support person with them. For treatment visits, patients can not have anyone with them due to social distancing guidelines and our immunocompromised population.      

## 2020-03-02 NOTE — Progress Notes (Signed)
Magnesium 1.3 today.  Reviewed labs with Francene Finders, NP, with verbal order Ok to treat today.    Chemotherapy consent signed.  Chemotherapy information printed and given for review and to carry home with all questions asked and answered.  Family at side.  No s/s of distress noted.    Patient tolerated chemotherapy with no complaints voiced.  Side effects with management reviewed with understanding verbalized.  Port site clean and dry with no bruising or swelling noted at site.  Good blood return noted before and after administration of chemotherapy.  Band aid applied.  Patient left in satisfactory condition with VSS and no s/s of distress noted.

## 2020-03-03 ENCOUNTER — Telehealth: Payer: Self-pay | Admitting: Family Medicine

## 2020-03-03 ENCOUNTER — Telehealth: Payer: Self-pay | Admitting: Gynecologic Oncology

## 2020-03-03 ENCOUNTER — Telehealth (HOSPITAL_COMMUNITY): Payer: Self-pay

## 2020-03-03 NOTE — Telephone Encounter (Signed)
   Lisa Jenkins DOB: 1951-05-14 MRN: [id number]   RIDER WAIVER AND RELEASE OF LIABILITY  For purposes of improving physical access to our facilities, [facility] is pleased to partner with third parties to provide [facility] patients or other authorized individuals the option of convenient, on-demand ground transportation services (the Technical brewer") through use of the technology service that enables users to request on-demand ground transportation from independent third-party providers.  By opting to use and accept these [employer], I, the undersigned, hereby agree on behalf of myself, and on behalf of any minor child using the [employer] for whom I am the parent or legal guardian, as follows:  1. Government social research officer provided to me are provided by independent third-party transportation providers who are not [employer] or employees and who are unaffiliated with [employer]. 2. [facility] is neither a transportation carrier nor a common or public carrier. 3. [facility] has no control over the quality or safety of the transportation that occurs as a result of the [employer]. 4. [facility] cannot guarantee that any third-party transportation provider will complete any arranged transportation service. 5. [facility] makes no representation, warranty, or guarantee regarding the reliability, timeliness, quality, safety, suitability, or availability of any of the Transport Services or that they will be error free. 6. I fully understand that traveling by vehicle involves risks and dangers of serious bodily injury, including permanent disability, paralysis, and death. I agree, on behalf of myself and on behalf of any minor child using the Transport Services for whom I am the parent or legal guardian, that the entire risk arising out of my use of the [employer] remains solely with me, to the maximum extent permitted under applicable law. 7. The [employer]  are provided "as is" and "as available." [facility] disclaims all representations and warranties, express, implied or statutory, not expressly set out in these terms, including the implied warranties of merchantability and fitness for a particular purpose. 8. I hereby waive and release [facility], its agents, employees, officers, directors, representatives, insurers, attorneys, assigns, successors, subsidiaries, and affiliates from any and all past, present, or future claims, demands, liabilities, actions, causes of action, or suits of any kind directly or indirectly arising from acceptance and use of the [employer]. 9. I further waive and release [facility] and its affiliates from all present and future liability and responsibility for any injury or death to persons or damages to property caused by or related to the use of the [employer]. 10. I have read this Waiver and Release of Liability, and I understand the terms used in it and their legal significance. This Waiver is freely and voluntarily given with the understanding that my right (as well as the right of any minor child for whom I am the parent or legal guardian using the [employer]) to legal recourse against [facility] in connection with the [employer] is knowingly surrendered in return for use of these services.   I attest that I read the consent document to Lisa Jenkins, gave Lisa Jenkins the opportunity to ask questions and answered the questions asked (if any). I affirm that Lisa Jenkins then provided consent for she's participation in this program.     Lisa Jenkins

## 2020-03-03 NOTE — Progress Notes (Signed)
Patient here for a consult with Dr. Sondra Come.   Everitt Amber, MD  Physician  GYN Oncology  Progress Notes     Signed  Encounter Date:  02/11/2020          Signed         Show:Clear all [x] Manual[x] Template[x] Copied  Added by: [x] Rossi, Emma, MD  [] Hover for details Follow-up Note: Gyn-Onc  Consult was requested by Dr. Anda Latina for the evaluation of CANDITA BORENSTEIN 69 y.o. female  CC:      Chief Complaint  Patient presents with  . Uterine sarcoma ([place])    Post-Op visit    Assessment/Plan:  Ms. ETHNE JEON  is a 69 y.o.  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  MMR normal.  I counseled Catheryne and her friend regarding her diagnosis.  I explained that the recommended course of treatment for this stage high-grade endometrial cancer was adjuvant chemotherapy for 6 cycles with carboplatin and paclitaxel.  I explained this was associated with a 40% recurrence risk.  She is at high risk for vaginal recurrence and therefore I also recommend vaginal brachytherapy intercalated within her chemotherapy treatments in order to reduce the risk of vaginal site recurrences.  We will obtain a CT of the chest to evaluate for distant metastatic disease.  Referrals have been made to medical oncology and radiation oncology for evaluation and consideration of treatment.  Her case will be discussed at our multidisciplinary conference.  From a postop standpoint she is cleared to assume adjuvant therapy.  HPI: Ms Debbora Ang is a 69 year old P0 who was seen in consultation at the request of Dr 67 for evaluation of a pelvic mass and elevated CA 125.  The patient reported a 1 month history of developing lower abdominal generalized pain.  This prompted her to be seen in the emergency department at [facility] on Jan 07, 2020.  At that time a CT scan of the abdomen and pelvis with IV contrast was ordered.  It demonstrated an  interval development of an 11 x 8.6 x 8.9 cm heterogeneous cystic and solid pelvic mass predominantly on the left.  The CT scan was unable to identify whether this was uterine or ovarian in origin.  There was no associated ascites, peritoneal carcinomatosis, or lymphadenopathy.  Additional CT findings included a stable 2.9 cm left adrenal mass, and an exophytic lesion from the posterior lower pole of the right kidney.  There was an additional 11 mm right adrenal gland nodule identified. Ca1 25 was drawn on 01/08/2020 and was elevated at 87.7.  During that hospitalization she experienced acute kidney injury, possibly secondary to administration of IV contrast.  This was associated with hyperkalemia.  This was treated with supportive medical care including IV hydration and her condition returned to normal within 4 days.  The patient's medical history is remarkable for type 2 diabetes mellitus which is poorly controlled.  Hemoglobin A1c during the month of May 2021 was 8.7.  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  Her primary care physician Dr. June 2021 manages her blood glucose and insulin regimen.  The patient has a coronary vascular disease history of a myocardial infarction at age 42, July 09, 2017.  This was managed at [facility] with coronary angiogram and placement of a stent.  She was never placed on Plavix post procedure however has been on aspirin  since that time (81 mg).  Dr. Tamala Julian performed her coronary angioplasty in 2018.  Dr. Bronson Ing is her cardiologist in [place].  She saw her her cardiologist for routine visit in October 2020 through a virtual visit.  She reported no concerning symptoms including no shortness of breath, no chest pain, no orthopnea.  No interventions were necessary after that visit.  It is noted that at the time of her MI in 2018 she underwent an echocardiogram which showed an  ejection fraction of 55%.  She has chronic kidney disease as outlined above.  While her creatinine is within normal range at rest, she experienced AKI after administration of IV contrast in May 2021.  Past surgical history is unremarkable with no prior abdominal surgeries.  She has never been pregnant.  She has been married twice.  She has had Pap smears during her reproductive life with no abnormal Pap tests.  She experienced menopause in her 8s.  She denies postmenopausal bleeding.  The patient has a history of tobacco use.  She lives with her sister-in-law.  She does not work outside of the home.  She is of excellent functional status and is ambulatory through the day without assistance.  She is able to climb a flight of stairs without shortness of breath or weakness.  She is independent with activities of daily living.  Interval Hx:  On 01/20/20 she underwent robotic assisted total hysterectomy for uterus greater than 250 g, BSO, mini laparotomy for specimen delivery. Intraoperative findings were significant for a 16 cm globular uterus with smooth masses (not consistent with fibroids) erupting from the posterior surface and adherent to the sigmoid colon mesentery.  Grossly normal ovaries and fallopian tubes.  Highly vascular uterine tumor.  Normal omentum and upper abdomen.  No gross extrauterine disease other than the vascular pelvic tumor adhesions to the uterus. Surgery was complicated by an EBL of 250 cc which is greater than anticipated.  Final pathology revealed carcinosarcoma, spanning 15 cm, tumor involving the full-thickness of the myometrium and involving the uterine serosa.  No ovarian or fallopian tube involvement.  No LVSI identified.  Lymph nodes not examined or removed.  She was determined to have stage IIIa high-grade carcinosarcoma of the uterus and recommendation for adjuvant chemotherapy and vaginal radiation therapy was recommended.  Since surgery she has done overall well  with some pain in her incisions with coughing, she denies symptoms of infection.  Current Meds:      Outpatient Encounter Medications as of 02/11/2020  Medication Sig  . acetaminophen (TYLENOL) 325 MG tablet Take 2 tablets (650 mg total) by mouth every 6 (six) hours.  Marland Kitchen allopurinol (ZYLOPRIM) 100 MG tablet Take 1 tablet (100 mg total) by mouth daily.  Marland Kitchen aspirin 81 MG chewable tablet Chew 81 mg by mouth daily.   Marland Kitchen atorvastatin (LIPITOR) 80 MG tablet TAKE 1 Tablet BY MOUTH ONCE DAILY AT 6:00PM (Patient taking differently: Take 80 mg by mouth every evening. TAKE 1 Tablet BY MOUTH ONCE DAILY AT 6:00PM)  . chlorthalidone (HYGROTON) 25 MG tablet Take 1 tablet (25 mg total) by mouth daily.  . metFORMIN (GLUCOPHAGE) 1000 MG tablet TAKE 1 Tablet  BY MOUTH TWICE DAILY WITH MEALS (Patient taking differently: Take 1,000 mg by mouth 2 (two) times daily with a meal. )  . metoprolol tartrate (LOPRESSOR) 25 MG tablet TAKE 1 Tablet  BY MOUTH TWICE DAILY (Patient taking differently: Take 25 mg by mouth 2 (two) times daily. )  . OZEMPIC, 1 MG/DOSE, 2  MG/1.5ML SOPN Inject 1 mg into the skin every Thursday.  . senna-docusate (SENOKOT-S) 8.6-50 MG tablet Take 2 tablets by mouth at bedtime. Take to prevent constipation, do not take if having diarrhea  . SYNTHROID 25 MCG tablet TAKE 1 Tablet BY MOUTH ONCE DAILY BEFORE BREAKFAST (Patient taking differently: Take 25 mcg by mouth daily before breakfast. )  . traMADol (ULTRAM) 50 MG tablet Take 1 tablet (50 mg total) by mouth every 6 (six) hours as needed for severe pain. Do not take and drive  . TRESIBA FLEXTOUCH 100 UNIT/ML FlexTouch Pen Inject 0.1 mLs (10 Units total) into the skin every morning.  . Vitamin D, Ergocalciferol, (DRISDOL) 1.25 MG (50000 UNIT) CAPS capsule Take 50,000 Units by mouth once a week.  . [DISCONTINUED] traMADol (ULTRAM) 50 MG tablet Take 1 tablet (50 mg total) by mouth every 6 (six) hours as needed for severe pain. Do not take and drive  .  [DISCONTINUED] nitrofurantoin, macrocrystal-monohydrate, (MACROBID) 100 MG capsule Take 1 capsule (100 mg total) by mouth 2 (two) times daily.   No facility-administered encounter medications on file as of 02/11/2020.    Allergy: No Known Allergies  Social Hx:   Social History        Socioeconomic History  . Marital status: Single    Spouse name: Not on file  . Number of children: Not on file  . Years of education: Not on file  . Highest education level: Not on file  Occupational History  . Not on file  Tobacco Use  . Smoking status: Current Every Day Smoker    Packs/day: 0.50    Years: 46.00    Pack years: 23.00    Types: Cigarettes  . Smokeless tobacco: Never Used  Substance and Sexual Activity  . Alcohol use: Not Currently    Alcohol/week: 0.0 standard drinks  . Drug use: No  . Sexual activity: Not Currently  Other Topics Concern  . Not on file  Social History Narrative  . Not on file   Social Determinants of Health      Financial Resource Strain:   . Difficulty of Paying Living Expenses:   Food Insecurity:   . Worried About Charity fundraiser in the Last Year:   . Arboriculturist in the Last Year:   Transportation Needs:   . Film/video editor (Medical):   Marland Kitchen Lack of Transportation (Non-Medical):   Physical Activity:   . Days of Exercise per Week:   . Minutes of Exercise per Session:   Stress:   . Feeling of Stress :   Social Connections:   . Frequency of Communication with Friends and Family:   . Frequency of Social Gatherings with Friends and Family:   . Attends Religious Services:   . Active Member of Clubs or Organizations:   . Attends Archivist Meetings:   Marland Kitchen Marital Status:   Intimate Partner Violence:   . Fear of Current or Ex-Partner:   . Emotionally Abused:   Marland Kitchen Physically Abused:   . Sexually Abused:     Past Surgical Hx:       Past Surgical History:  Procedure Laterality Date  . BUNIONECTOMY Bilateral    . CORONARY STENT INTERVENTION N/A 07/09/2017   Procedure: CORONARY STENT INTERVENTION;  Surgeon: Belva Crome, MD;  Location: [facility];  Service: Cardiovascular;  Laterality: N/A;  . LEFT HEART CATH AND CORONARY ANGIOGRAPHY N/A 07/09/2017   Procedure: LEFT HEART CATH AND CORONARY ANGIOGRAPHY;  Surgeon: Belva Crome, MD;  Location: [facility];  Service: Cardiovascular;  Laterality: N/A;  . ROBOTIC ASSISTED TOTAL HYSTERECTOMY WITH BILATERAL SALPINGO OOPHERECTOMY Bilateral 01/20/2020   Procedure: XI ROBOTIC ASSISTED TOTAL HYSTERECTOMY, UTERUS GREATER THAN 250 GRAMS WITH BILATERAL SALPINGO OOPHORECTOMY, MINI LAPAROTOMY;  Surgeon: Everitt Amber, MD;  Location: [facility];  Service: Gynecology;  Laterality: Bilateral;    Past Medical Hx:      Past Medical History:  Diagnosis Date  . Arthritis   . Coronary artery disease    a. s/p NSTEMI in 07/2017 with DES to mid-RCA and residual disease along proximal-mid LAD  . Diabetes mellitus without complication ([place])   . High cholesterol   . Hyperkalemia   . Hypertension   . Myocardial infarction ([place])   . Vitamin D deficiency     Past Gynecological History:  See HPI No LMP recorded. Patient is postmenopausal.  Family Hx:       Family History  Problem Relation Age of Onset  . Diabetes Mother   . Heart disease Mother   . Heart disease Father   . Diabetes Sister   . Heart disease Sister   . Diabetes Brother   . Heart disease Brother   . Diabetes Brother   . Heart disease Brother   . Diabetes Brother   . Heart disease Brother   . Diabetes Brother   . Heart disease Brother   . Diabetes Brother   . Heart disease Brother   . Breast cancer Neg Hx   . Ovarian cancer Neg Hx   . Endometrial cancer Neg Hx   . Colon cancer Neg Hx     Review of Systems:  Constitutional  Feels fatigued  ENT Normal appearing ears and nares bilaterally Skin/Breast  No rash, sores, jaundice, itching,  dryness Cardiovascular  No chest pain, shortness of breath, or edema  Pulmonary  No cough or wheeze.  Gastro Intestinal  No nausea, vomitting, or diarrhoea. No pain Genito Urinary  No frequency, urgency, dysuria, no bleeding Musculo Skeletal  No myalgia, arthralgia, joint swelling or pain  Neurologic  No weakness, numbness, change in gait,  Psychology  No depression, anxiety, insomnia.   Vitals:  Blood pressure (!) 130/58, pulse 67, temperature 98.2 F (36.8 C), temperature source Oral, resp. rate 17, height 5' 3"  (1.6 m), weight 135 lb 8 oz (61.5 kg), SpO2 99 %.  Physical Exam: WD in NAD Neck  Supple NROM, without any enlargements.  Lymph Node Survey No cervical supraclavicular or inguinal adenopathy Cardiovascular  Pulse normal rate, regularity and rhythm. S1 and S2 normal.  Lungs  Clear to auscultation bilateraly, without wheezes/crackles/rhonchi. Good air movement.  Skin  No rash/lesions/breakdown. Echymoses on arms from recent hospitalization.  Psychiatry  Alert and oriented to person, place, and time  Abdomen  Normoactive bowel sounds, abdomen soft, non-tender and nonobese without evidence of hernia. Well healed incisions. Very mild erythema on lateral aspect of minilaparotomy (left). But no purulence/drainage or cellulitis. Back No CVA tenderness Genito Urinary  Vaginal cuff smooth, in tact, no bleeding, no lesions or masses palpable Rectal  deferred Extremities  No bilateral cyanosis, clubbing or edema.   30 minutes of direct face to face counseling time was spent with the patient. This included discussion about prognosis, therapy recommendations and postoperative side effects and are beyond the scope of routine postoperative care.   Thereasa Solo, MD  02/11/2020, 2:07 PM            Electronically signed  by Everitt Amber, MD at 02/11/2020 2:15 PM CT Chest W Contrast (Accession [id number]) (Order [id number]) Imaging Date: 03/02/2020 Department:  [facility] Released By: Rudi Heap Authorizing: Everitt Amber, MD  Exam Status  Status  Final [99]  PACS Intelerad Image Link  Show images for CT Chest W Contrast Study Result  Narrative & Impression  CLINICAL DATA:  Stage III endometrial carcinosarcoma.  EXAM: CT CHEST WITH CONTRAST  TECHNIQUE: Multidetector CT imaging of the chest was performed during intravenous contrast administration.  CONTRAST:  58m OMNIPAQUE IOHEXOL 300 MG/ML  SOLN  COMPARISON:  CT angio chest 07/28/2016  FINDINGS: Cardiovascular: Normal heart size. Aortic atherosclerosis. Lad, left circumflex and RCA coronary artery calcifications.  Mediastinum/Nodes: No enlarged mediastinal, hilar, or axillary lymph nodes. Thyroid gland, trachea, and esophagus demonstrate no significant findings.  Lungs/Pleura: No pleural effusion. No airspace consolidation, atelectasis or pneumothorax. Several pulmonary nodules are identified including:  Anteromedial left upper lobe nodule measures 1.0 cm, image 49/4. New from previous exam.  Right upper lobe lung nodule measures 0.7 cm, image 38/4. Also new from previous exam.  Posterior left upper lobe lung nodule measures 2 mm, image 65/4. New from previous exam.  Upper Abdomen: No acute abnormality identified within the imaged portions of the upper abdomen. Left adrenal lesion is again noted measuring 3.1 x 2.6 cm, unchanged, image 145/2. Also unchanged is a right adrenal nodule measuring 1.1 x 0.8 cm, image 147/2.  Musculoskeletal: No chest wall abnormality. No acute or significant osseous findings.  IMPRESSION: 1. There are several pulmonary nodules identified within both lungs measuring up to 1.0 cm. New from 07/28/2016 suspicious for metastatic disease. 2. Multi vessel coronary artery calcifications noted. 3. Stable bilateral adrenal nodules. 4. Aortic atherosclerosis.  Aortic Atherosclerosis  (ICD10-I70.0).   Electronically Signed   By: TKerby MoorsM.D.   On: 03/02/2020 10:28     weight changes, if any: no Bowel/Bladder complaints, if any: no Nausea/Vomiting, if any: no  Pain issues, if any:  Abdominal pain per patient "4" today.  SAFETY ISSUES:  Prior radiation? no  Pacemaker/ICD?no  Possible current pregnancy? Hysterectomy 01/20/2020  Is the patient on methotrexate? no  Current Complaints / other details:  Referred to social work due to anxiety over dx.  BP (!) 152/63 (BP Location: Right Arm, Patient Position: Sitting, Cuff Size: Normal)   Pulse 69   Temp 98.1 F (36.7 C)   Resp 20   Ht 5' 3"  (1.6 m)   Wt 140 lb (63.5 kg)   SpO2 100%   BMI 24.80 kg/m   Wt Readings from Last 3 Encounters:  03/04/20 140 lb (63.5 kg)  03/02/20 137 lb (62.1 kg)  03/01/20 136 lb (61.7 kg)

## 2020-03-03 NOTE — Telephone Encounter (Signed)
24 hour follow up- spoke with patient and she is doing fairly well, states she is tired today but otherwise no problems. She is coming in tomorrow for her injection.

## 2020-03-03 NOTE — Telephone Encounter (Signed)
Called patient and left message asking her to please call the office.  Patient returned call and discussed CT chest findings with her.   "Not the results I was hoping for." She states she had her first cycle of chemo yesterday and is feeling good today.  Advised to call for any needs or concerns and to follow up as scheduled with Dr. Sondra Come and Dr. Raliegh Ip at [facility].

## 2020-03-03 NOTE — Progress Notes (Signed)
Radiation Oncology         ([phone]) [phone] ________________________________  Initial Outpatient Consultation  Name: Lisa Jenkins MRN: [id number]  Date: 03/04/2020  DOB: 21-Jan-1951  CC:Denyce Robert, FNP  Everitt Amber, MD   REFERRING PHYSICIAN: Everitt Amber, MD  DIAGNOSIS: The primary encounter diagnosis was Uterine sarcoma [facility]). A diagnosis of Endometrial cancer [facility]) was also pertinent to this visit.  Stage IIIA (pT3, pNx) high-grade carcinosarcoma of the uterus  HISTORY OF PRESENT ILLNESS::Lisa Jenkins is a 69 y.o. female who is seen as a courtesy of Dr. Denman George for an opinion concerning radiation therapy as part of management for her recently diagnosed uterine cancer. Today, she is accompanied by no one. She presented to the ED on 01/07/2020 with chief complaint of abdominal pain. CT of abdomen and pelvis at that time showed interval development of a large heterogeneous cystic and solid pelvic mass, uncertain if origin was uterus or ovaries. The mass was indeterminate for gynecologic malignancy given the new finding in a post-menopausal patient since previous exam in 2017. It also showed a grossly stable 2.9 cm left adrenal mass/adenoma, an indeterminate 11 mm right adrenal gland nodule, small bilateral renal cysts, and indeterminate hypodense lesions in the lower poles of both kidnies. While in the ED, the patient was also found to be hyperkalemic and was admitted to the hospital for observation. She was stabilized and discharged the following evening.  Regarding the referenced CT scan above, the patient underwent a CTA of chest/abdomen/pelvis for severe left-sided chest pain, back pain, and left-sided abdominal pain on 07/28/2016. Results showed left adrenal gland enlargement suggesting adrenal adenoma given the pre-contrast attenuation. It also showed advanced calcific atherosclerotic disease of the coronary arteries, calcified and non-calcified atherosclerotic disease of the aorta without  evidence of dissection or aneurysmal dilation, and calcified and non-calcified plaque at the origins of all the main aortic attributes.  She was seen in the ED again on 01/10/2020 for abdominal pain. She was found to be hyperkalemic in addition to have an acute kidney injury, so she was once again admitted to the hospital. While hospitalized, she was seen by Dr. Glo Herring, OB-GYN, who believed that she would likely be a surgical candidate and would refer her to Dr. Denman George for further management. The patient was stabilized and discharged from the hospital on 01/12/2020.  The patient was seen in consultation with Dr. Denman George on 01/14/2020. They discussed surgical removal of the mass given the possibility of malignancy and given the pain associated with the mass. Dr. Denman George recommended a robotic-assisted total hysterectomy with BSO, mini laparotomy for specimen delivery, possible laparotomy, and possible staging pending frozen section pathology results.  The patient underwent a robotic-assisted laparoscopic total hysterectomy with bilateral salpingo-oophorectomy, and minilaparotomy for specimen delivery on 01/20/2020 performed by Dr. Denman George. Pathology from the procedure revealed carcinosarcoma (malignant mixed Mullerian tumor) spanning 15.0 cm. The tumor involved the serosa. The surgical resection margins were negative for tumor and the ovaries and fallopian tubes were benign. MMR was normal.  The patient was last seen by Dr. Denman George on 02/11/2020. At that time, she recommended that the patient proceed with six cycles of adjuvant chemotherapy with Carboplatin and Paclitaxel. Given that the patient has a high risk of vaginal recurrence, she also recommended vaginal brachytherapy intercalated within her chemotherapy treatments.  The patient was referred to Dr. Delton Coombes, medical oncologist, and was seen in consultation on 02/16/2020. They discussed the chemotherapy regimen and side effects in detail. The patient  underwent port placement  on 03/01/2020 and began cycle 1 of chemotherapy with Carboplatin and Paclitaxel on 03/02/2020.  CT of chest on 03/02/2020 showed several pulmonary nodules identified within both lungs that measured up to 1.0 cm, which were new from 07/28/2016 and were suspicious for metastatic disease. Multi-vessel coronary artery calcifications were also noted. Finally, there were stable bilateral adrenal nodules and aortic atherosclerosis.  PREVIOUS RADIATION THERAPY: No  PAST MEDICAL HISTORY:  Past Medical History:  Diagnosis Date  . Arthritis   . Cancer ([place])   . Coronary artery disease    a. s/p NSTEMI in 07/2017 with DES to mid-RCA and residual disease along proximal-mid LAD  . Diabetes mellitus without complication ([place])   . High cholesterol   . Hyperkalemia   . Hypertension   . Myocardial infarction ([place])   . Vitamin D deficiency     PAST SURGICAL HISTORY: Past Surgical History:  Procedure Laterality Date  . ABDOMINAL HYSTERECTOMY    . Bilateral oopherectomy    . BUNIONECTOMY Bilateral   . CORONARY STENT INTERVENTION N/A 07/09/2017   Procedure: CORONARY STENT INTERVENTION;  Surgeon: Belva Crome, MD;  Location: [place] [facility];  Service: Cardiovascular;  Laterality: N/A;  . LEFT HEART CATH AND CORONARY ANGIOGRAPHY N/A 07/09/2017   Procedure: LEFT HEART CATH AND CORONARY ANGIOGRAPHY;  Surgeon: Belva Crome, MD;  Location: [place] [facility];  Service: Cardiovascular;  Laterality: N/A;  . PORTACATH PLACEMENT Left 03/01/2020   Procedure: INSERTION PORT-A-CATH;  Surgeon: Aviva Signs, MD;  Location: [facility];  Service: General;  Laterality: Left;  . ROBOTIC ASSISTED TOTAL HYSTERECTOMY WITH BILATERAL SALPINGO OOPHERECTOMY Bilateral 01/20/2020   Procedure: XI ROBOTIC ASSISTED TOTAL HYSTERECTOMY, UTERUS GREATER THAN 250 GRAMS WITH BILATERAL SALPINGO OOPHORECTOMY, MINI LAPAROTOMY;  Surgeon: Everitt Amber, MD;  Location: [facility];  Service: Gynecology;  Laterality: Bilateral;      FAMILY HISTORY:  Family History  Problem Relation Age of Onset  . Diabetes Mother   . Heart disease Mother   . Diabetes Sister   . Heart disease Sister   . Diabetes Brother   . Heart disease Brother   . Diabetes Brother   . Heart disease Brother   . Diabetes Brother   . Heart disease Brother   . Diabetes Brother   . Heart disease Brother   . Diabetes Brother   . Heart disease Brother   . Breast cancer Neg Hx   . Ovarian cancer Neg Hx   . Endometrial cancer Neg Hx   . Colon cancer Neg Hx     SOCIAL HISTORY:  Social History   Tobacco Use  . Smoking status: Current Every Day Smoker    Packs/day: 0.50    Years: 46.00    Pack years: 23.00    Types: Cigarettes  . Smokeless tobacco: Never Used  Vaping Use  . Vaping Use: Never used  Substance Use Topics  . Alcohol use: Not Currently    Alcohol/week: 0.0 standard drinks  . Drug use: No    ALLERGIES: No Known Allergies  MEDICATIONS:  Current Outpatient Medications  Medication Sig Dispense Refill  . acetaminophen (TYLENOL) 325 MG tablet Take 2 tablets (650 mg total) by mouth every 6 (six) hours. (Patient not taking: Reported on 03/02/2020) 20 tablet 0  . allopurinol (ZYLOPRIM) 100 MG tablet Take 1 tablet (100 mg total) by mouth daily. 30 tablet 0  . aspirin 81 MG chewable tablet Chew 81 mg by mouth daily.     Marland Kitchen atorvastatin (LIPITOR) 80 MG tablet  TAKE 1 Tablet BY MOUTH ONCE DAILY AT 6:00PM (Patient taking differently: Take 80 mg by mouth daily at 6 PM. TAKE 1 Tablet BY MOUTH ONCE DAILY AT 6:00PM) 90 tablet 1  . CARBOPLATIN IV Inject into the vein every 21 ( twenty-one) days.    . chlorthalidone (HYGROTON) 25 MG tablet Take 1 tablet by mouth once daily 90 tablet 0  . insulin degludec (TRESIBA FLEXTOUCH) 100 UNIT/ML FlexTouch Pen Inject 20 Units into the skin daily.    Marland Kitchen lidocaine-prilocaine (EMLA) cream Apply small amount to port a cath site (do not rub in) and cover with plastic wrap 1 hour prior to chemotherapy  appointments (Patient not taking: Reported on 03/02/2020) 30 g 0  . metFORMIN (GLUCOPHAGE) 1000 MG tablet TAKE 1 Tablet  BY MOUTH TWICE DAILY WITH MEALS (Patient taking differently: Take 1,000 mg by mouth 2 (two) times daily with a meal. ) 60 tablet 0  . metoprolol tartrate (LOPRESSOR) 25 MG tablet TAKE 1 Tablet  BY MOUTH TWICE DAILY (Patient taking differently: Take 25 mg by mouth 2 (two) times daily. ) 60 tablet 0  . OZEMPIC, 1 MG/DOSE, 2 MG/1.5ML SOPN Inject 1 mg into the skin every Thursday.    Marland Kitchen PACLITAXEL IV Inject into the vein every 21 ( twenty-one) days.    . prochlorperazine (COMPAZINE) 10 MG tablet Take 1 tablet (10 mg total) by mouth every 6 (six) hours as needed (Nausea or vomiting). (Patient not taking: Reported on 03/02/2020) 30 tablet 1  . senna-docusate (SENOKOT-S) 8.6-50 MG tablet Take 2 tablets by mouth at bedtime. Take to prevent constipation, do not take if having diarrhea (Patient taking differently: Take 2 tablets by mouth at bedtime as needed (constipation.). do not take if having diarrhea) 30 tablet 0  . SYNTHROID 25 MCG tablet TAKE 1 Tablet BY MOUTH ONCE DAILY BEFORE BREAKFAST (Patient taking differently: Take 25 mcg by mouth daily before breakfast. ) 90 tablet 1  . traMADol (ULTRAM) 50 MG tablet Take 1 tablet (50 mg total) by mouth every 6 (six) hours as needed for severe pain. Do not take and drive (Patient not taking: Reported on 03/02/2020) 25 tablet 0  . Vitamin D, Ergocalciferol, (DRISDOL) 1.25 MG (50000 UNIT) CAPS capsule Take 50,000 Units by mouth every Tuesday.      No current facility-administered medications for this encounter.    REVIEW OF SYSTEMS:  A 10+ POINT REVIEW OF SYSTEMS WAS OBTAINED including neurology, dermatology, psychiatry, cardiac, respiratory, lymph, extremities, GI, GU, musculoskeletal, constitutional, reproductive, HEENT.  She reports no pelvic pain abdominal bloating or vaginal bleeding   PHYSICAL EXAM:  height is 5' 3"  (1.6 m) and weight is 140 lb  (63.5 kg). Her temperature is 98.1 F (36.7 C). Her blood pressure is 152/63 (abnormal) and her pulse is 69. Her respiration is 20 and oxygen saturation is 100%.   General: Alert and oriented, in no acute distress HEENT: Head is normocephalic. Extraocular movements are intact.  Neck: Neck is supple, no palpable cervical or supraclavicular lymphadenopathy. Heart: Regular in rate and rhythm with no murmurs, rubs, or gallops. Chest: Clear to auscultation bilaterally, with no rhonchi, wheezes, or rales. Abdomen: Soft, nontender, nondistended, with no rigidity or guarding.  Laparotomy scars  well-healed.  sHe also has a small scar in the suprapubic area from her mini lap procedure Extremities: No cyanosis or edema. Lymphatics: see Neck Exam Skin: No concerning lesions. Musculoskeletal: symmetric strength and muscle tone throughout. Neurologic: Cranial nerves II through XII are grossly intact. No obvious  focalities. Speech is fluent. Coordination is intact. Psychiatric: Judgment and insight are intact. Affect is appropriate. Pelvic exam deferred in light of recent surgery  ECOG = 1  0 - Asymptomatic (Fully active, able to carry on all predisease activities without restriction)  1 - Symptomatic but completely ambulatory (Restricted in physically strenuous activity but ambulatory and able to carry out work of a light or sedentary nature. For example, light housework, office work)  2 - Symptomatic, <50% in bed during the day (Ambulatory and capable of all self care but unable to carry out any work activities. Up and about more than 50% of waking hours)  3 - Symptomatic, >50% in bed, but not bedbound (Capable of only limited self-care, confined to bed or chair 50% or more of waking hours)  4 - Bedbound (Completely disabled. Cannot carry on any self-care. Totally confined to bed or chair)  5 - Death   Eustace Pen MM, Creech RH, Tormey DC, et al. [phone]). "Toxicity and response criteria of the [facility]". [place] Oncol. 5 (6): 649-55  LABORATORY DATA:  Lab Results  Component Value Date   WBC 10.4 03/02/2020   HGB 10.2 (L) 03/02/2020   HCT 32.8 (L) 03/02/2020   MCV 96.2 03/02/2020   PLT 407 (H) 03/02/2020   NEUTROABS 6.4 03/02/2020   Lab Results  Component Value Date   NA 140 03/02/2020   K 4.4 03/02/2020   CL 107 03/02/2020   CO2 23 03/02/2020   GLUCOSE 91 03/02/2020   CREATININE 0.75 03/02/2020   CALCIUM 8.4 (L) 03/02/2020      RADIOGRAPHY: CT Chest W Contrast  Result Date: 03/02/2020 CLINICAL DATA:  Stage III endometrial carcinosarcoma. EXAM: CT CHEST WITH CONTRAST TECHNIQUE: Multidetector CT imaging of the chest was performed during intravenous contrast administration. CONTRAST:  90m OMNIPAQUE IOHEXOL 300 MG/ML  SOLN COMPARISON:  CT angio chest 07/28/2016 FINDINGS: Cardiovascular: Normal heart size. Aortic atherosclerosis. Lad, left circumflex and RCA coronary artery calcifications. Mediastinum/Nodes: No enlarged mediastinal, hilar, or axillary lymph nodes. Thyroid gland, trachea, and esophagus demonstrate no significant findings. Lungs/Pleura: No pleural effusion. No airspace consolidation, atelectasis or pneumothorax. Several pulmonary nodules are identified including: Anteromedial left upper lobe nodule measures 1.0 cm, image 49/4. New from previous exam. Right upper lobe lung nodule measures 0.7 cm, image 38/4. Also new from previous exam. Posterior left upper lobe lung nodule measures 2 mm, image 65/4. New from previous exam. Upper Abdomen: No acute abnormality identified within the imaged portions of the upper abdomen. Left adrenal lesion is again noted measuring 3.1 x 2.6 cm, unchanged, image 145/2. Also unchanged is a right adrenal nodule measuring 1.1 x 0.8 cm, image 147/2. Musculoskeletal: No chest wall abnormality. No acute or significant osseous findings. IMPRESSION: 1. There are several pulmonary nodules identified within both lungs  measuring up to 1.0 cm. New from 07/28/2016 suspicious for metastatic disease. 2. Multi vessel coronary artery calcifications noted. 3. Stable bilateral adrenal nodules. 4. Aortic atherosclerosis. Aortic Atherosclerosis (ICD10-I70.0). Electronically Signed   By: TKerby MoorsM.D.   On: 03/02/2020 10:28   DG Chest Port 1 View  Result Date: 03/01/2020 CLINICAL DATA:  Status post Port-A-Cath placement. EXAM: PORTABLE CHEST 1 VIEW COMPARISON:  July 08, 2017. FINDINGS: The heart size and mediastinal contours are within normal limits. Both lungs are clear. Left subclavian Port-A-Cath is noted with distal tip in expected position of cavoatrial junction. No pneumothorax or pleural effusion is noted. The visualized skeletal structures are unremarkable. IMPRESSION: Left  subclavian Port-A-Cath is noted with distal tip in expected position of cavoatrial junction. Electronically Signed   By: Marijo Conception M.D.   On: 03/01/2020 14:57   DG C-Arm 1-60 Min-No Report  Result Date: 03/01/2020 Fluoroscopy was utilized by the requesting physician.  No radiographic interpretation.      IMPRESSION: Stage IIIA (pT3, pNx) high-grade carcinosarcoma of the uterus.  Additional staging work-up earlier this week with chest CT scans showed unfortunately new pulmonary nodules suspicious for metastatic disease.  Given these findings she would be classified as stage IV and the benefit of vaginal brachytherapy would be marginal in this situation.  If patient develops vaginal bleeding from a vaginal cuff recurrence then she would be a candidate for palliative radiation therapy .  I discussed the chest CT scan findings with the patient and she appears to understand.  Thus far she appears to be tolerating chemotherapy well.  PLAN: As needed follow-up in radiation oncology.  Patient will continue on chemotherapy as planned.     ------------------------------------------------  Blair Promise, PhD, MD  This document serves  as a record of services personally performed by Gery Pray, MD. It was created on his behalf by Clerance Lav, a trained medical scribe. The creation of this record is based on the scribe's personal observations and the provider's statements to them. This document has been checked and approved by the attending provider.

## 2020-03-03 NOTE — Telephone Encounter (Signed)
Duplicate phone call

## 2020-03-04 ENCOUNTER — Other Ambulatory Visit: Payer: Self-pay

## 2020-03-04 ENCOUNTER — Ambulatory Visit
Admission: RE | Admit: 2020-03-04 | Discharge: 2020-03-04 | Disposition: A | Discharge status: Home / Self Care | Payer: Medicare HMO | Source: Ambulatory Visit | Attending: Radiation Oncology | Admitting: Radiation Oncology

## 2020-03-04 ENCOUNTER — Inpatient Hospital Stay (HOSPITAL_COMMUNITY): Payer: Medicare HMO | Attending: Hematology

## 2020-03-04 ENCOUNTER — Encounter (HOSPITAL_COMMUNITY): Payer: Self-pay

## 2020-03-04 ENCOUNTER — Encounter: Payer: Self-pay | Admitting: Radiation Oncology

## 2020-03-04 VITALS — BP 152/63 | HR 69 | Temp 98.1°F | Resp 20 | Ht 63.0 in | Wt 140.0 lb

## 2020-03-04 VITALS — BP 145/84 | HR 74 | Temp 97.7°F | Resp 17

## 2020-03-04 DIAGNOSIS — R918 Other nonspecific abnormal finding of lung field: Secondary | ICD-10-CM | POA: Diagnosis not present

## 2020-03-04 DIAGNOSIS — E279 Disorder of adrenal gland, unspecified: Secondary | ICD-10-CM | POA: Diagnosis not present

## 2020-03-04 DIAGNOSIS — Z794 Long term (current) use of insulin: Secondary | ICD-10-CM | POA: Insufficient documentation

## 2020-03-04 DIAGNOSIS — F17210 Nicotine dependence, cigarettes, uncomplicated: Secondary | ICD-10-CM | POA: Insufficient documentation

## 2020-03-04 DIAGNOSIS — E039 Hypothyroidism, unspecified: Secondary | ICD-10-CM | POA: Insufficient documentation

## 2020-03-04 DIAGNOSIS — Z79899 Other long term (current) drug therapy: Secondary | ICD-10-CM | POA: Insufficient documentation

## 2020-03-04 DIAGNOSIS — Z5111 Encounter for antineoplastic chemotherapy: Secondary | ICD-10-CM | POA: Diagnosis not present

## 2020-03-04 DIAGNOSIS — C549 Malignant neoplasm of corpus uteri, unspecified: Secondary | ICD-10-CM

## 2020-03-04 DIAGNOSIS — I252 Old myocardial infarction: Secondary | ICD-10-CM | POA: Insufficient documentation

## 2020-03-04 DIAGNOSIS — I10 Essential (primary) hypertension: Secondary | ICD-10-CM | POA: Insufficient documentation

## 2020-03-04 DIAGNOSIS — Z90722 Acquired absence of ovaries, bilateral: Secondary | ICD-10-CM | POA: Insufficient documentation

## 2020-03-04 DIAGNOSIS — E119 Type 2 diabetes mellitus without complications: Secondary | ICD-10-CM | POA: Diagnosis not present

## 2020-03-04 DIAGNOSIS — E7800 Pure hypercholesterolemia, unspecified: Secondary | ICD-10-CM | POA: Insufficient documentation

## 2020-03-04 DIAGNOSIS — R109 Unspecified abdominal pain: Secondary | ICD-10-CM | POA: Diagnosis not present

## 2020-03-04 DIAGNOSIS — Z833 Family history of diabetes mellitus: Secondary | ICD-10-CM | POA: Insufficient documentation

## 2020-03-04 DIAGNOSIS — C541 Malignant neoplasm of endometrium: Secondary | ICD-10-CM | POA: Diagnosis present

## 2020-03-04 DIAGNOSIS — Z5189 Encounter for other specified aftercare: Secondary | ICD-10-CM | POA: Insufficient documentation

## 2020-03-04 DIAGNOSIS — I2510 Atherosclerotic heart disease of native coronary artery without angina pectoris: Secondary | ICD-10-CM | POA: Insufficient documentation

## 2020-03-04 DIAGNOSIS — Z7982 Long term (current) use of aspirin: Secondary | ICD-10-CM | POA: Insufficient documentation

## 2020-03-04 DIAGNOSIS — Z9079 Acquired absence of other genital organ(s): Secondary | ICD-10-CM | POA: Insufficient documentation

## 2020-03-04 DIAGNOSIS — Z9221 Personal history of antineoplastic chemotherapy: Secondary | ICD-10-CM | POA: Insufficient documentation

## 2020-03-04 DIAGNOSIS — Z90710 Acquired absence of both cervix and uterus: Secondary | ICD-10-CM | POA: Diagnosis not present

## 2020-03-04 DIAGNOSIS — M129 Arthropathy, unspecified: Secondary | ICD-10-CM | POA: Insufficient documentation

## 2020-03-04 DIAGNOSIS — I700 Atherosclerosis of aorta: Secondary | ICD-10-CM | POA: Diagnosis not present

## 2020-03-04 DIAGNOSIS — E875 Hyperkalemia: Secondary | ICD-10-CM | POA: Diagnosis not present

## 2020-03-04 DIAGNOSIS — Z8249 Family history of ischemic heart disease and other diseases of the circulatory system: Secondary | ICD-10-CM | POA: Insufficient documentation

## 2020-03-04 MED ORDER — PEGFILGRASTIM-JMDB 6 MG/0.6ML ~~LOC~~ SOSY
6.0000 mg | PREFILLED_SYRINGE | Freq: Once | SUBCUTANEOUS | Status: AC
  Administered 2020-03-04: 6 mg via SUBCUTANEOUS
  Filled 2020-03-04: qty 0.6

## 2020-03-04 NOTE — Progress Notes (Signed)
Patient here today for Fulphila injection. Pt was given injection in her left arm. Pt tolerated injection well with no complaints. Pt discharged home ambulatory.  Pt to return as scheduled for treatments and injections.

## 2020-03-05 ENCOUNTER — Encounter: Payer: Self-pay | Admitting: Licensed Clinical Social Worker

## 2020-03-05 NOTE — Progress Notes (Signed)
[place] Psychosocial Distress Screening Clinical Social Work  Clinical Social Work was referred by distress screening protocol.  The patient scored a 9 on the Psychosocial Distress Thermometer which indicates severe distress. Clinical Social Worker attempted to contact patient by phone to assess for distress and other psychosocial needs.   No answer, left VM requesting return call and providing direct contact information.  ONCBCN DISTRESS SCREENING 03/04/2020  Distress experienced in past week (1-10) 9  Referral to clinical social work Yes  Other can call patient at home       Jannet Calip, [place], LCSW

## 2020-03-05 NOTE — Progress Notes (Signed)
[place] Initial Psychosocial Assessment Clinical Social Work  Clinical Social Work contacted by phone to assess psychosocial, emotional, mental health, and spiritual needs of the patient.   Barriers to care/review of distress screen:  - Transportation:  Usually her friend Neysa Hotter) helps drive to appointment. Also connected with [facility] transportation for when friend is not available.  - Help at home:  Lives with sister-in-law who helps at home.  Friend also offers to help although patient really wants to continue doing things on her own as much as possible. - Support system: Has a few close friends who are supports for her. Not able to see people as much recently due to chemo "wiping her out" and still having abdominal pain.  - Finances:  No concerns currently. Receives [employer] retirement income. States "everything is paid off" and she "has good insurance which helps"  What is your understanding of where you are with your cancer? Its cause?  Your treatment plan and what happens next? Currently doing chemo and knows that the doctors are wanting to see how she responds. She does not need radiation at this time per her appointment yesterday, but she is willing to do any treatment she needs in the future   What are your worries for the future as you begin treatment for cancer? Wants to be able to maintain independence as much as possible and worried that she is getting really tired from chemo which is making it hard   What are your hopes and priorities during your treatment? What is important to you? What are your goals for your care? As noted above, hopes to maintain independence as much as possible with seeing friends and being able to do tasks around the house   CSW Summary:  Patient and family psychosocial functioning including strengths, limitations, and coping skills: Currently able to do daily tasks and maintain connections with friends. Uses prayer and faith to help cope with  diagnosis  Identifications of barriers to care: none at this time as transportation has been addressed   Clinical Social Worker follow up needed: No. CSW informed patient of available support services and groups. Patient declined at this time and wants to "see how it goes" with treatment before taking part in groups or speaking with CSW or chaplain about coping and adjustment   Edwinna Areola Elga Santy, LCSW

## 2020-03-10 ENCOUNTER — Inpatient Hospital Stay (HOSPITAL_COMMUNITY): Payer: Medicare HMO

## 2020-03-10 ENCOUNTER — Other Ambulatory Visit: Payer: Self-pay

## 2020-03-10 ENCOUNTER — Inpatient Hospital Stay (HOSPITAL_BASED_OUTPATIENT_CLINIC_OR_DEPARTMENT_OTHER): Payer: Medicare HMO | Admitting: Hematology

## 2020-03-10 ENCOUNTER — Ambulatory Visit (HOSPITAL_COMMUNITY): Payer: Medicare HMO | Admitting: Hematology

## 2020-03-10 DIAGNOSIS — Z5111 Encounter for antineoplastic chemotherapy: Secondary | ICD-10-CM | POA: Diagnosis not present

## 2020-03-10 DIAGNOSIS — C541 Malignant neoplasm of endometrium: Secondary | ICD-10-CM

## 2020-03-10 DIAGNOSIS — C549 Malignant neoplasm of corpus uteri, unspecified: Secondary | ICD-10-CM

## 2020-03-10 LAB — COMPREHENSIVE METABOLIC PANEL
ALT: 19 U/L (ref 0–44)
AST: 22 U/L (ref 15–41)
Albumin: 3.6 g/dL (ref 3.5–5.0)
Alkaline Phosphatase: 147 U/L — ABNORMAL HIGH (ref 38–126)
Anion gap: 9 (ref 5–15)
BUN: 24 mg/dL — ABNORMAL HIGH (ref 8–23)
CO2: 22 mmol/L (ref 22–32)
Calcium: 8.4 mg/dL — ABNORMAL LOW (ref 8.9–10.3)
Chloride: 106 mmol/L (ref 98–111)
Creatinine, Ser: 0.97 mg/dL (ref 0.44–1.00)
GFR calc Af Amer: >60 mL/min (ref >60)
GFR calc non Af Amer: >60 mL/min (ref >60)
Glucose, Bld: 66 mg/dL — ABNORMAL LOW (ref 70–99)
Potassium: 4.3 mmol/L (ref 3.5–5.1)
Sodium: 137 mmol/L (ref 135–145)
Total Bilirubin: 0.5 mg/dL (ref 0.3–1.2)
Total Protein: 6.4 g/dL — ABNORMAL LOW (ref 6.5–8.1)

## 2020-03-10 LAB — CBC WITH DIFFERENTIAL/PLATELET
Abs Immature Granulocytes: 0.29 10*3/uL — ABNORMAL HIGH (ref 0.00–0.07)
Basophils Absolute: 0.1 10*3/uL (ref 0.0–0.1)
Basophils Relative: 1 %
Eosinophils Absolute: 0.2 10*3/uL (ref 0.0–0.5)
Eosinophils Relative: 1 %
HCT: 31.7 % — ABNORMAL LOW (ref 36.0–46.0)
Hemoglobin: 9.8 g/dL — ABNORMAL LOW (ref 12.0–15.0)
Immature Granulocytes: 2 %
Lymphocytes Relative: 16 %
Lymphs Abs: 2.7 10*3/uL (ref 0.7–4.0)
MCH: 29.4 pg (ref 26.0–34.0)
MCHC: 30.9 g/dL (ref 30.0–36.0)
MCV: 95.2 fL (ref 80.0–100.0)
Monocytes Absolute: 2.3 10*3/uL — ABNORMAL HIGH (ref 0.1–1.0)
Monocytes Relative: 14 %
Neutro Abs: 11.3 10*3/uL — ABNORMAL HIGH (ref 1.7–7.7)
Neutrophils Relative %: 66 %
Platelets: 189 10*3/uL (ref 150–400)
RBC: 3.33 MIL/uL — ABNORMAL LOW (ref 3.87–5.11)
RDW: 15 % (ref 11.5–15.5)
WBC: 16.9 10*3/uL — ABNORMAL HIGH (ref 4.0–10.5)
nRBC: 0 % (ref 0.0–0.2)

## 2020-03-10 LAB — LACTATE DEHYDROGENASE: LDH: 192 U/L (ref 98–192)

## 2020-03-10 LAB — MAGNESIUM: Magnesium: 1.2 mg/dL — ABNORMAL LOW (ref 1.7–2.4)

## 2020-03-10 MED ORDER — SODIUM CHLORIDE 0.9% FLUSH
10.0000 mL | INTRAVENOUS | Status: DC | PRN
  Administered 2020-03-10: 10 mL via INTRAVENOUS

## 2020-03-10 MED ORDER — MAGNESIUM OXIDE 400 (241.3 MG) MG PO TABS
400.0000 mg | ORAL_TABLET | Freq: Two times a day (BID) | ORAL | 1 refills | Status: DC
Start: 2020-03-10 — End: 2020-05-03

## 2020-03-10 MED ORDER — MAGNESIUM SULFATE 2 GM/50ML IV SOLN
2.0000 g | Freq: Once | INTRAVENOUS | Status: AC
  Administered 2020-03-10: 2 g via INTRAVENOUS
  Filled 2020-03-10: qty 50

## 2020-03-10 MED ORDER — SODIUM CHLORIDE 0.9 % IV SOLN: INTRAVENOUS | Status: AC

## 2020-03-10 MED ORDER — HEPARIN SOD (PORK) LOCK FLUSH 100 UNIT/ML IV SOLN
500.0000 [IU] | Freq: Once | INTRAVENOUS | Status: AC
  Administered 2020-03-10: 500 [IU] via INTRAVENOUS

## 2020-03-10 NOTE — Progress Notes (Signed)
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[place])   03/02/2020 -  Chemotherapy   The patient had palonosetron (ALOXI) injection 0.25 mg, 0.25 mg, Intravenous,  Once, 1 of 6 cycles Administration: 0.25 mg (03/02/2020) pegfilgrastim-jmdb (FULPHILA) injection 6 mg, 6 mg, Subcutaneous,  Once, 1 of 6 cycles Administration: 6 mg (03/04/2020) CARBOplatin (PARAPLATIN) 480 mg in sodium chloride 0.9 % 250 mL chemo infusion, 480 mg (100 % of original dose 475.2 mg), Intravenous,  Once, 1 of 6 cycles Dose modification:   (original dose 475.2 mg, Cycle 1, Reason: Provider Judgment) Administration: 480 mg (03/02/2020) fosaprepitant (EMEND) 150 mg in sodium chloride 0.9 % 145 mL IVPB, 150 mg, Intravenous,  Once, 1 of 6 cycles Administration: 150 mg (03/02/2020) PACLitaxel (TAXOL) 288 mg in sodium chloride 0.9 % 250 mL chemo infusion (> 2m/m2), 175 mg/m2 = 288 mg, Intravenous,  Once, 1 of 6 cycles Administration: 288 mg (03/02/2020)  for chemotherapy treatment.    [place]  02/11/2020 Initial Diagnosis   Endometrial cancer ([place]   03/02/2020 -  Chemotherapy   The patient had palonosetron (ALOXI) injection 0.25 mg, 0.25 mg, Intravenous,  Once, 1 of 6 cycles Administration: 0.25 mg (03/02/2020) pegfilgrastim-jmdb (FULPHILA) injection 6 mg, 6 mg, Subcutaneous,  Once, 1 of 6 cycles Administration: 6 mg (03/04/2020) CARBOplatin (PARAPLATIN) 480 mg in sodium chloride 0.9 % 250 mL chemo infusion, 480 mg (100 % of original dose 475.2 mg), Intravenous,  Once, 1 of 6 cycles Dose modification:    (original dose 475.2 mg, Cycle 1, Reason: Provider Judgment) Administration: 480 mg (03/02/2020) fosaprepitant (EMEND) 150 mg in sodium chloride 0.9 % 145 mL IVPB, 150 mg, Intravenous,  Once, 1 of 6 cycles Administration: 150 mg (03/02/2020) PACLitaxel (TAXOL) 288 mg in sodium chloride 0.9 % 250 mL chemo infusion (> 869mm2), 175 mg/m2 = 288 mg, Intravenous,  Once, 1 of 6 cycles Administration: 288 mg (03/02/2020)  for chemotherapy treatment.      CANCER STAGING: Cancer Staging No matching staging information was found for the patient.  INTERVAL HISTORY:  Lisa Jenkins [age].o. female, returns for routine follow-up of her endometrial cancer. [name] last seen on 02/16/2020.  Today she reports feeling very fatigued, which is the same level before starting chemo. She tolerated the first chemo treatment well. She does report having cramping in her lower legs which woke her up in the morning. She denies numbness/tingling in her hands and feet and denies having mouth sores. She is not currently taking magnesium tablets. She has regular BM daily. Her taste is driving down her appetite. She drinks 1 can of Boost daily.  She denies being infected with COVID and is able to do her daily activities at home. She continues smoking and is considering cessation.    REVIEW OF SYSTEMS:  Review of Systems  Constitutional: Positive for appetite change (severely decreased) and fatigue (severe).  HENT:   Negative for mouth sores.   Respiratory: Positive for cough.   Neurological: Positive for dizziness and headaches. Negative for numbness.  Psychiatric/Behavioral: Positive for depression. The patient is nervous/anxious.  All other systems reviewed and are negative.   PAST MEDICAL/SURGICAL HISTORY:  Past Medical History:  Diagnosis Date   Arthritis    Cancer ([place])    Coronary artery disease    a. s/p NSTEMI in 07/2017 with DES to mid-RCA and residual disease along proximal-mid LAD   Diabetes  mellitus without complication ([place])    High cholesterol    Hyperkalemia    Hypertension    Myocardial infarction ([place])    Vitamin D deficiency    Past Surgical History:  Procedure Laterality Date   ABDOMINAL HYSTERECTOMY     Bilateral oopherectomy     BUNIONECTOMY Bilateral    CORONARY STENT INTERVENTION N/A 07/09/2017   Procedure: CORONARY STENT INTERVENTION;  Surgeon: Belva Crome, MD;  Location: [facility];  Service: Cardiovascular;  Laterality: N/A;   LEFT HEART CATH AND CORONARY ANGIOGRAPHY N/A 07/09/2017   Procedure: LEFT HEART CATH AND CORONARY ANGIOGRAPHY;  Surgeon: Belva Crome, MD;  Location: [facility];  Service: Cardiovascular;  Laterality: N/A;   PORTACATH PLACEMENT Left 03/01/2020   Procedure: INSERTION PORT-A-CATH;  Surgeon: Aviva Signs, MD;  Location: [facility];  Service: General;  Laterality: Left;   ROBOTIC ASSISTED TOTAL HYSTERECTOMY WITH BILATERAL SALPINGO OOPHERECTOMY Bilateral 01/20/2020   Procedure: XI ROBOTIC ASSISTED TOTAL HYSTERECTOMY, UTERUS GREATER THAN 250 GRAMS WITH BILATERAL SALPINGO OOPHORECTOMY, MINI LAPAROTOMY;  Surgeon: Everitt Amber, MD;  Location: [facility];  Service: Gynecology;  Laterality: Bilateral;    SOCIAL HISTORY:  Social History   Socioeconomic History   Marital status: Divorced    Spouse name: Not on file   Number of children: Not on file   Years of education: Not on file   Highest education level: Not on file  Occupational History   Occupation: retired  Tobacco Use   Smoking status: Current Every Day Smoker    Packs/day: 0.50    Years: 46.00    Pack years: 23.00    Types: Cigarettes   Smokeless tobacco: Never Used  Scientific laboratory technician Use: Never used  Substance and Sexual Activity   Alcohol use: Not Currently    Alcohol/week: 0.0 standard drinks   Drug use: No   Sexual activity: Not Currently  Other Topics Concern   Not on file  Social History Narrative   Not on file   Social  Determinants of Health   Financial Resource Strain: Low Risk    Difficulty of Paying Living Expenses: Not hard at all  Food Insecurity: No Food Insecurity   Worried About Charity fundraiser in the Last Year: Never true   [place] in the Last Year: Never true  Transportation Needs: No Transportation Needs   Lack of Transportation (Medical): No   Lack of Transportation (Non-Medical): No  Physical Activity: Insufficiently Active   Days of Exercise per Week: 2 days   Minutes of Exercise per Session: 10 min  Stress: No Stress Concern Present   Feeling of Stress : Only a little  Social Connections: Moderately Integrated   Frequency of Communication with Friends and Family: Twice a week   Frequency of Social Gatherings with Friends and Family: Twice a week   Attends Religious Services: More than 4 times per year   Active Member of [employer] or Organizations: No   Attends Music therapist: 1 to 4 times per year   Marital Status: Divorced  Human resources officer Violence: Not At Risk   Fear of Current or Ex-Partner: No  Emotionally Abused: No   Physically Abused: No   Sexually Abused: No    FAMILY HISTORY:  Family History  Problem Relation Age of Onset   Diabetes Mother    Heart disease Mother    Diabetes Sister    Heart disease Sister    Diabetes Brother    Heart disease Brother    Diabetes Brother    Heart disease Brother    Diabetes Brother    Heart disease Brother    Diabetes Brother    Heart disease Brother    Diabetes Brother    Heart disease Brother    Breast cancer Neg Hx    Ovarian cancer Neg Hx    Endometrial cancer Neg Hx    Colon cancer Neg Hx     CURRENT MEDICATIONS:  Current Outpatient Medications  Medication Sig Dispense Refill   acetaminophen (TYLENOL) 325 MG tablet Take 2 tablets (650 mg total) by mouth every 6 (six) hours. (Patient not taking: Reported on 03/02/2020) 20 tablet 0   allopurinol (ZYLOPRIM)  100 MG tablet Take 1 tablet (100 mg total) by mouth daily. 30 tablet 0   aspirin 81 MG chewable tablet Chew 81 mg by mouth daily.      atorvastatin (LIPITOR) 80 MG tablet TAKE 1 Tablet BY MOUTH ONCE DAILY AT 6:00PM (Patient taking differently: Take 80 mg by mouth daily at 6 PM. TAKE 1 Tablet BY MOUTH ONCE DAILY AT 6:00PM) 90 tablet 1   CARBOPLATIN IV Inject into the vein every 21 ( twenty-one) days.     chlorthalidone (HYGROTON) 25 MG tablet Take 1 tablet by mouth once daily 90 tablet 0   insulin degludec (TRESIBA FLEXTOUCH) 100 UNIT/ML FlexTouch Pen Inject 20 Units into the skin daily.     lidocaine-prilocaine (EMLA) cream Apply small amount to port a cath site (do not rub in) and cover with plastic wrap 1 hour prior to chemotherapy appointments (Patient not taking: Reported on 03/02/2020) 30 g 0   metFORMIN (GLUCOPHAGE) 1000 MG tablet TAKE 1 Tablet  BY MOUTH TWICE DAILY WITH MEALS (Patient taking differently: Take 1,000 mg by mouth 2 (two) times daily with a meal. ) 60 tablet 0   metoprolol tartrate (LOPRESSOR) 25 MG tablet TAKE 1 Tablet  BY MOUTH TWICE DAILY (Patient taking differently: Take 25 mg by mouth 2 (two) times daily. ) 60 tablet 0   OZEMPIC, 1 MG/DOSE, 2 MG/1.5ML SOPN Inject 1 mg into the skin every Thursday.     PACLITAXEL IV Inject into the vein every 21 ( twenty-one) days.     prochlorperazine (COMPAZINE) 10 MG tablet Take 1 tablet (10 mg total) by mouth every 6 (six) hours as needed (Nausea or vomiting). (Patient not taking: Reported on 03/02/2020) 30 tablet 1   senna-docusate (SENOKOT-S) 8.6-50 MG tablet Take 2 tablets by mouth at bedtime. Take to prevent constipation, do not take if having diarrhea (Patient taking differently: Take 2 tablets by mouth at bedtime as needed (constipation.). do not take if having diarrhea) 30 tablet 0   SYNTHROID 25 MCG tablet TAKE 1 Tablet BY MOUTH ONCE DAILY BEFORE BREAKFAST (Patient taking differently: Take 25 mcg by mouth daily before  breakfast. ) 90 tablet 1   traMADol (ULTRAM) 50 MG tablet Take 1 tablet (50 mg total) by mouth every 6 (six) hours as needed for severe pain. Do not take and drive (Patient not taking: Reported on 03/02/2020) 25 tablet 0   Vitamin D, Ergocalciferol, (DRISDOL) 1.25 MG (50000 UNIT) CAPS capsule Take  50,000 Units by mouth every Tuesday.      No current facility-administered medications for this visit.   Facility-Administered Medications Ordered in Other Visits  Medication Dose Route Frequency Provider Last Rate Last Admin   sodium chloride flush (NS) 0.9 % injection 10 mL  10 mL Intravenous PRN Derek Jack, MD   10 mL at 03/10/20 1020    ALLERGIES:  No Known Allergies  PHYSICAL EXAM:  Performance status (ECOG): 1 - Symptomatic but completely ambulatory  There were no vitals filed for this visit. Wt Readings from Last 3 Encounters:  03/04/20 63.5 kg (140 lb)  03/02/20 62.1 kg (137 lb)  03/01/20 61.7 kg (136 lb)   Physical Exam Vitals reviewed.  Constitutional:      Appearance: Normal appearance.  Cardiovascular:     Rate and Rhythm: Normal rate and regular rhythm.     Pulses: Normal pulses.     Heart sounds: Normal heart sounds.  Pulmonary:     Effort: Pulmonary effort is normal.     Breath sounds: Normal breath sounds.  Abdominal:     Palpations: Abdomen is soft. There is no mass.     Tenderness: There is no abdominal tenderness.  Lymphadenopathy:     Cervical: No cervical adenopathy.     Upper Body:     Right upper body: No supraclavicular adenopathy.     Left upper body: No supraclavicular adenopathy.  Neurological:     General: No focal deficit present.     Mental Status: She is alert and oriented to person, place, and time.  Psychiatric:        Mood and Affect: Mood normal.        Behavior: Behavior normal.      LABORATORY DATA:  I have reviewed the labs as listed.  CBC Latest Ref Rng & Units 03/10/2020 03/02/2020 02/27/2020  WBC 4.0 - 10.5 K/uL 16.9(H)  10.4 9.8  Hemoglobin 12.0 - 15.0 g/dL 9.8(L) 10.2(L) 10.3(L)  Hematocrit 36 - 46 % 31.7(L) 32.8(L) 33.2(L)  Platelets 150 - 400 K/uL 189 407(H) 355   CMP Latest Ref Rng & Units 03/10/2020 03/02/2020 02/11/2020  Glucose 70 - 99 mg/dL 66(L) 91 125(H)  BUN 8 - 23 mg/dL 24(H) 11 12  Creatinine 0.44 - 1.00 mg/dL 0.97 0.75 0.86  Sodium 135 - 145 mmol/L 137 140 142  Potassium 3.5 - 5.1 mmol/L 4.3 4.4 4.6  Chloride 98 - 111 mmol/L 106 107 106  CO2 22 - 32 mmol/L _0 Calcium 8.9 - 10.3 mg/dL 8.4(L) 8.4(L) 9.4  Total Protein 6.5 - 8.1 g/dL 6.4(L) 6.6 -  Total Bilirubin 0.3 - 1.2 mg/dL 0.5 0.5 -  Alkaline Phos 38 - 126 U/L 147(H) 109 -  AST 15 - 41 U/L 22 13(L) -  ALT 0 - 44 U/L 19 10 -   Lab Results  Component Value Date   LDH 192 03/10/2020    DIAGNOSTIC IMAGING:  I have independently reviewed the scans and discussed with the patient. CT Chest W Contrast  Result Date: 03/02/2020 CLINICAL DATA:  Stage III endometrial carcinosarcoma. EXAM: CT CHEST WITH CONTRAST TECHNIQUE: Multidetector CT imaging of the chest was performed during intravenous contrast administration. CONTRAST:  18m OMNIPAQUE IOHEXOL 300 MG/ML  SOLN COMPARISON:  CT angio chest 07/28/2016 FINDINGS: Cardiovascular: Normal heart size. Aortic atherosclerosis. Lad, left circumflex and RCA coronary artery calcifications. Mediastinum/Nodes: No enlarged mediastinal, hilar, or axillary lymph nodes. Thyroid gland, trachea, and esophagus demonstrate no significant findings. Lungs/Pleura: No pleural  effusion. No airspace consolidation, atelectasis or pneumothorax. Several pulmonary nodules are identified including: Anteromedial left upper lobe nodule measures 1.0 cm, image 49/4. New from previous exam. Right upper lobe lung nodule measures 0.7 cm, image 38/4. Also new from previous exam. Posterior left upper lobe lung nodule measures 2 mm, image 65/4. New from previous exam. Upper Abdomen: No acute abnormality identified within the imaged  portions of the upper abdomen. Left adrenal lesion is again noted measuring 3.1 x 2.6 cm, unchanged, image 145/2. Also unchanged is a right adrenal nodule measuring 1.1 x 0.8 cm, image 147/2. Musculoskeletal: No chest wall abnormality. No acute or significant osseous findings. IMPRESSION: 1. There are several pulmonary nodules identified within both lungs measuring up to 1.0 cm. New from 07/28/2016 suspicious for metastatic disease. 2. Multi vessel coronary artery calcifications noted. 3. Stable bilateral adrenal nodules. 4. Aortic atherosclerosis. Aortic Atherosclerosis (ICD10-I70.0). Electronically Signed   By: Kerby Moors M.D.   On: 03/02/2020 10:28   DG Chest Port 1 View  Result Date: 03/01/2020 CLINICAL DATA:  Status post Port-A-Cath placement. EXAM: PORTABLE CHEST 1 VIEW COMPARISON:  July 08, 2017. FINDINGS: The heart size and mediastinal contours are within normal limits. Both lungs are clear. Left subclavian Port-A-Cath is noted with distal tip in expected position of cavoatrial junction. No pneumothorax or pleural effusion is noted. The visualized skeletal structures are unremarkable. IMPRESSION: Left subclavian Port-A-Cath is noted with distal tip in expected position of cavoatrial junction. Electronically Signed   By: Marijo Conception M.D.   On: 03/01/2020 14:57   DG C-Arm 1-60 Min-No Report  Result Date: 03/01/2020 Fluoroscopy was utilized by the requesting physician.  No radiographic interpretation.     ASSESSMENT:  1.  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 -CTAP on 01/07/2020 shows large heterogeneous cystic and solid pelvic mass.  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  Indeterminate 11 mm right adrenal nodule.  Small bilateral renal cysts. -TAH and BSO on 01/20/2020 shows high-grade carcinosarcoma with tumor invading through myometrium and involves the serosa. -MMR was normal. -CT chest on 03/02/2020  showed several pulmonary nodules in both lungs measuring up to 1 cm, new from 07/28/2016 suspicious for metastatic disease. -Vaginal brachytherapy was put on hold based on CT scan of the chest findings showing metastatic disease. -Carboplatin and paclitaxel cycle 1 started on 03/02/2020.   PLAN:  1.  Stage III high-grade carcinosarcoma of the uterus: -She has tolerated first cycle reasonably well. -I reviewed her labs which showed normal LFTs and renal function.  White count is elevated secondary to growth factor. -She will come back in 2 weeks for follow-up and cycle 2.  2.  Diabetes: -Continue Metformin 1000 mg twice daily.  Continue Ozempic..  3.  Hypothyroidism: -Continue Synthroid.  4.  Hypertension: -Continue chlorthalidone and Lopressor.  5.  Hypomagnesemia: -Magnesium today is 1.2.  Complains of leg cramping. -We will replete magnesium today.  Start magnesium twice daily at home.  We will send prescription.   Orders placed this encounter:  No orders of the defined types were placed in this encounter.    Derek Jack, MD [place] [phone]   I, Milinda Antis, am acting as a scribe for Dr. Sanda Linger.  I, Derek Jack MD, have reviewed the above documentation for accuracy and completeness, and I agree with the above.

## 2020-03-10 NOTE — Patient Instructions (Signed)
[place] at [facility] Discharge Instructions  You were seen today by Dr. Delton Coombes. He went over your recent results. You will be prescribed magnesium tablets to take twice daily. Please drink 2-3 cans of Boost/Ensure daily. Continue pursuing smoking cessation. Dr. Delton Coombes will see you back in for labs and follow up.   Thank you for choosing [place] at [facility] to provide your oncology and hematology care.  To afford each patient quality time with our provider, please arrive at least 15 minutes before your scheduled appointment time.   If you have a lab appointment with the [facility] please come in thru the Main Entrance and check in at the main information desk  You need to re-schedule your appointment should you arrive 10 or more minutes late.  We strive to give you quality time with our providers, and arriving late affects you and other patients whose appointments are after yours.  Also, if you no show three or more times for appointments you may be dismissed from the clinic at the providers discretion.     Again, thank you for choosing [facility].  Our hope is that these requests will decrease the amount of time that you wait before being seen by our physicians.       _____________________________________________________________  Should you have questions after your visit to [facility], please contact our office at [phone] between the hours of 8:00 a.m. and 4:30 p.m.  Voicemails left after 4:00 p.m. will not be returned until the following business day.  For prescription refill requests, have your pharmacy contact our office and allow 72 hours.    Cancer Center Support Programs:   > Cancer Support Group  2nd Tuesday of the month 1pm-2pm, Journey Room

## 2020-03-10 NOTE — Progress Notes (Signed)
[place] reviewed with and pt seen by Dr. Delton Coombes and pt to receive 500 ml NS over 1 hour and Magnesium 2 grams IV today per MD                                                                                    Lisa Jenkins tolerated IV hydration and Magnesium infusion well without complaints or incident. VSS upon discharge. Pt discharged self ambulatory in satisfactory condition

## 2020-03-10 NOTE — Progress Notes (Signed)
Patient has been assessed, vital signs and labs have been reviewed by Dr. Delton Coombes. Please give 500 mL NS over 1 hour with 2 grams of magnesium IV per Dr. Delton Coombes.

## 2020-03-10 NOTE — Patient Instructions (Signed)
[place] at [facility] Discharge Instructions  Received IV hydration and Magnesium infusion. Follow-up as scheduled   Thank you for choosing [place] at [facility] to provide your oncology and hematology care.  To afford each patient quality time with our provider, please arrive at least 15 minutes before your scheduled appointment time.   If you have a lab appointment with the [facility] please come in thru the Main Entrance and check in at the main information desk.  You need to re-schedule your appointment should you arrive 10 or more minutes late.  We strive to give you quality time with our providers, and arriving late affects you and other patients whose appointments are after yours.  Also, if you no show three or more times for appointments you may be dismissed from the clinic at the providers discretion.     Again, thank you for choosing [facility].  Our hope is that these requests will decrease the amount of time that you wait before being seen by our physicians.       _____________________________________________________________  Should you have questions after your visit to [facility], please contact our office at [phone] between the hours of 8:00 a.m. and 4:30 p.m.  Voicemails left after 4:00 p.m. will not be returned until the following business day.  For prescription refill requests, have your pharmacy contact our office and allow 72 hours.    Due to Covid, you will need to wear a mask upon entering the hospital. If you do not have a mask, a mask will be given to you at the Main Entrance upon arrival. For doctor visits, patients may have 1 support person with them. For treatment visits, patients can not have anyone with them due to social distancing guidelines and our immunocompromised population.

## 2020-03-19 ENCOUNTER — Ambulatory Visit (HOSPITAL_COMMUNITY): Payer: Medicare HMO

## 2020-03-19 NOTE — Progress Notes (Signed)
Nutrition Assessment   Reason for Assessment:  Referral for taste change, poor appetite   ASSESSMENT:  69 year old female with stage III endometrial cancer.  Patient receiving carboplatin and paclitaxel.  Past medical history of CAD, DM, HTN, HLD, MI, Vit D deficency  Spoke with patient via phone.  Patient reports decrease appetite for the last 2 months following surgery (01/2020 total hysterectomy with bilateral salpingo oopherectomy).  Patient reports taste alterations.  No nausea, trouble chewing or swallowing.  Bowels are moving normally.  Patient reports she eats when she gets hungry.     Medications:  Tresiba, magox, metformin, ozempic, compazine, senokot   Labs: reviewed   Anthropometrics:   Height: 63 inches Weight: 140 lb 7/1 UBW: 136-140 lb BMI: 24    NUTRITION DIAGNOSIS: Inadequate oral intake related to cancer related treatment side effects (taste alterations) as evidenced by poor appetite   INTERVENTION:  Discussed strategies to help with taste change.  Will mail handout Contact information mailed   MONITORING, EVALUATION, GOAL: weight trends, intake   Next Visit: August 13, phone f/u  Evlyn Amason B. Zenia Resides, [place], [place] Registered Dietitian [phone] (pager)

## 2020-03-23 ENCOUNTER — Inpatient Hospital Stay (HOSPITAL_COMMUNITY): Payer: Medicare HMO

## 2020-03-23 ENCOUNTER — Inpatient Hospital Stay (HOSPITAL_BASED_OUTPATIENT_CLINIC_OR_DEPARTMENT_OTHER): Payer: Medicare HMO | Admitting: Hematology

## 2020-03-23 ENCOUNTER — Other Ambulatory Visit: Payer: Self-pay

## 2020-03-23 VITALS — BP 145/77 | HR 87 | Temp 98.7°F | Resp 17 | Wt 138.8 lb

## 2020-03-23 DIAGNOSIS — Z5111 Encounter for antineoplastic chemotherapy: Secondary | ICD-10-CM | POA: Diagnosis not present

## 2020-03-23 DIAGNOSIS — C541 Malignant neoplasm of endometrium: Secondary | ICD-10-CM

## 2020-03-23 DIAGNOSIS — C549 Malignant neoplasm of corpus uteri, unspecified: Secondary | ICD-10-CM

## 2020-03-23 LAB — CBC WITH DIFFERENTIAL/PLATELET
Abs Immature Granulocytes: 0.05 10*3/uL (ref 0.00–0.07)
Basophils Absolute: 0.1 10*3/uL (ref 0.0–0.1)
Basophils Relative: 1 %
Eosinophils Absolute: 0 10*3/uL (ref 0.0–0.5)
Eosinophils Relative: 0 %
HCT: 28.8 % — ABNORMAL LOW (ref 36.0–46.0)
Hemoglobin: 8.9 g/dL — ABNORMAL LOW (ref 12.0–15.0)
Immature Granulocytes: 1 %
Lymphocytes Relative: 27 %
Lymphs Abs: 2.3 10*3/uL (ref 0.7–4.0)
MCH: 29.9 pg (ref 26.0–34.0)
MCHC: 30.9 g/dL (ref 30.0–36.0)
MCV: 96.6 fL (ref 80.0–100.0)
Monocytes Absolute: 0.8 10*3/uL (ref 0.1–1.0)
Monocytes Relative: 9 %
Neutro Abs: 5.4 10*3/uL (ref 1.7–7.7)
Neutrophils Relative %: 62 %
Platelets: 378 10*3/uL (ref 150–400)
RBC: 2.98 MIL/uL — ABNORMAL LOW (ref 3.87–5.11)
RDW: 16.4 % — ABNORMAL HIGH (ref 11.5–15.5)
WBC: 8.6 10*3/uL (ref 4.0–10.5)
nRBC: 0 % (ref 0.0–0.2)

## 2020-03-23 LAB — COMPREHENSIVE METABOLIC PANEL
ALT: 18 U/L (ref 0–44)
AST: 16 U/L (ref 15–41)
Albumin: 3.4 g/dL — ABNORMAL LOW (ref 3.5–5.0)
Alkaline Phosphatase: 111 U/L (ref 38–126)
Anion gap: 9 (ref 5–15)
BUN: 12 mg/dL (ref 8–23)
CO2: 25 mmol/L (ref 22–32)
Calcium: 9.6 mg/dL (ref 8.9–10.3)
Chloride: 105 mmol/L (ref 98–111)
Creatinine, Ser: 0.72 mg/dL (ref 0.44–1.00)
GFR calc Af Amer: >60 mL/min (ref >60)
GFR calc non Af Amer: >60 mL/min (ref >60)
Glucose, Bld: 80 mg/dL (ref 70–99)
Potassium: 4.4 mmol/L (ref 3.5–5.1)
Sodium: 139 mmol/L (ref 135–145)
Total Bilirubin: 0.4 mg/dL (ref 0.3–1.2)
Total Protein: 6.4 g/dL — ABNORMAL LOW (ref 6.5–8.1)

## 2020-03-23 LAB — MAGNESIUM: Magnesium: 1.2 mg/dL — ABNORMAL LOW (ref 1.7–2.4)

## 2020-03-23 MED ORDER — SODIUM CHLORIDE 0.9 % IV SOLN
475.2000 mg | Freq: Once | INTRAVENOUS | Status: AC
  Administered 2020-03-23: 480 mg via INTRAVENOUS
  Filled 2020-03-23: qty 48

## 2020-03-23 MED ORDER — SODIUM CHLORIDE 0.9 % IV SOLN
150.0000 mg | Freq: Once | INTRAVENOUS | Status: AC
  Administered 2020-03-23: 150 mg via INTRAVENOUS
  Filled 2020-03-23: qty 150

## 2020-03-23 MED ORDER — DIPHENHYDRAMINE HCL 50 MG/ML IJ SOLN
50.0000 mg | Freq: Once | INTRAMUSCULAR | Status: AC
  Administered 2020-03-23: 50 mg via INTRAVENOUS
  Filled 2020-03-23: qty 1

## 2020-03-23 MED ORDER — SODIUM CHLORIDE 0.9% FLUSH
10.0000 mL | INTRAVENOUS | Status: DC | PRN
  Administered 2020-03-23: 10 mL via INTRAVENOUS

## 2020-03-23 MED ORDER — FAMOTIDINE IN NACL 20-0.9 MG/50ML-% IV SOLN
20.0000 mg | Freq: Once | INTRAVENOUS | Status: AC
  Administered 2020-03-23: 20 mg via INTRAVENOUS
  Filled 2020-03-23: qty 50

## 2020-03-23 MED ORDER — SODIUM CHLORIDE 0.9 % IV SOLN: Freq: Once | INTRAVENOUS | Status: AC

## 2020-03-23 MED ORDER — PALONOSETRON HCL INJECTION 0.25 MG/5ML
0.2500 mg | Freq: Once | INTRAVENOUS | Status: AC
  Administered 2020-03-23: 0.25 mg via INTRAVENOUS
  Filled 2020-03-23: qty 5

## 2020-03-23 MED ORDER — SODIUM CHLORIDE 0.9 % IV SOLN
175.0000 mg/m2 | Freq: Once | INTRAVENOUS | Status: AC
  Administered 2020-03-23: 288 mg via INTRAVENOUS
  Filled 2020-03-23: qty 48

## 2020-03-23 MED ORDER — SODIUM CHLORIDE 0.9% FLUSH
10.0000 mL | INTRAVENOUS | Status: DC | PRN
  Administered 2020-03-23: 10 mL

## 2020-03-23 MED ORDER — SODIUM CHLORIDE 0.9 % IV SOLN
10.0000 mg | Freq: Once | INTRAVENOUS | Status: AC
  Administered 2020-03-23: 10 mg via INTRAVENOUS
  Filled 2020-03-23: qty 10

## 2020-03-23 MED ORDER — SODIUM CHLORIDE 0.9 % IV SOLN
Freq: Once | INTRAVENOUS | Status: AC
  Filled 2020-03-23: qty 250

## 2020-03-23 MED ORDER — HEPARIN SOD (PORK) LOCK FLUSH 100 UNIT/ML IV SOLN
500.0000 [IU] | Freq: Once | INTRAVENOUS | Status: AC | PRN
  Administered 2020-03-23: 500 [IU]

## 2020-03-23 NOTE — Progress Notes (Signed)
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[place])  01/20/2020 Initial Diagnosis   Uterine sarcoma ([facility])   03/02/2020 -  Chemotherapy   The patient had palonosetron (ALOXI) injection 0.25 mg, 0.25 mg, Intravenous,  Once, 1 of 6 cycles Administration: 0.25 mg (03/02/2020) pegfilgrastim-jmdb (FULPHILA) injection 6 mg, 6 mg, Subcutaneous,  Once, 1 of 6 cycles Administration: 6 mg (03/04/2020) CARBOplatin (PARAPLATIN) 480 mg in sodium chloride 0.9 % 250 mL chemo infusion, 480 mg (100 % of original dose 475.2 mg), Intravenous,  Once, 1 of 6 cycles Dose modification:   (original dose 475.2 mg, Cycle 1, Reason: Provider Judgment) Administration: 480 mg (03/02/2020) fosaprepitant (EMEND) 150 mg in sodium chloride 0.9 % 145 mL IVPB, 150 mg, Intravenous,  Once, 1 of 6 cycles Administration: 150 mg (03/02/2020) PACLitaxel (TAXOL) 288 mg in sodium chloride 0.9 % 250 mL chemo infusion (> 63m/m2), 175 mg/m2 = 288 mg, Intravenous,  Once, 1 of 6 cycles Administration: 288 mg (03/02/2020)  for chemotherapy treatment.    [place]ometrial cancer ([facility]  02/11/2020 Initial Diagnosis   [place]ometrial cancer ([facility]   03/02/2020 -  Chemotherapy   The patient had palonosetron (ALOXI) injection 0.25 mg, 0.25 mg, Intravenous,  Once, 1 of 6 cycles Administration: 0.25 mg (03/02/2020) pegfilgrastim-jmdb (FULPHILA) injection 6 mg, 6 mg, Subcutaneous,  Once, 1 of 6 cycles Administration: 6 mg (03/04/2020) CARBOplatin (PARAPLATIN) 480 mg in sodium chloride 0.9 % 250 mL chemo infusion, 480 mg (100 % of original dose 475.2 mg), Intravenous,  Once, 1 of 6 cycles Dose modification:    (original dose 475.2 mg, Cycle 1, Reason: Provider Judgment) Administration: 480 mg (03/02/2020) fosaprepitant (EMEND) 150 mg in sodium chloride 0.9 % 145 mL IVPB, 150 mg, Intravenous,  Once, 1 of 6 cycles Administration: 150 mg (03/02/2020) PACLitaxel (TAXOL) 288 mg in sodium chloride 0.9 % 250 mL chemo infusion (> 89mm2), 175 mg/m2 = 288 mg, Intravenous,  Once, 1 of 6 cycles Administration: 288 mg (03/02/2020)  for chemotherapy treatment.      CANCER STAGING: Cancer Staging No matching staging information was found for the patient.  INTERVAL HISTORY:  Lisa Jenkins [age].o. female, returns for routine follow-up and consideration for next cycle of chemotherapy. Lisa Jenkins last seen on 03/10/2020.  Due for cycle #2 of carboplatin & paclitaxel today.   Overall, she tells me she has been feeling pretty well. She denies having any numbness or tingling and denies having any rashes; she denies having N/V/D. She is not in pain and her cough is stable. Her appetite is good, but her taste is unusual. She started taking magnesium and is tolerating it well.  Overall, she feels ready for next cycle of chemo today.    REVIEW OF SYSTEMS:  Review of Systems  Constitutional: Positive for appetite change (mildly decreased) and fatigue (severe).  Respiratory: Positive for cough.   Gastrointestinal: Negative for diarrhea, nausea and vomiting.  Skin: Negative for rash.  Neurological: Negative for numbness.  Psychiatric/Behavioral: Positive for depression and sleep disturbance. The patient is nervous/anxious.   All other systems reviewed and are negative.   PAST MEDICAL/SURGICAL HISTORY:  Past Medical History:  Diagnosis  Date  . Arthritis   . Cancer ([facility])   . Coronary artery disease    a. s/p NSTEMI in 07/2017 with DES to mid-RCA and residual disease along proximal-mid LAD  . Diabetes mellitus without complication ([facility])   . High cholesterol   . Hyperkalemia   . Hypertension   . Myocardial  infarction ([facility])   . Vitamin D deficiency    Past Surgical History:  Procedure Laterality Date  . ABDOMINAL HYSTERECTOMY    . Bilateral oopherectomy    . BUNIONECTOMY Bilateral   . CORONARY STENT INTERVENTION N/A 07/09/2017   Procedure: CORONARY STENT INTERVENTION;  Surgeon: Belva Crome, MD;  Location: [facility];  Service: Cardiovascular;  Laterality: N/A;  . LEFT HEART CATH AND CORONARY ANGIOGRAPHY N/A 07/09/2017   Procedure: LEFT HEART CATH AND CORONARY ANGIOGRAPHY;  Surgeon: Belva Crome, MD;  Location: [facility];  Service: Cardiovascular;  Laterality: N/A;  . PORTACATH PLACEMENT Left 03/01/2020   Procedure: INSERTION PORT-A-CATH;  Surgeon: Aviva Signs, MD;  Location: [facility];  Service: General;  Laterality: Left;  . ROBOTIC ASSISTED TOTAL HYSTERECTOMY WITH BILATERAL SALPINGO OOPHERECTOMY Bilateral 01/20/2020   Procedure: XI ROBOTIC ASSISTED TOTAL HYSTERECTOMY, UTERUS GREATER THAN 250 GRAMS WITH BILATERAL SALPINGO OOPHORECTOMY, MINI LAPAROTOMY;  Surgeon: Everitt Amber, MD;  Location: [facility];  Service: Gynecology;  Laterality: Bilateral;    SOCIAL HISTORY:  Social History   Socioeconomic History  . Marital status: Divorced    Spouse name: Not on file  . Number of children: Not on file  . Years of education: Not on file  . Highest education level: Not on file  Occupational History  . Occupation: retired  Tobacco Use  . Smoking status: Current Every Day Smoker    Packs/day: 0.50    Years: 46.00    Pack years: 23.00    Types: Cigarettes  . Smokeless tobacco: Never Used  Vaping Use  . Vaping Use: Never used  Substance and Sexual Activity  . Alcohol use: Not Currently    Alcohol/week: 0.0 standard drinks  . Drug use: No  . Sexual activity: Not Currently  Other Topics Concern  . Not on file  Social History Narrative  . Not on file   Social Determinants of Health   Financial Resource Strain: Low Risk   . Difficulty of Paying Living Expenses: Not hard at  all  Food Insecurity: No Food Insecurity  . Worried About Charity fundraiser in the Last Year: Never true  . Ran Out of Food in the Last Year: Never true  Transportation Needs: No Transportation Needs  . Lack of Transportation (Medical): No  . Lack of Transportation (Non-Medical): No  Physical Activity: Insufficiently Active  . Days of Exercise per Week: 2 days  . Minutes of Exercise per Session: 10 min  Stress: No Stress Concern Present  . Feeling of Stress : Only a little  Social Connections: Moderately Integrated  . Frequency of Communication with Friends and Family: Twice a week  . Frequency of Social Gatherings with Friends and Family: Twice a week  . Attends Religious Services: More than 4 times per year  . Active Member of Clubs or Organizations: No  . Attends Archivist Meetings: 1 to 4 times per year  . Marital Status: Divorced  Human resources officer Violence: Not At Risk  . Fear of Current or Ex-Partner: No  . Emotionally Abused: No  . Physically Abused: No  . Sexually Abused: No  FAMILY HISTORY:  Family History  Problem Relation Age of Onset  . Diabetes Mother   . Heart disease Mother   . Diabetes Sister   . Heart disease Sister   . Diabetes Brother   . Heart disease Brother   . Diabetes Brother   . Heart disease Brother   . Diabetes Brother   . Heart disease Brother   . Diabetes Brother   . Heart disease Brother   . Diabetes Brother   . Heart disease Brother   . Breast cancer Neg Hx   . Ovarian cancer Neg Hx   . Endometrial cancer Neg Hx   . Colon cancer Neg Hx     CURRENT MEDICATIONS:  Current Outpatient Medications  Medication Sig Dispense Refill  . allopurinol (ZYLOPRIM) 100 MG tablet Take 1 tablet (100 mg total) by mouth daily. 30 tablet 0  . aspirin 81 MG chewable tablet Chew 81 mg by mouth daily.     Marland Kitchen atorvastatin (LIPITOR) 80 MG tablet TAKE 1 Tablet BY MOUTH ONCE DAILY AT 6:00PM (Patient taking differently: Take 80 mg by mouth daily at  6 PM. TAKE 1 Tablet BY MOUTH ONCE DAILY AT 6:00PM) 90 tablet 1  . CARBOPLATIN IV Inject into the vein every 21 ( twenty-one) days.    . chlorthalidone (HYGROTON) 25 MG tablet Take 1 tablet by mouth once daily 90 tablet 0  . insulin degludec (TRESIBA FLEXTOUCH) 100 UNIT/ML FlexTouch Pen Inject 20 Units into the skin daily.    . magnesium oxide (MAG-OX) 400 (241.3 Mg) MG tablet Take 1 tablet (400 mg total) by mouth 2 (two) times daily. 60 tablet 1  . metFORMIN (GLUCOPHAGE) 1000 MG tablet TAKE 1 Tablet  BY MOUTH TWICE DAILY WITH MEALS (Patient taking differently: Take 1,000 mg by mouth 2 (two) times daily with a meal. ) 60 tablet 0  . metoprolol tartrate (LOPRESSOR) 25 MG tablet TAKE 1 Tablet  BY MOUTH TWICE DAILY (Patient taking differently: Take 25 mg by mouth 2 (two) times daily. ) 60 tablet 0  . OZEMPIC, 1 MG/DOSE, 2 MG/1.5ML SOPN Inject 1 mg into the skin every Thursday.    Marland Kitchen PACLITAXEL IV Inject into the vein every 21 ( twenty-one) days.    Marland Kitchen SYNTHROID 25 MCG tablet TAKE 1 Tablet BY MOUTH ONCE DAILY BEFORE BREAKFAST (Patient taking differently: Take 25 mcg by mouth daily before breakfast. ) 90 tablet 1  . traMADol (ULTRAM) 50 MG tablet Take 1 tablet (50 mg total) by mouth every 6 (six) hours as needed for severe pain. Do not take and drive 25 tablet 0  . Vitamin D, Ergocalciferol, (DRISDOL) 1.25 MG (50000 UNIT) CAPS capsule Take 50,000 Units by mouth every Tuesday.     Marland Kitchen acetaminophen (TYLENOL) 325 MG tablet Take 2 tablets (650 mg total) by mouth every 6 (six) hours. (Patient not taking: Reported on 03/23/2020) 20 tablet 0  . hydrOXYzine (ATARAX/VISTARIL) 25 MG tablet  (Patient not taking: Reported on 03/23/2020)    . lidocaine-prilocaine (EMLA) cream Apply small amount to port a cath site (do not rub in) and cover with plastic wrap 1 hour prior to chemotherapy appointments (Patient not taking: Reported on 03/23/2020) 30 g 0  . prochlorperazine (COMPAZINE) 10 MG tablet Take 1 tablet (10 mg total) by  mouth every 6 (six) hours as needed (Nausea or vomiting). (Patient not taking: Reported on 03/23/2020) 30 tablet 1  . senna-docusate (SENOKOT-S) 8.6-50 MG tablet Take 2 tablets by mouth at bedtime. Take to prevent constipation,  do not take if having diarrhea (Patient not taking: Reported on 03/23/2020) 30 tablet 0   No current facility-administered medications for this visit.   Facility-Administered Medications Ordered in Other Visits  Medication Dose Route Frequency Provider Last Rate Last Admin  . sodium chloride flush (NS) 0.9 % injection 10 mL  10 mL Intravenous PRN Derek Jack, MD   10 mL at 03/23/20 0813    ALLERGIES:  No Known Allergies  PHYSICAL EXAM:  Performance status (ECOG): 1 - Symptomatic but completely ambulatory  Vitals:   03/23/20 0814  BP: (!) 145/77  Pulse: 87  Resp: 17  Temp: 98.7 F (37.1 C)  SpO2: 99%   Wt Readings from Last 3 Encounters:  03/23/20 138 lb 12.8 oz (63 kg)  03/04/20 140 lb (63.5 kg)  03/02/20 137 lb (62.1 kg)   Physical Exam Vitals reviewed.  Constitutional:      Appearance: Normal appearance.  Neurological:     General: No focal deficit present.     Mental Status: She is alert and oriented to person, place, and time.  Psychiatric:        Mood and Affect: Mood normal.        Behavior: Behavior normal.     LABORATORY DATA:  I have reviewed the labs as listed.  CBC Latest Ref Rng & Units 03/23/2020 03/10/2020 03/02/2020  WBC 4.0 - 10.5 K/uL 8.6 16.9(H) 10.4  Hemoglobin 12.0 - 15.0 g/dL 8.9(L) 9.8(L) 10.2(L)  Hematocrit 36 - 46 % 28.8(L) 31.7(L) 32.8(L)  Platelets 150 - 400 K/uL 378 189 407(H)   CMP Latest Ref Rng & Units 03/10/2020 03/02/2020 02/11/2020  Glucose 70 - 99 mg/dL 66(L) 91 125(H)  BUN 8 - 23 mg/dL 24(H) 11 12  Creatinine 0.44 - 1.00 mg/dL 0.97 0.75 0.86  Sodium 135 - 145 mmol/L 137 140 142  Potassium 3.5 - 5.1 mmol/L 4.3 4.4 4.6  Chloride 98 - 111 mmol/L 106 107 106  CO2 22 - 32 mmol/L 22 23 24   Calcium 8.9 - 10.3  mg/dL 8.4(L) 8.4(L) 9.4  Total Protein 6.5 - 8.1 g/dL 6.4(L) 6.6 -  Total Bilirubin 0.3 - 1.2 mg/dL 0.5 0.5 -  Alkaline Phos 38 - 126 U/L 147(H) 109 -  AST 15 - 41 U/L 22 13(L) -  ALT 0 - 44 U/L 19 10 -   Lab Results  Component Value Date   LDH 192 03/10/2020     DIAGNOSTIC IMAGING:  I have independently reviewed the scans and discussed with the patient. CT Chest W Contrast  Result Date: 03/02/2020 CLINICAL DATA:  Stage III endometrial carcinosarcoma. EXAM: CT CHEST WITH CONTRAST TECHNIQUE: Multidetector CT imaging of the chest was performed during intravenous contrast administration. CONTRAST:  26m OMNIPAQUE IOHEXOL 300 MG/ML  SOLN COMPARISON:  CT angio chest 07/28/2016 FINDINGS: Cardiovascular: Normal heart size. Aortic atherosclerosis. Lad, left circumflex and RCA coronary artery calcifications. Mediastinum/Nodes: No enlarged mediastinal, hilar, or axillary lymph nodes. Thyroid gland, trachea, and esophagus demonstrate no significant findings. Lungs/Pleura: No pleural effusion. No airspace consolidation, atelectasis or pneumothorax. Several pulmonary nodules are identified including: Anteromedial left upper lobe nodule measures 1.0 cm, image 49/4. New from previous exam. Right upper lobe lung nodule measures 0.7 cm, image 38/4. Also new from previous exam. Posterior left upper lobe lung nodule measures 2 mm, image 65/4. New from previous exam. Upper Abdomen: No acute abnormality identified within the imaged portions of the upper abdomen. Left adrenal lesion is again noted measuring 3.1 x 2.6 cm, unchanged, image  145/2. Also unchanged is a right adrenal nodule measuring 1.1 x 0.8 cm, image 147/2. Musculoskeletal: No chest wall abnormality. No acute or significant osseous findings. IMPRESSION: 1. There are several pulmonary nodules identified within both lungs measuring up to 1.0 cm. New from 07/28/2016 suspicious for metastatic disease. 2. Multi vessel coronary artery calcifications noted. 3.  Stable bilateral adrenal nodules. 4. Aortic atherosclerosis. Aortic Atherosclerosis (ICD10-I70.0). Electronically Signed   By: Kerby Moors M.D.   On: 03/02/2020 10:28   DG Chest Port 1 View  Result Date: 03/01/2020 CLINICAL DATA:  Status post Port-A-Cath placement. EXAM: PORTABLE CHEST 1 VIEW COMPARISON:  July 08, 2017. FINDINGS: The heart size and mediastinal contours are within normal limits. Both lungs are clear. Left subclavian Port-A-Cath is noted with distal tip in expected position of cavoatrial junction. No pneumothorax or pleural effusion is noted. The visualized skeletal structures are unremarkable. IMPRESSION: Left subclavian Port-A-Cath is noted with distal tip in expected position of cavoatrial junction. Electronically Signed   By: Marijo Conception M.D.   On: 03/01/2020 14:57   DG C-Arm 1-60 Min-No Report  Result Date: 03/01/2020 Fluoroscopy was utilized by the requesting physician.  No radiographic interpretation.     ASSESSMENT:  1. 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 -CTAP on 01/07/2020 shows large heterogeneous cystic and solid pelvic mass. 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 Indeterminate 11 mm right adrenal nodule. Small bilateral renal cysts. -TAH and BSO on 01/20/2020 shows high-grade carcinosarcoma with tumor invading through myometrium and involves the serosa. -MMR was normal. -CT chest on 03/02/2020 showed several pulmonary nodules in both lungs measuring up to 1 cm, new from 07/28/2016 suspicious for metastatic disease. -Vaginal brachytherapy was put on hold based on CT scan of the chest findings showing metastatic disease. -Carboplatin and paclitaxel cycle 1 started on 03/02/2020.   PLAN:  1. Stage III high-grade carcinosarcoma of the uterus: -She has tolerated first cycle very well. -I reviewed her labs which showed normal LFTs.  Albumin is slightly low at 3.4.  White  count and platelet count is normal. -We will proceed with her cycle 2 today.  I will see her back in 3 weeks for follow-up.  2. Diabetes: -Continue Metformin and Ozempic.  3. Hypothyroidism: -Continue Synthroid.  4. Hypertension: -Continue chlorthalidone and Lopressor.  5.  Hypomagnesemia: -She is currently taking magnesium 1 tablet twice daily. -Magnesium today is 1.2.  We will give her 3 g of magnesium IV.  I will increase her home magnesium to 2 tablets twice daily.   Orders placed this encounter:  No orders of the defined types were placed in this encounter.    Derek Jack, MD [place] [phone]   I, Milinda Antis, am acting as a scribe for Dr. Sanda Linger.  I, Derek Jack MD, have reviewed the above documentation for accuracy and completeness, and I agree with the above.

## 2020-03-23 NOTE — Progress Notes (Signed)
Patient has been assessed, vital signs and labs have been reviewed by Dr. Delton Coombes. ANC, Creatinine, LFTs, and Platelets are within treatment parameters, magnesium is low, please give 3 grams magnesium IV  per Dr. Delton Coombes. The patient is good to proceed with treatment at this time.

## 2020-03-23 NOTE — Patient Instructions (Addendum)
[name] at [facility] Discharge Instructions  You were seen today by Dr. Delton Coombes. He went over your recent results. You received your treatment today. Start taking 2 tablets of magnesium twice daily. Dr. Delton Coombes will see you back in 3 weeks for labs and follow up.   Thank you for choosing [facility] at [facility] to provide your oncology and hematology care.  To afford each patient quality time with our provider, please arrive at least 15 minutes before your scheduled appointment time.   If you have a lab appointment with the [facility] please come in thru the Main Entrance and check in at the main information desk  You need to re-schedule your appointment should you arrive 10 or more minutes late.  We strive to give you quality time with our providers, and arriving late affects you and other patients whose appointments are after yours.  Also, if you no show three or more times for appointments you may be dismissed from the clinic at the providers discretion.     Again, thank you for choosing [facility].  Our hope is that these requests will decrease the amount of time that you wait before being seen by our physicians.       _____________________________________________________________  Should you have questions after your visit to [facility], please contact our office at [phone] between the hours of 8:00 a.m. and 4:30 p.m.  Voicemails left after 4:00 p.m. will not be returned until the following business day.  For prescription refill requests, have your pharmacy contact our office and allow 72 hours.    Cancer Center Support Programs:   > Cancer Support Group  2nd Tuesday of the month 1pm-2pm, Journey Room

## 2020-03-23 NOTE — Progress Notes (Signed)
0936 Labs reviewed with and pt seen by Dr. Delton Coombes and pt approved for Taxol and Carboplatin infusions today with Magnesium 3 grams IV added per MD                                            Lisa Jenkins tolerated chemo tx well without complaints or incident. VSS upon discharge. Pt discharged self ambulatory in satisfactory condition accompanied by family member

## 2020-03-23 NOTE — Patient Instructions (Addendum)
[facility] Discharge Instructions for Patients Receiving Chemotherapy   Beginning January 23rd 2017 lab work for the [facility] will be done in the  Main lab at [facility] on 1st floor. If you have a lab appointment with the [facility] please come in thru the  Main Entrance and check in at the main information desk   Today you received the following chemotherapy agent Taxol and Carboplatin as well as Magnesium infusion. Follow-up as scheduled  To help prevent nausea and vomiting after your treatment, we encourage you to take your nausea medication   If you develop nausea and vomiting, or diarrhea that is not controlled by your medication, call the clinic.  The clinic phone number is [phone]. Office hours are Monday-Friday 8:30am-5:00pm.  BELOW ARE SYMPTOMS THAT SHOULD BE REPORTED IMMEDIATELY:  *FEVER GREATER THAN 101.0 F  *CHILLS WITH OR WITHOUT FEVER  NAUSEA AND VOMITING THAT IS NOT CONTROLLED WITH YOUR NAUSEA MEDICATION  *UNUSUAL SHORTNESS OF BREATH  *UNUSUAL BRUISING OR BLEEDING  TENDERNESS IN MOUTH AND THROAT WITH OR WITHOUT PRESENCE OF ULCERS  *URINARY PROBLEMS  *BOWEL PROBLEMS  UNUSUAL RASH Items with * indicate a potential emergency and should be followed up as soon as possible. If you have an emergency after office hours please contact your primary care physician or go to the nearest emergency department.  Please call the clinic during office hours if you have any questions or concerns.   You may also contact the Patient Navigator at [phone] should you have any questions or need assistance in obtaining follow up care.      Resources For Cancer Patients and their Caregivers ? American Cancer Society: Can assist with transportation, wigs, general needs, runs Look Good Feel Better.        ([phone] 037-9048 ? Cancer Care: Provides financial assistance, online support groups, medication/co-pay assistance.  1-800-813-HOPE  770 686 5274) ? [place] Assists [place] cancer patients and their families through emotional , educational and financial support.  (985) 015-2953 ? [place] DSS Where to apply for food stamps, Medicaid and utility assistance. 505-077-4230 ? [facility]: Transportation to medical appointments. 581-296-4355 ? Social Security Administration: May apply for disability if have a Stage IV cancer. 252-455-3427 475-557-5701 ? [employer], Disability and Transit Services: Assists with nutrition, care and transit needs. 9860592541

## 2020-03-25 ENCOUNTER — Inpatient Hospital Stay (HOSPITAL_COMMUNITY): Payer: Medicare HMO

## 2020-03-25 ENCOUNTER — Encounter (HOSPITAL_COMMUNITY): Payer: Self-pay

## 2020-03-25 VITALS — BP 139/65 | HR 60 | Temp 99.0°F | Resp 18

## 2020-03-25 DIAGNOSIS — C549 Malignant neoplasm of corpus uteri, unspecified: Secondary | ICD-10-CM

## 2020-03-25 DIAGNOSIS — C541 Malignant neoplasm of endometrium: Secondary | ICD-10-CM

## 2020-03-25 DIAGNOSIS — Z5111 Encounter for antineoplastic chemotherapy: Secondary | ICD-10-CM | POA: Diagnosis not present

## 2020-03-25 MED ORDER — PEGFILGRASTIM-JMDB 6 MG/0.6ML ~~LOC~~ SOSY
6.0000 mg | PREFILLED_SYRINGE | Freq: Once | SUBCUTANEOUS | Status: AC
  Administered 2020-03-25: 6 mg via SUBCUTANEOUS
  Filled 2020-03-25: qty 0.6

## 2020-03-25 NOTE — Progress Notes (Signed)
[name] presents today for injection per the provider's orders.  fulphila administration without incident; injection site WNL; see MAR for injection details.  Patient tolerated procedure well and without incident. Only fatigue noted at this time. Pt d/c clinic via wheel chair.

## 2020-04-01 ENCOUNTER — Telehealth (HOSPITAL_COMMUNITY): Payer: Self-pay | Admitting: *Deleted

## 2020-04-01 NOTE — Telephone Encounter (Signed)
Pt states that every time eats she is having diarrhea. She has had diarrhea for the last four days.She states that she has been taking an antidiarrheal medication and it has been helping. She has not had any diarrhea today. Pt also asked about the Claritin and why she needed to take it. I explained to her that she should take it when she has her Fulphila injection. The pt verbalized understanding. Pt was also advised to make sure that she drinks plenty of fluids. Pt verbalized understanding.

## 2020-04-05 ENCOUNTER — Other Ambulatory Visit: Payer: Self-pay

## 2020-04-05 ENCOUNTER — Ambulatory Visit (INDEPENDENT_AMBULATORY_CARE_PROVIDER_SITE_OTHER): Payer: Self-pay | Admitting: *Deleted

## 2020-04-05 VITALS — Ht 63.0 in | Wt 136.0 lb

## 2020-04-05 DIAGNOSIS — Z1211 Encounter for screening for malignant neoplasm of colon: Secondary | ICD-10-CM

## 2020-04-05 NOTE — Progress Notes (Signed)
Pt has 4 more chemo treatments left to do.  She said that she takes them every 3 weeks so she will be tied up for 12 weeks.  She would like to postpone colonoscopy until she finishes her treatments.  We rescheduled nurse visit so we can have more recent triage.

## 2020-04-13 ENCOUNTER — Inpatient Hospital Stay (HOSPITAL_BASED_OUTPATIENT_CLINIC_OR_DEPARTMENT_OTHER): Payer: Medicare HMO | Admitting: Hematology

## 2020-04-13 ENCOUNTER — Inpatient Hospital Stay (HOSPITAL_COMMUNITY): Payer: Medicare HMO

## 2020-04-13 ENCOUNTER — Inpatient Hospital Stay (HOSPITAL_COMMUNITY): Payer: Medicare HMO | Attending: Hematology

## 2020-04-13 ENCOUNTER — Other Ambulatory Visit: Payer: Self-pay

## 2020-04-13 VITALS — BP 130/60 | HR 84 | Temp 97.7°F | Resp 18 | Wt 135.8 lb

## 2020-04-13 VITALS — BP 132/60 | HR 77 | Temp 97.3°F | Resp 18

## 2020-04-13 DIAGNOSIS — Z5189 Encounter for other specified aftercare: Secondary | ICD-10-CM | POA: Insufficient documentation

## 2020-04-13 DIAGNOSIS — D649 Anemia, unspecified: Secondary | ICD-10-CM | POA: Diagnosis not present

## 2020-04-13 DIAGNOSIS — Z7982 Long term (current) use of aspirin: Secondary | ICD-10-CM | POA: Insufficient documentation

## 2020-04-13 DIAGNOSIS — E7800 Pure hypercholesterolemia, unspecified: Secondary | ICD-10-CM | POA: Insufficient documentation

## 2020-04-13 DIAGNOSIS — Z79899 Other long term (current) drug therapy: Secondary | ICD-10-CM | POA: Insufficient documentation

## 2020-04-13 DIAGNOSIS — F17210 Nicotine dependence, cigarettes, uncomplicated: Secondary | ICD-10-CM | POA: Insufficient documentation

## 2020-04-13 DIAGNOSIS — Z90710 Acquired absence of both cervix and uterus: Secondary | ICD-10-CM | POA: Diagnosis not present

## 2020-04-13 DIAGNOSIS — C549 Malignant neoplasm of corpus uteri, unspecified: Secondary | ICD-10-CM

## 2020-04-13 DIAGNOSIS — C541 Malignant neoplasm of endometrium: Secondary | ICD-10-CM | POA: Diagnosis present

## 2020-04-13 DIAGNOSIS — I10 Essential (primary) hypertension: Secondary | ICD-10-CM | POA: Insufficient documentation

## 2020-04-13 DIAGNOSIS — Z833 Family history of diabetes mellitus: Secondary | ICD-10-CM | POA: Insufficient documentation

## 2020-04-13 DIAGNOSIS — Z9079 Acquired absence of other genital organ(s): Secondary | ICD-10-CM | POA: Diagnosis not present

## 2020-04-13 DIAGNOSIS — I252 Old myocardial infarction: Secondary | ICD-10-CM | POA: Diagnosis not present

## 2020-04-13 DIAGNOSIS — Z5111 Encounter for antineoplastic chemotherapy: Secondary | ICD-10-CM | POA: Insufficient documentation

## 2020-04-13 DIAGNOSIS — I2510 Atherosclerotic heart disease of native coronary artery without angina pectoris: Secondary | ICD-10-CM | POA: Insufficient documentation

## 2020-04-13 DIAGNOSIS — R918 Other nonspecific abnormal finding of lung field: Secondary | ICD-10-CM | POA: Diagnosis not present

## 2020-04-13 DIAGNOSIS — Z90722 Acquired absence of ovaries, bilateral: Secondary | ICD-10-CM | POA: Insufficient documentation

## 2020-04-13 DIAGNOSIS — E039 Hypothyroidism, unspecified: Secondary | ICD-10-CM | POA: Insufficient documentation

## 2020-04-13 DIAGNOSIS — Z8249 Family history of ischemic heart disease and other diseases of the circulatory system: Secondary | ICD-10-CM | POA: Diagnosis not present

## 2020-04-13 DIAGNOSIS — E119 Type 2 diabetes mellitus without complications: Secondary | ICD-10-CM | POA: Diagnosis not present

## 2020-04-13 DIAGNOSIS — Z794 Long term (current) use of insulin: Secondary | ICD-10-CM | POA: Diagnosis not present

## 2020-04-13 LAB — COMPREHENSIVE METABOLIC PANEL
ALT: 10 U/L (ref 0–44)
AST: 10 U/L — ABNORMAL LOW (ref 15–41)
Albumin: 3 g/dL — ABNORMAL LOW (ref 3.5–5.0)
Alkaline Phosphatase: 107 U/L (ref 38–126)
Anion gap: 10 (ref 5–15)
BUN: 18 mg/dL (ref 8–23)
CO2: 23 mmol/L (ref 22–32)
Calcium: 8.3 mg/dL — ABNORMAL LOW (ref 8.9–10.3)
Chloride: 105 mmol/L (ref 98–111)
Creatinine, Ser: 0.79 mg/dL (ref 0.44–1.00)
GFR calc Af Amer: >60 mL/min (ref >60)
GFR calc non Af Amer: >60 mL/min (ref >60)
Glucose, Bld: 113 mg/dL — ABNORMAL HIGH (ref 70–99)
Potassium: 4.2 mmol/L (ref 3.5–5.1)
Sodium: 138 mmol/L (ref 135–145)
Total Bilirubin: 0.3 mg/dL (ref 0.3–1.2)
Total Protein: 6.4 g/dL — ABNORMAL LOW (ref 6.5–8.1)

## 2020-04-13 LAB — CBC WITH DIFFERENTIAL/PLATELET
Abs Immature Granulocytes: 0.02 10*3/uL (ref 0.00–0.07)
Basophils Absolute: 0 10*3/uL (ref 0.0–0.1)
Basophils Relative: 0 %
Eosinophils Absolute: 0 10*3/uL (ref 0.0–0.5)
Eosinophils Relative: 0 %
HCT: 23.1 % — ABNORMAL LOW (ref 36.0–46.0)
Hemoglobin: 7.3 g/dL — ABNORMAL LOW (ref 12.0–15.0)
Immature Granulocytes: 0 %
Lymphocytes Relative: 21 %
Lymphs Abs: 1.7 10*3/uL (ref 0.7–4.0)
MCH: 30.7 pg (ref 26.0–34.0)
MCHC: 31.6 g/dL (ref 30.0–36.0)
MCV: 97.1 fL (ref 80.0–100.0)
Monocytes Absolute: 0.7 10*3/uL (ref 0.1–1.0)
Monocytes Relative: 9 %
Neutro Abs: 5.7 10*3/uL (ref 1.7–7.7)
Neutrophils Relative %: 70 %
Platelets: 148 10*3/uL — ABNORMAL LOW (ref 150–400)
RBC: 2.38 MIL/uL — ABNORMAL LOW (ref 3.87–5.11)
RDW: 18.6 % — ABNORMAL HIGH (ref 11.5–15.5)
WBC: 8.1 10*3/uL (ref 4.0–10.5)
nRBC: 0 % (ref 0.0–0.2)

## 2020-04-13 LAB — MAGNESIUM: Magnesium: 1 mg/dL — ABNORMAL LOW (ref 1.7–2.4)

## 2020-04-13 LAB — PREPARE RBC (CROSSMATCH)

## 2020-04-13 MED ORDER — SODIUM CHLORIDE 0.9% FLUSH
10.0000 mL | INTRAVENOUS | Status: DC | PRN
  Administered 2020-04-13: 10 mL

## 2020-04-13 MED ORDER — SODIUM CHLORIDE 0.9 % IV SOLN
150.0000 mg | Freq: Once | INTRAVENOUS | Status: AC
  Administered 2020-04-13: 150 mg via INTRAVENOUS
  Filled 2020-04-13: qty 150

## 2020-04-13 MED ORDER — FAMOTIDINE IN NACL 20-0.9 MG/50ML-% IV SOLN
20.0000 mg | Freq: Once | INTRAVENOUS | Status: AC
  Administered 2020-04-13: 20 mg via INTRAVENOUS
  Filled 2020-04-13: qty 50

## 2020-04-13 MED ORDER — SODIUM CHLORIDE 0.9 % IV SOLN: Freq: Once | INTRAVENOUS | Status: AC

## 2020-04-13 MED ORDER — DIPHENHYDRAMINE HCL 50 MG/ML IJ SOLN
50.0000 mg | Freq: Once | INTRAMUSCULAR | Status: AC
  Administered 2020-04-13: 50 mg via INTRAVENOUS
  Filled 2020-04-13: qty 1

## 2020-04-13 MED ORDER — SODIUM CHLORIDE 0.9 % IV SOLN
175.0000 mg/m2 | Freq: Once | INTRAVENOUS | Status: AC
  Administered 2020-04-13: 288 mg via INTRAVENOUS
  Filled 2020-04-13: qty 48

## 2020-04-13 MED ORDER — SODIUM CHLORIDE 0.9 % IV SOLN
475.2000 mg | Freq: Once | INTRAVENOUS | Status: AC
  Administered 2020-04-13: 480 mg via INTRAVENOUS
  Filled 2020-04-13: qty 48

## 2020-04-13 MED ORDER — SODIUM CHLORIDE 0.9 % IV SOLN
Freq: Once | INTRAVENOUS | Status: AC
  Filled 2020-04-13: qty 250

## 2020-04-13 MED ORDER — HEPARIN SOD (PORK) LOCK FLUSH 100 UNIT/ML IV SOLN
500.0000 [IU] | Freq: Once | INTRAVENOUS | Status: AC | PRN
  Administered 2020-04-13: 500 [IU]

## 2020-04-13 MED ORDER — PALONOSETRON HCL INJECTION 0.25 MG/5ML
0.2500 mg | Freq: Once | INTRAVENOUS | Status: AC
  Administered 2020-04-13: 0.25 mg via INTRAVENOUS
  Filled 2020-04-13: qty 5

## 2020-04-13 MED ORDER — SODIUM CHLORIDE 0.9 % IV SOLN
10.0000 mg | Freq: Once | INTRAVENOUS | Status: AC
  Administered 2020-04-13: 10 mg via INTRAVENOUS
  Filled 2020-04-13: qty 10

## 2020-04-13 NOTE — Progress Notes (Signed)
Hgb 7.3 today.  Patient will received a unit of blood tomorrow and ok for treatment today verbal order.  Patient instructed how to take her magnesium tabs for today's low magnesium level per Dr. Delton Coombes.  Patient tolerated chemotherapy with no complaints voiced.  Side effects with management reviewed with understanding verbalized.  Port site clean and dry with no bruising or swelling noted at site.  Good blood return noted before and after administration of chemotherapy.  Dressing intact.   Patient left in satisfactory condition with VSS and no s/s of distress noted.

## 2020-04-13 NOTE — Progress Notes (Signed)
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[place])   03/02/2020 -  Chemotherapy   The patient had palonosetron (ALOXI) injection 0.25 mg, 0.25 mg, Intravenous,  Once, 2 of 6 cycles Administration: 0.25 mg (03/02/2020), 0.25 mg (03/23/2020) pegfilgrastim-jmdb (FULPHILA) injection 6 mg, 6 mg, Subcutaneous,  Once, 2 of 6 cycles Administration: 6 mg (03/04/2020) CARBOplatin (PARAPLATIN) 480 mg in sodium chloride 0.9 % 250 mL chemo infusion, 480 mg (100 % of original dose 475.2 mg), Intravenous,  Once, 2 of 6 cycles Dose modification:   (original dose 475.2 mg, Cycle 1, Reason: Provider Judgment),   (original dose 475.2 mg, Cycle 2) Administration: 480 mg (03/02/2020), 480 mg (03/23/2020) fosaprepitant (EMEND) 150 mg in sodium chloride 0.9 % 145 mL IVPB, 150 mg, Intravenous,  Once, 2 of 6 cycles Administration: 150 mg (03/02/2020), 150 mg (03/23/2020) PACLitaxel (TAXOL) 288 mg in sodium chloride 0.9 % 250 mL chemo infusion (> 42m/m2), 175 mg/m2 = 288 mg, Intravenous,  Once, 2 of 6 cycles Administration: 288 mg (03/02/2020), 288 mg (03/23/2020)  for chemotherapy treatment.    [place]  02/[place] Initial Diagnosis   [place]   03/02/2020 -  Chemotherapy   The patient had palonosetron (ALOXI) injection 0.25 mg, 0.25 mg, Intravenous,  Once, 2 of 6 cycles Administration: 0.25 mg (03/02/2020), 0.25 mg (03/23/2020) pegfilgrastim-jmdb (FULPHILA) injection 6 mg, 6 mg, Subcutaneous,  Once, 2 of 6 cycles Administration: 6 mg  (03/04/2020) CARBOplatin (PARAPLATIN) 480 mg in sodium chloride 0.9 % 250 mL chemo infusion, 480 mg (100 % of original dose 475.2 mg), Intravenous,  Once, 2 of 6 cycles Dose modification:   (original dose 475.2 mg, Cycle 1, Reason: Provider Judgment),   (original dose 475.2 mg, Cycle 2) Administration: 480 mg (03/02/2020), 480 mg (03/23/2020) fosaprepitant (EMEND) 150 mg in sodium chloride 0.9 % 145 mL IVPB, 150 mg, Intravenous,  Once, 2 of 6 cycles Administration: 150 mg (03/02/2020), 150 mg (03/23/2020) PACLitaxel (TAXOL) 288 mg in sodium chloride 0.9 % 250 mL chemo infusion (> 871mm2), 175 mg/m2 = 288 mg, Intravenous,  Once, 2 of 6 cycles Administration: 288 mg (03/02/2020), 288 mg (03/23/2020)  for chemotherapy treatment.      CANCER STAGING: Cancer Staging No matching staging information was found for the patient.  INTERVAL HISTORY:  Ms. RiIVYROSE Jenkins [age].o. female, returns for routine follow-up and consideration for next cycle of chemotherapy. [name] last seen on 03/23/2020.  Due for cycle #3 of carboplatin and paclitaxel today.   Today she feels a little worse. She tolerated the previous treatment well, though she reports that she was having issues with constipation; she takes milk of magnesia for it. She reports that she takes magnesium 1 tablet daily. She complains of bone pains in her legs lasting about 3 days after the treatment, for which she takes Tylenol and it helps. She denies having any recent infections. Her appetite is good and she denies N/V/D, numbness, or abnormal bleeding.  Overall, she feels ready for next cycle of chemo today.  REVIEW OF SYSTEMS:  Review of Systems  Constitutional: Positive for appetite change (mildly decreased) and fatigue (depleted). Negative for chills and fever.  HENT:   Negative for nosebleeds.   Gastrointestinal: Positive for constipation. Negative for blood in stool, diarrhea, nausea and vomiting.  Genitourinary: Negative for hematuria.    Musculoskeletal: Positive for arthralgias (9/10 bilat legs pain).  Neurological: Negative for numbness.  All other systems reviewed and are negative.   PAST MEDICAL/SURGICAL HISTORY:  Past Medical History:  Diagnosis Date  . Arthritis   . Cancer ([place])   . Coronary artery disease    a. s/p NSTEMI in 07/2017 with DES to mid-RCA and residual disease along proximal-mid LAD  . Diabetes mellitus without complication ([place])   . High cholesterol   . Hyperkalemia   . Hypertension   . Myocardial infarction ([place])   . Vitamin D deficiency    Past Surgical History:  Procedure Laterality Date  . ABDOMINAL HYSTERECTOMY    . Bilateral oopherectomy    . BUNIONECTOMY Bilateral   . CORONARY STENT INTERVENTION N/A 07/09/2017   Procedure: CORONARY STENT INTERVENTION;  Surgeon: Belva Crome, MD;  Location: [facility];  Service: Cardiovascular;  Laterality: N/A;  . LEFT HEART CATH AND CORONARY ANGIOGRAPHY N/A 07/09/2017   Procedure: LEFT HEART CATH AND CORONARY ANGIOGRAPHY;  Surgeon: Belva Crome, MD;  Location: [facility];  Service: Cardiovascular;  Laterality: N/A;  . PORTACATH PLACEMENT Left 03/01/2020   Procedure: INSERTION PORT-A-CATH;  Surgeon: Aviva Signs, MD;  Location: [facility];  Service: General;  Laterality: Left;  . ROBOTIC ASSISTED TOTAL HYSTERECTOMY WITH BILATERAL SALPINGO OOPHERECTOMY Bilateral 01/20/2020   Procedure: XI ROBOTIC ASSISTED TOTAL HYSTERECTOMY, UTERUS GREATER THAN 250 GRAMS WITH BILATERAL SALPINGO OOPHORECTOMY, MINI LAPAROTOMY;  Surgeon: Everitt Amber, MD;  Location: [facility];  Service: Gynecology;  Laterality: Bilateral;    SOCIAL HISTORY:  Social History   Socioeconomic History  . Marital status: Divorced    Spouse name: Not on file  . Number of children: Not on file  . Years of education: Not on file  . Highest education level: Not on file  Occupational History  . Occupation: retired  Tobacco Use  . Smoking status: Current Every Day Smoker     Packs/day: 0.50    Years: 46.00    Pack years: 23.00    Types: Cigarettes  . Smokeless tobacco: Never Used  Vaping Use  . Vaping Use: Never used  Substance and Sexual Activity  . Alcohol use: Not Currently    Alcohol/week: 0.0 standard drinks  . Drug use: No  . Sexual activity: Not Currently  Other Topics Concern  . Not on file  Social History Narrative  . Not on file   Social Determinants of Health   Financial Resource Strain: Low Risk   . Difficulty of Paying Living Expenses: Not hard at all  Food Insecurity: No Food Insecurity  . Worried About Charity fundraiser in the Last Year: Never true  . Ran Out of Food in the Last Year: Never true  Transportation Needs: No Transportation Needs  . Lack of Transportation (Medical): No  . Lack of Transportation (Non-Medical): No  Physical Activity: Insufficiently Active  . Days of Exercise per Week: 2 days  . Minutes of Exercise per Session: 10 min  Stress: No Stress Concern Present  . Feeling of Stress : Only a little  Social Connections: Moderately Integrated  . Frequency of Communication with Friends and Family: Twice a week  .  Frequency of Social Gatherings with Friends and Family: Twice a week  . Attends Religious Services: More than 4 times per year  . Active Member of Clubs or Organizations: No  . Attends Archivist Meetings: 1 to 4 times per year  . Marital Status: Divorced  Human resources officer Violence: Not At Risk  . Fear of Current or Ex-Partner: No  . Emotionally Abused: No  . Physically Abused: No  . Sexually Abused: No    FAMILY HISTORY:  Family History  Problem Relation Age of Onset  . Diabetes Mother   . Heart disease Mother   . Diabetes Sister   . Heart disease Sister   . Diabetes Brother   . Heart disease Brother   . Diabetes Brother   . Heart disease Brother   . Diabetes Brother   . Heart disease Brother   . Diabetes Brother   . Heart disease Brother   . Diabetes Brother   . Heart disease  Brother   . Breast cancer Neg Hx   . Ovarian cancer Neg Hx   . Endometrial cancer Neg Hx   . Colon cancer Neg Hx     CURRENT MEDICATIONS:  Current Outpatient Medications  Medication Sig Dispense Refill  . acetaminophen (TYLENOL) 325 MG tablet Take 2 tablets (650 mg total) by mouth every 6 (six) hours. 20 tablet 0  . allopurinol (ZYLOPRIM) 100 MG tablet Take 1 tablet (100 mg total) by mouth daily. 30 tablet 0  . aspirin 81 MG chewable tablet Chew 81 mg by mouth daily.     Marland Kitchen atorvastatin (LIPITOR) 80 MG tablet TAKE 1 Tablet BY MOUTH ONCE DAILY AT 6:00PM (Patient taking differently: Take 80 mg by mouth daily at 6 PM. TAKE 1 Tablet BY MOUTH ONCE DAILY AT 6:00PM) 90 tablet 1  . CARBOPLATIN IV Inject into the vein every 21 ( twenty-one) days.    . chlorthalidone (HYGROTON) 25 MG tablet Take 1 tablet by mouth once daily 90 tablet 0  . hydrOXYzine (ATARAX/VISTARIL) 25 MG tablet     . insulin degludec (TRESIBA FLEXTOUCH) 100 UNIT/ML FlexTouch Pen Inject 20 Units into the skin daily.    . magnesium oxide (MAG-OX) 400 (241.3 Mg) MG tablet Take 1 tablet (400 mg total) by mouth 2 (two) times daily. 60 tablet 1  . metFORMIN (GLUCOPHAGE) 1000 MG tablet TAKE 1 Tablet  BY MOUTH TWICE DAILY WITH MEALS (Patient taking differently: Take 1,000 mg by mouth 2 (two) times daily with a meal. ) 60 tablet 0  . metoprolol tartrate (LOPRESSOR) 25 MG tablet TAKE 1 Tablet  BY MOUTH TWICE DAILY (Patient taking differently: Take 25 mg by mouth 2 (two) times daily. ) 60 tablet 0  . OZEMPIC, 1 MG/DOSE, 2 MG/1.5ML SOPN Inject 1 mg into the skin every Thursday.    Marland Kitchen PACLITAXEL IV Inject into the vein every 21 ( twenty-one) days.    Marland Kitchen senna-docusate (SENOKOT-S) 8.6-50 MG tablet Take 2 tablets by mouth at bedtime. Take to prevent constipation, do not take if having diarrhea 30 tablet 0  . SYNTHROID 25 MCG tablet TAKE 1 Tablet BY MOUTH ONCE DAILY BEFORE BREAKFAST (Patient taking differently: Take 25 mcg by mouth daily before  breakfast. ) 90 tablet 1  . Vitamin D, Ergocalciferol, (DRISDOL) 1.25 MG (50000 UNIT) CAPS capsule Take 50,000 Units by mouth every Tuesday.     . lidocaine-prilocaine (EMLA) cream Apply small amount to port a cath site (do not rub in) and cover with plastic wrap 1  hour prior to chemotherapy appointments (Patient not taking: Reported on 04/13/2020) 30 g 0  . prochlorperazine (COMPAZINE) 10 MG tablet Take 1 tablet (10 mg total) by mouth every 6 (six) hours as needed (Nausea or vomiting). (Patient not taking: Reported on 04/13/2020) 30 tablet 1  . traMADol (ULTRAM) 50 MG tablet Take 1 tablet (50 mg total) by mouth every 6 (six) hours as needed for severe pain. Do not take and drive (Patient not taking: Reported on 04/13/2020) 25 tablet 0   No current facility-administered medications for this visit.    ALLERGIES:  No Known Allergies  PHYSICAL EXAM:  Performance status (ECOG): 1 - Symptomatic but completely ambulatory  Vitals:   04/13/20 0758  BP: 130/60  Pulse: 84  Resp: 18  Temp: 97.7 F (36.5 C)  SpO2: 98%   Wt Readings from Last 3 Encounters:  04/13/20 135 lb 12.8 oz (61.6 kg)  04/05/20 136 lb (61.7 kg)  03/23/20 138 lb 12.8 oz (63 kg)   Physical Exam Vitals reviewed.  Constitutional:      Appearance: Normal appearance.  Cardiovascular:     Rate and Rhythm: Normal rate and regular rhythm.     Pulses: Normal pulses.     Heart sounds: Normal heart sounds.  Pulmonary:     Effort: Pulmonary effort is normal.     Breath sounds: Normal breath sounds.  Chest:     Comments: Port-a-Cath on L chest Abdominal:     Palpations: Abdomen is soft. There is no mass.     Tenderness: There is no abdominal tenderness.  Musculoskeletal:     Right lower leg: No edema.     Left lower leg: No edema.  Neurological:     General: No focal deficit present.     Mental Status: She is alert and oriented to person, place, and time.  Psychiatric:        Mood and Affect: Mood normal.         Behavior: Behavior normal.     LABORATORY DATA:  I have reviewed the labs as listed.  CBC Latest Ref Rng & Units 04/13/2020 03/23/2020 03/10/2020  WBC 4.0 - 10.5 K/uL 8.1 8.6 16.9(H)  Hemoglobin 12.0 - 15.0 g/dL 7.3(L) 8.9(L) 9.8(L)  Hematocrit 36 - 46 % 23.1(L) 28.8(L) 31.7(L)  Platelets 150 - 400 K/uL 148(L) 378 189   CMP Latest Ref Rng & Units 04/13/2020 03/23/2020 03/10/2020  Glucose 70 - 99 mg/dL 113(H) 80 66(L)  BUN 8 - 23 mg/dL 18 12 24(H)  Creatinine 0.44 - 1.00 mg/dL 0.79 0.72 0.97  Sodium 135 - 145 mmol/L 138 139 137  Potassium 3.5 - 5.1 mmol/L 4.2 4.4 4.3  Chloride 98 - 111 mmol/L 105 105 106  CO2 22 - 32 mmol/L _0 Calcium 8.9 - 10.3 mg/dL 8.3(L) 9.6 8.4(L)  Total Protein 6.5 - 8.1 g/dL 6.4(L) 6.4(L) 6.4(L)  Total Bilirubin 0.3 - 1.2 mg/dL 0.3 0.4 0.5  Alkaline Phos 38 - 126 U/L 107 111 147(H)  AST 15 - 41 U/L 10(L) 16 22  ALT 0 - 44 U/L _1 Lab Results  Component Value Date   LDH 192 03/10/2020    DIAGNOSTIC IMAGING:  I have independently reviewed the scans and discussed with the patient. No results found.   ASSESSMENT:  1. 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 -CTAP on 01/07/2020 shows large heterogeneous cystic and solid pelvic mass. Grossly stable 2.9  cm left adrenal mass/adenoma. Indeterminate 11 mm right adrenal nodule. Small bilateral renal cysts. -TAH and BSO on 01/20/2020 shows high-grade carcinosarcoma with tumor invading through myometrium and involves the serosa. -MMR was normal. -CT chest on 03/02/2020 showed several pulmonary nodules in both lungs measuring up to 1 cm, new from 07/28/2016 suspicious for metastatic disease. -Vaginal brachytherapy was put on hold based on CT scan of the chest findings showing metastatic disease. -Carboplatin and paclitaxel cycle 1 started on 03/02/2020.   PLAN:  1. Stage III high-grade carcinosarcoma of the uterus: -She has tolerated  cycle 2 very well.  I have reviewed her labs.  LFTs are normal.  Albumin is low at 3.  Encouraged high-protein foods. -Hemoglobin is 7.3.  I will arrange for blood transfusion. -She will proceed with her cycle 3 today without any dose modifications.  I plan to see her back in 3 weeks for follow-up.  I plan to repeat CT CAP prior to next visit along with CA-125 level.  2. Diabetes: -Continue Metformin and Ozempic.  3. Hypothyroidism: -Continue Synthroid.  4. Hypertension: -Continue chlorthalidone and Lopressor.  5. Hypomagnesemia: -This has been a persistent problem.  She will receive IV magnesium as her magnesium is 1.0 today.  We will increase her home magnesium to 1 tablet twice daily.   Orders placed this encounter:  Orders Placed This Encounter  Procedures  . CT Chest W Contrast  . CT Abdomen Pelvis W Contrast  . CBC with Differential  . Comprehensive metabolic panel  . Magnesium  . CA Preston, MD [facility] [phone]   I, Milinda Antis, am acting as a scribe for Dr. Sanda Linger.  I, Derek Jack MD, have reviewed the above documentation for accuracy and completeness, and I agree with the above.

## 2020-04-13 NOTE — Patient Instructions (Signed)
[place] at [facility] Discharge Instructions  You were seen today by Dr. Delton Coombes. He went over your recent results. You received your treatment today; a blood infusion will be scheduled for tomorrow. Buy Claritin over the counter and take 1 tablet the day before your injection, the day of, and the day after your injection. Start taking 2 tablets of magnesium twice daily. You will be scheduled for a CT scan of your chest and abdomen. Dr. Delton Coombes will see you back in 3 weeks for labs and follow up.   Thank you for choosing [place] at [facility] to provide your oncology and hematology care.  To afford each patient quality time with our provider, please arrive at least 15 minutes before your scheduled appointment time.   If you have a lab appointment with the [facility] please come in thru the Main Entrance and check in at the main information desk  You need to re-schedule your appointment should you arrive 10 or more minutes late.  We strive to give you quality time with our providers, and arriving late affects you and other patients whose appointments are after yours.  Also, if you no show three or more times for appointments you may be dismissed from the clinic at the providers discretion.     Again, thank you for choosing [facility].  Our hope is that these requests will decrease the amount of time that you wait before being seen by our physicians.       _____________________________________________________________  Should you have questions after your visit to [facility], please contact our office at [phone] between the hours of 8:00 a.m. and 4:30 p.m.  Voicemails left after 4:00 p.m. will not be returned until the following business day.  For prescription refill requests, have your pharmacy contact our office and allow 72 hours.    Cancer Center Support Programs:   > Cancer Support Group  2nd  Tuesday of the month 1pm-2pm, Journey Room

## 2020-04-13 NOTE — Progress Notes (Signed)
Patient has been assessed, vital signs and labs have been reviewed by Dr. Delton Coombes. ANC, Creatinine, LFTs, and Platelets are within treatment parameters, magnesium is low, please give 3 grams of magnesium IV  per Dr. Delton Coombes. Hgb is also low, she will require 1 unit PRBC today or tomorrow per Dr. Delton Coombes. The patient is good to proceed with treatment at this time.

## 2020-04-14 ENCOUNTER — Inpatient Hospital Stay (HOSPITAL_COMMUNITY): Payer: Medicare HMO

## 2020-04-14 DIAGNOSIS — C541 Malignant neoplasm of endometrium: Secondary | ICD-10-CM

## 2020-04-14 DIAGNOSIS — C549 Malignant neoplasm of corpus uteri, unspecified: Secondary | ICD-10-CM

## 2020-04-14 DIAGNOSIS — Z5111 Encounter for antineoplastic chemotherapy: Secondary | ICD-10-CM | POA: Diagnosis not present

## 2020-04-14 DIAGNOSIS — D649 Anemia, unspecified: Secondary | ICD-10-CM

## 2020-04-14 MED ORDER — HEPARIN SOD (PORK) LOCK FLUSH 100 UNIT/ML IV SOLN
500.0000 [IU] | Freq: Every day | INTRAVENOUS | Status: AC | PRN
  Administered 2020-04-14: 500 [IU]

## 2020-04-14 MED ORDER — SODIUM CHLORIDE 0.9% FLUSH: 10.0000 mL | INTRAVENOUS | Status: DC | PRN

## 2020-04-14 MED ORDER — SODIUM CHLORIDE 0.9% IV SOLUTION
250.0000 mL | Freq: Once | INTRAVENOUS | Status: AC
  Administered 2020-04-14: 250 mL via INTRAVENOUS

## 2020-04-14 MED ORDER — ACETAMINOPHEN 325 MG PO TABS
650.0000 mg | ORAL_TABLET | Freq: Once | ORAL | Status: AC
  Administered 2020-04-14: 650 mg via ORAL
  Filled 2020-04-14: qty 2

## 2020-04-14 MED ORDER — DIPHENHYDRAMINE HCL 25 MG PO CAPS
25.0000 mg | ORAL_CAPSULE | Freq: Once | ORAL | Status: AC
  Administered 2020-04-14: 25 mg via ORAL
  Filled 2020-04-14: qty 1

## 2020-04-14 NOTE — Progress Notes (Signed)
One unit of blood given per orders. Patient tolerated it well without problems. Vitals stable and discharged home from clinic ambulatory. Follow up as scheduled.  

## 2020-04-15 ENCOUNTER — Other Ambulatory Visit: Payer: Self-pay

## 2020-04-15 ENCOUNTER — Inpatient Hospital Stay (HOSPITAL_COMMUNITY): Payer: Medicare HMO

## 2020-04-15 ENCOUNTER — Encounter (HOSPITAL_COMMUNITY): Payer: Self-pay

## 2020-04-15 VITALS — BP 151/77 | HR 60 | Temp 98.1°F | Resp 18

## 2020-04-15 DIAGNOSIS — C549 Malignant neoplasm of corpus uteri, unspecified: Secondary | ICD-10-CM

## 2020-04-15 DIAGNOSIS — C541 Malignant neoplasm of endometrium: Secondary | ICD-10-CM

## 2020-04-15 DIAGNOSIS — Z5111 Encounter for antineoplastic chemotherapy: Secondary | ICD-10-CM | POA: Diagnosis not present

## 2020-04-15 LAB — TYPE AND SCREEN
ABO/RH(D): O POS
Antibody Screen: NEGATIVE
Unit division: 0

## 2020-04-15 LAB — BPAM RBC
Blood Product Expiration Date: 202109102359
ISSUE DATE / TIME: 202108111013
Unit Type and Rh: 5100

## 2020-04-15 MED ORDER — PEGFILGRASTIM-JMDB 6 MG/0.6ML ~~LOC~~ SOSY
6.0000 mg | PREFILLED_SYRINGE | Freq: Once | SUBCUTANEOUS | Status: AC
  Administered 2020-04-15: 6 mg via SUBCUTANEOUS

## 2020-04-16 ENCOUNTER — Ambulatory Visit (HOSPITAL_COMMUNITY): Payer: Medicare HMO

## 2020-04-16 NOTE — Progress Notes (Signed)
Nutrition Follow-up:  Patient with stage III endometrial cancer.  Patient receiving carboplatin and paclitaxel.   Spoke with patient via phone.  Patient reports good appetite.  Reports diarrhea is better.  Reports that she usually eats 2 little sausage biscuits for breakfast, no lunch and sandwich for dinner.      Medications: reviewed  Labs: reviewed  Anthropometrics:   Weight 135 lb 12.8 oz on 8/10 decrease from 140 lb on 7/1.   Weight ranging from 136-140 lb   NUTRITION DIAGNOSIS: Inadequate oral intake stable   INTERVENTION:  Encouraged mini meal/snack at noon time.  Discussed options to provide additional calories and protein.  Stressed importance of weight maintenance during treatment Patient has contact information    MONITORING, EVALUATION, GOAL: weight trends, intake   NEXT VISIT: Sept 17 phone f/u  Briseidy Spark B. Zenia Resides, [place], [place] Registered Dietitian [phone] (mobile)

## 2020-04-26 ENCOUNTER — Other Ambulatory Visit: Payer: Self-pay

## 2020-04-26 ENCOUNTER — Ambulatory Visit (HOSPITAL_COMMUNITY)
Admission: RE | Admit: 2020-04-26 | Discharge: 2020-04-26 | Disposition: A | Discharge status: Home / Self Care | Payer: Medicare HMO | Source: Ambulatory Visit | Attending: Hematology | Admitting: Hematology

## 2020-04-26 DIAGNOSIS — C541 Malignant neoplasm of endometrium: Secondary | ICD-10-CM | POA: Diagnosis present

## 2020-04-26 MED ORDER — IOHEXOL 9 MG/ML PO SOLN
ORAL | Status: AC
  Filled 2020-04-26: qty 1000

## 2020-04-26 MED ORDER — IOHEXOL 300 MG/ML  SOLN
100.0000 mL | Freq: Once | INTRAMUSCULAR | Status: AC | PRN
  Administered 2020-04-26: 100 mL via INTRAVENOUS

## 2020-05-02 ENCOUNTER — Encounter (HOSPITAL_COMMUNITY): Payer: Self-pay | Admitting: Emergency Medicine

## 2020-05-02 ENCOUNTER — Emergency Department (HOSPITAL_COMMUNITY): Payer: Medicare HMO

## 2020-05-02 ENCOUNTER — Other Ambulatory Visit: Payer: Self-pay

## 2020-05-02 ENCOUNTER — Inpatient Hospital Stay (HOSPITAL_COMMUNITY)
Admission: EM | Admit: 2020-05-02 | Discharge: 2020-05-03 | DRG: 871 | Disposition: A | Discharge status: Hospice | Payer: Medicare HMO | Attending: Family Medicine | Admitting: Family Medicine

## 2020-05-02 DIAGNOSIS — D649 Anemia, unspecified: Secondary | ICD-10-CM

## 2020-05-02 DIAGNOSIS — Z833 Family history of diabetes mellitus: Secondary | ICD-10-CM

## 2020-05-02 DIAGNOSIS — I2510 Atherosclerotic heart disease of native coronary artery without angina pectoris: Secondary | ICD-10-CM | POA: Diagnosis present

## 2020-05-02 DIAGNOSIS — C541 Malignant neoplasm of endometrium: Secondary | ICD-10-CM | POA: Diagnosis present

## 2020-05-02 DIAGNOSIS — Y929 Unspecified place or not applicable: Secondary | ICD-10-CM | POA: Diagnosis not present

## 2020-05-02 DIAGNOSIS — E118 Type 2 diabetes mellitus with unspecified complications: Secondary | ICD-10-CM | POA: Diagnosis present

## 2020-05-02 DIAGNOSIS — T451X5A Adverse effect of antineoplastic and immunosuppressive drugs, initial encounter: Secondary | ICD-10-CM | POA: Diagnosis present

## 2020-05-02 DIAGNOSIS — A4150 Gram-negative sepsis, unspecified: Principal | ICD-10-CM | POA: Diagnosis present

## 2020-05-02 DIAGNOSIS — R6520 Severe sepsis without septic shock: Secondary | ICD-10-CM | POA: Diagnosis present

## 2020-05-02 DIAGNOSIS — Z66 Do not resuscitate: Secondary | ICD-10-CM | POA: Diagnosis not present

## 2020-05-02 DIAGNOSIS — E872 Acidosis: Secondary | ICD-10-CM | POA: Diagnosis present

## 2020-05-02 DIAGNOSIS — E785 Hyperlipidemia, unspecified: Secondary | ICD-10-CM | POA: Diagnosis present

## 2020-05-02 DIAGNOSIS — Z20822 Contact with and (suspected) exposure to covid-19: Secondary | ICD-10-CM | POA: Diagnosis present

## 2020-05-02 DIAGNOSIS — R911 Solitary pulmonary nodule: Secondary | ICD-10-CM | POA: Diagnosis present

## 2020-05-02 DIAGNOSIS — I214 Non-ST elevation (NSTEMI) myocardial infarction: Secondary | ICD-10-CM | POA: Diagnosis not present

## 2020-05-02 DIAGNOSIS — E279 Disorder of adrenal gland, unspecified: Secondary | ICD-10-CM | POA: Diagnosis present

## 2020-05-02 DIAGNOSIS — Z7982 Long term (current) use of aspirin: Secondary | ICD-10-CM

## 2020-05-02 DIAGNOSIS — I252 Old myocardial infarction: Secondary | ICD-10-CM | POA: Diagnosis not present

## 2020-05-02 DIAGNOSIS — D630 Anemia in neoplastic disease: Secondary | ICD-10-CM | POA: Diagnosis present

## 2020-05-02 DIAGNOSIS — Z90722 Acquired absence of ovaries, bilateral: Secondary | ICD-10-CM | POA: Diagnosis not present

## 2020-05-02 DIAGNOSIS — N3090 Cystitis, unspecified without hematuria: Secondary | ICD-10-CM | POA: Diagnosis present

## 2020-05-02 DIAGNOSIS — Z90710 Acquired absence of both cervix and uterus: Secondary | ICD-10-CM | POA: Diagnosis not present

## 2020-05-02 DIAGNOSIS — D696 Thrombocytopenia, unspecified: Secondary | ICD-10-CM | POA: Diagnosis present

## 2020-05-02 DIAGNOSIS — Z515 Encounter for palliative care: Secondary | ICD-10-CM | POA: Diagnosis not present

## 2020-05-02 DIAGNOSIS — Z955 Presence of coronary angioplasty implant and graft: Secondary | ICD-10-CM

## 2020-05-02 DIAGNOSIS — E871 Hypo-osmolality and hyponatremia: Secondary | ICD-10-CM | POA: Diagnosis present

## 2020-05-02 DIAGNOSIS — E7800 Pure hypercholesterolemia, unspecified: Secondary | ICD-10-CM | POA: Diagnosis present

## 2020-05-02 DIAGNOSIS — A419 Sepsis, unspecified organism: Secondary | ICD-10-CM | POA: Diagnosis not present

## 2020-05-02 DIAGNOSIS — F17210 Nicotine dependence, cigarettes, uncomplicated: Secondary | ICD-10-CM | POA: Diagnosis present

## 2020-05-02 DIAGNOSIS — E039 Hypothyroidism, unspecified: Secondary | ICD-10-CM | POA: Diagnosis present

## 2020-05-02 DIAGNOSIS — Z79890 Hormone replacement therapy (postmenopausal): Secondary | ICD-10-CM

## 2020-05-02 DIAGNOSIS — I21A1 Myocardial infarction type 2: Secondary | ICD-10-CM | POA: Diagnosis present

## 2020-05-02 DIAGNOSIS — I10 Essential (primary) hypertension: Secondary | ICD-10-CM | POA: Diagnosis present

## 2020-05-02 DIAGNOSIS — D6481 Anemia due to antineoplastic chemotherapy: Secondary | ICD-10-CM | POA: Diagnosis present

## 2020-05-02 DIAGNOSIS — R9431 Abnormal electrocardiogram [ECG] [EKG]: Secondary | ICD-10-CM | POA: Diagnosis not present

## 2020-05-02 DIAGNOSIS — I447 Left bundle-branch block, unspecified: Secondary | ICD-10-CM | POA: Diagnosis present

## 2020-05-02 DIAGNOSIS — D6959 Other secondary thrombocytopenia: Secondary | ICD-10-CM | POA: Diagnosis present

## 2020-05-02 DIAGNOSIS — Z720 Tobacco use: Secondary | ICD-10-CM | POA: Diagnosis present

## 2020-05-02 DIAGNOSIS — Z794 Long term (current) use of insulin: Secondary | ICD-10-CM

## 2020-05-02 DIAGNOSIS — C7951 Secondary malignant neoplasm of bone: Secondary | ICD-10-CM | POA: Diagnosis present

## 2020-05-02 DIAGNOSIS — Z79899 Other long term (current) drug therapy: Secondary | ICD-10-CM

## 2020-05-02 DIAGNOSIS — Z8249 Family history of ischemic heart disease and other diseases of the circulatory system: Secondary | ICD-10-CM

## 2020-05-02 LAB — COMPREHENSIVE METABOLIC PANEL
ALT: 10 U/L (ref 0–44)
AST: 10 U/L — ABNORMAL LOW (ref 15–41)
Albumin: 2.9 g/dL — ABNORMAL LOW (ref 3.5–5.0)
Alkaline Phosphatase: 98 U/L (ref 38–126)
Anion gap: 10 (ref 5–15)
BUN: 18 mg/dL (ref 8–23)
CO2: 19 mmol/L — ABNORMAL LOW (ref 22–32)
Calcium: 8.2 mg/dL — ABNORMAL LOW (ref 8.9–10.3)
Chloride: 100 mmol/L (ref 98–111)
Creatinine, Ser: 0.98 mg/dL (ref 0.44–1.00)
GFR calc Af Amer: >60 mL/min (ref >60)
GFR calc non Af Amer: >60 mL/min (ref >60)
Glucose, Bld: 288 mg/dL — ABNORMAL HIGH (ref 70–99)
Potassium: 3.9 mmol/L (ref 3.5–5.1)
Sodium: 129 mmol/L — ABNORMAL LOW (ref 135–145)
Total Bilirubin: 0.5 mg/dL (ref 0.3–1.2)
Total Protein: 5.9 g/dL — ABNORMAL LOW (ref 6.5–8.1)

## 2020-05-02 LAB — CBC WITH DIFFERENTIAL/PLATELET
Abs Immature Granulocytes: 0.06 10*3/uL (ref 0.00–0.07)
Basophils Absolute: 0 10*3/uL (ref 0.0–0.1)
Basophils Relative: 0 %
Eosinophils Absolute: 0 10*3/uL (ref 0.0–0.5)
Eosinophils Relative: 0 %
HCT: 17.8 % — ABNORMAL LOW (ref 36.0–46.0)
Hemoglobin: 6 g/dL — CL (ref 12.0–15.0)
Immature Granulocytes: 1 %
Lymphocytes Relative: 11 %
Lymphs Abs: 1.2 10*3/uL (ref 0.7–4.0)
MCH: 33.1 pg (ref 26.0–34.0)
MCHC: 33.7 g/dL (ref 30.0–36.0)
MCV: 98.3 fL (ref 80.0–100.0)
Monocytes Absolute: 0.9 10*3/uL (ref 0.1–1.0)
Monocytes Relative: 7 %
Neutro Abs: 9.3 10*3/uL — ABNORMAL HIGH (ref 1.7–7.7)
Neutrophils Relative %: 81 %
Platelets: 50 10*3/uL — ABNORMAL LOW (ref 150–400)
RBC: 1.81 MIL/uL — ABNORMAL LOW (ref 3.87–5.11)
RDW: 21.4 % — ABNORMAL HIGH (ref 11.5–15.5)
WBC: 11.5 10*3/uL — ABNORMAL HIGH (ref 4.0–10.5)
nRBC: 0 % (ref 0.0–0.2)

## 2020-05-02 LAB — URINALYSIS, ROUTINE W REFLEX MICROSCOPIC
Bilirubin Urine: NEGATIVE
Glucose, UA: 150 mg/dL — AB
Ketones, ur: 5 mg/dL — AB
Nitrite: NEGATIVE
Protein, ur: >=300 mg/dL — AB
Specific Gravity, Urine: 1.016 (ref 1.005–1.030)
pH: 6 (ref 5.0–8.0)

## 2020-05-02 LAB — PROTIME-INR
INR: 1.2 (ref 0.8–1.2)
Prothrombin Time: 14.3 seconds (ref 11.4–15.2)

## 2020-05-02 LAB — CBG MONITORING, ED: Glucose-Capillary: 335 mg/dL — ABNORMAL HIGH (ref 70–99)

## 2020-05-02 LAB — SARS CORONAVIRUS 2 BY RT PCR (HOSPITAL ORDER, PERFORMED IN ~~LOC~~ HOSPITAL LAB): SARS Coronavirus 2: NEGATIVE

## 2020-05-02 LAB — APTT: aPTT: 39 seconds — ABNORMAL HIGH (ref 24–36)

## 2020-05-02 LAB — LACTIC ACID, PLASMA
Lactic Acid, Venous: 1.6 mmol/L (ref 0.5–1.9)
Lactic Acid, Venous: 3.6 mmol/L (ref 0.5–1.9)

## 2020-05-02 LAB — PREPARE RBC (CROSSMATCH)

## 2020-05-02 MED ORDER — FENTANYL CITRATE (PF) 100 MCG/2ML IJ SOLN
INTRAMUSCULAR | Status: AC
  Administered 2020-05-02: 50 ug via INTRAVENOUS
  Filled 2020-05-02: qty 2

## 2020-05-02 MED ORDER — INSULIN ASPART 100 UNIT/ML ~~LOC~~ SOLN
6.0000 [IU] | Freq: Once | SUBCUTANEOUS | Status: AC
  Administered 2020-05-02: 6 [IU] via SUBCUTANEOUS

## 2020-05-02 MED ORDER — SODIUM CHLORIDE 0.9% IV SOLUTION: Freq: Once | INTRAVENOUS | Status: DC

## 2020-05-02 MED ORDER — ONDANSETRON HCL 4 MG PO TABS: 4.0000 mg | ORAL_TABLET | Freq: Four times a day (QID) | ORAL | Status: DC | PRN

## 2020-05-02 MED ORDER — ENSURE ENLIVE PO LIQD
237.0000 mL | Freq: Two times a day (BID) | ORAL | Status: DC
  Administered 2020-05-03: 237 mL via ORAL

## 2020-05-02 MED ORDER — SODIUM CHLORIDE 0.9 % IV SOLN
1.0000 g | Freq: Once | INTRAVENOUS | Status: AC
  Administered 2020-05-02: 1 g via INTRAVENOUS
  Filled 2020-05-02: qty 10

## 2020-05-02 MED ORDER — SODIUM CHLORIDE 0.9 % IV SOLN: 2.0000 g | INTRAVENOUS | Status: DC

## 2020-05-02 MED ORDER — ACETAMINOPHEN 325 MG PO TABS
650.0000 mg | ORAL_TABLET | Freq: Once | ORAL | Status: DC
  Filled 2020-05-02: qty 2

## 2020-05-02 MED ORDER — LACTATED RINGERS IV BOLUS (SEPSIS)
1000.0000 mL | Freq: Once | INTRAVENOUS | Status: AC
  Administered 2020-05-03: 1000 mL via INTRAVENOUS

## 2020-05-02 MED ORDER — LACTATED RINGERS IV BOLUS (SEPSIS)
1000.0000 mL | Freq: Once | INTRAVENOUS | Status: AC
  Administered 2020-05-02: 1000 mL via INTRAVENOUS

## 2020-05-02 MED ORDER — ACETAMINOPHEN 650 MG RE SUPP
650.0000 mg | Freq: Once | RECTAL | Status: AC
  Administered 2020-05-02: 650 mg via RECTAL
  Filled 2020-05-02: qty 1

## 2020-05-02 MED ORDER — ONDANSETRON HCL 4 MG/2ML IJ SOLN
4.0000 mg | Freq: Four times a day (QID) | INTRAMUSCULAR | Status: DC | PRN
  Administered 2020-05-02: 4 mg via INTRAVENOUS
  Filled 2020-05-02: qty 2

## 2020-05-02 MED ORDER — ACETAMINOPHEN 650 MG RE SUPP: 650.0000 mg | Freq: Four times a day (QID) | RECTAL | Status: DC | PRN

## 2020-05-02 MED ORDER — FENTANYL CITRATE (PF) 100 MCG/2ML IJ SOLN: 50.0000 ug | Freq: Once | INTRAMUSCULAR | Status: AC

## 2020-05-02 MED ORDER — INSULIN ASPART 100 UNIT/ML ~~LOC~~ SOLN
0.0000 [IU] | Freq: Three times a day (TID) | SUBCUTANEOUS | Status: DC
  Administered 2020-05-03: 3 [IU] via SUBCUTANEOUS
  Administered 2020-05-03: 5 [IU] via SUBCUTANEOUS
  Administered 2020-05-03: 11 [IU] via SUBCUTANEOUS
  Filled 2020-05-02: qty 1

## 2020-05-02 MED ORDER — SODIUM CHLORIDE 0.9 % IV SOLN
2.0000 g | Freq: Once | INTRAVENOUS | Status: AC
  Administered 2020-05-02: 2 g via INTRAVENOUS
  Filled 2020-05-02: qty 2

## 2020-05-02 MED ORDER — ONDANSETRON HCL 4 MG/2ML IJ SOLN
4.0000 mg | Freq: Once | INTRAMUSCULAR | Status: AC
  Administered 2020-05-02: 4 mg via INTRAVENOUS
  Filled 2020-05-02: qty 2

## 2020-05-02 MED ORDER — ACETAMINOPHEN 325 MG PO TABS
650.0000 mg | ORAL_TABLET | Freq: Four times a day (QID) | ORAL | Status: DC | PRN
  Filled 2020-05-02: qty 2

## 2020-05-02 NOTE — H&P (Signed)
History and Physical    HOANG REICH DEY:[id number] DOB: 1951/01/09 DOA: 05/02/2020  PCP: Denyce Robert, FNP   Patient coming from: Home.  I have personally briefly reviewed patient's old medical records in [place]  Chief Complaint: Abdominal pain and weakness.  HPI: Lisa Jenkins is a 69 y.o. female with medical history significant of osteoarthritis, CAD, history of NSTEMI in 2018, type 2 diabetes, hyperlipidemia, hyperkalemia, hypertension, hypothyroidism, history of tobacco use, history of retinal hemorrhage, vitamin D deficiency, ovarian cancer with 2 recent cycles of chemotherapy who is coming to the emergency department with complaints of abdominal pain associated with dyspnea, LE weakness for the past 2 days, chills, fatigue and decreased appetite.  She denies fever, but was febrile here.  No rhinorrhea, sore throat, wheezing or hemoptysis.  She denies nausea, emesis, diarrhea, constipation, melena or hematochezia.  No dysuria, frequency or hematuria.  No polyuria, polydipsia, polyphagia or blurred vision.  ED Course: Initial vital signs were temperature 100.2 F, pulse 80, respirations 22, BP 122/56 mmHg and O2 sat 100% on room air.  She received a 1000 mL LR bolus, ondansetron 4 mg IVP and ceftriaxone 2 g IVPB.  I order fentanyl 50 mcg and famotidine 20 mg IVP in the emergency department.  The patient was subsequently tachycardic in the 130s and developed precordial chest pain after arriving to the floor.  She was treated with supplemental oxygen, IV fluids, morphine, antiemetic and low-dose beta-blocker.  Cardiology on-call was contacted who deferred full dose antiplatelet therapy and heparin due to thrombocytopenia.  Urinalysis shows glucosuria 150, ketonuria 5 and proteinuria Khtari 100 mg/dL.  Small leukocyte esterase, 21-50 WBC per hpf with many bacteria.  CBC showed a white count 11.5, hemoglobin 6.0 g/dL and platelets 50.  PT 14.3, INR 1.2 PTT 39.  Initial lactic acid was  1.6 and follow-up was 3.6 mmol/L.  CMP sodium 129, potassium 3.9, chloride 100 and CO2 19 mmol/L.  Anion gap was normal.  Renal function was normal.  Glucose 288 and calcium 8.2 mg/dL.  Total protein was 5.9 and albumin 2.9 g/dL, the rest of the hepatic functions are unremarkable.  Magnesium levels 1.1 mg/dL.  Review of Systems: As per HPI otherwise all other systems reviewed and are negative.  Past Medical History:  Diagnosis Date  . Arthritis   . Cancer ([place])   . Coronary artery disease    a. s/p NSTEMI in 07/2017 with DES to mid-RCA and residual disease along proximal-mid LAD  . Diabetes mellitus without complication ([place])   . High cholesterol   . Hyperkalemia   . Hypertension   . Myocardial infarction ([place])   . Vitamin D deficiency    Past Surgical History:  Procedure Laterality Date  . ABDOMINAL HYSTERECTOMY    . Bilateral oopherectomy    . BUNIONECTOMY Bilateral   . CORONARY STENT INTERVENTION N/A 07/09/2017   Procedure: CORONARY STENT INTERVENTION;  Surgeon: Belva Crome, MD;  Location: [facility];  Service: Cardiovascular;  Laterality: N/A;  . LEFT HEART CATH AND CORONARY ANGIOGRAPHY N/A 07/09/2017   Procedure: LEFT HEART CATH AND CORONARY ANGIOGRAPHY;  Surgeon: Belva Crome, MD;  Location: [facility];  Service: Cardiovascular;  Laterality: N/A;  . PORTACATH PLACEMENT Left 03/01/2020   Procedure: INSERTION PORT-A-CATH;  Surgeon: Aviva Signs, MD;  Location: [facility];  Service: General;  Laterality: Left;  . ROBOTIC ASSISTED TOTAL HYSTERECTOMY WITH BILATERAL SALPINGO OOPHERECTOMY Bilateral 01/20/2020   Procedure: XI ROBOTIC ASSISTED TOTAL HYSTERECTOMY, UTERUS  GREATER THAN 250 GRAMS WITH BILATERAL SALPINGO OOPHORECTOMY, MINI LAPAROTOMY;  Surgeon: Everitt Amber, MD;  Location: [facility];  Service: Gynecology;  Laterality: Bilateral;   Social History  reports that she has been smoking cigarettes. She has a 23.00 pack-year smoking history. She has never used smokeless  tobacco. She reports previous alcohol use. She reports that she does not use drugs.  No Known Allergies  Family History  Problem Relation Age of Onset  . Diabetes Mother   . Heart disease Mother   . Diabetes Sister   . Heart disease Sister   . Diabetes Brother   . Heart disease Brother   . Diabetes Brother   . Heart disease Brother   . Diabetes Brother   . Heart disease Brother   . Diabetes Brother   . Heart disease Brother   . Diabetes Brother   . Heart disease Brother   . Breast cancer Neg Hx   . Ovarian cancer Neg Hx   . Endometrial cancer Neg Hx   . Colon cancer Neg Hx    Prior to Admission medications   Medication Sig Start Date End Date Taking? Authorizing Provider  acetaminophen (TYLENOL) 325 MG tablet Take 2 tablets (650 mg total) by mouth every 6 (six) hours. 01/12/20   Arrien, Jimmy Picket, MD  allopurinol (ZYLOPRIM) 100 MG tablet Take 1 tablet (100 mg total) by mouth daily. 01/12/20 02/23/21  Arrien, Jimmy Picket, MD  aspirin 81 MG chewable tablet Chew 81 mg by mouth daily.     [provider]  atorvastatin (LIPITOR) 80 MG tablet TAKE 1 Tablet BY MOUTH ONCE DAILY AT 6:00PM Patient taking differently: Take 80 mg by mouth daily at 6 PM. TAKE 1 Tablet BY MOUTH ONCE DAILY AT 6:00PM 02/05/18   Soyla Dryer, PA-C  CARBOPLATIN IV Inject into the vein every 21 ( twenty-one) days. 03/02/20   [provider]  chlorthalidone (HYGROTON) 25 MG tablet Take 1 tablet by mouth once daily 02/26/20   Herminio Commons, MD  hydrOXYzine (ATARAX/VISTARIL) 25 MG tablet  03/16/20   [provider]  insulin degludec (TRESIBA FLEXTOUCH) 100 UNIT/ML FlexTouch Pen Inject 20 Units into the skin daily.    [provider]  lidocaine-prilocaine (EMLA) cream Apply small amount to port a cath site (do not rub in) and cover with plastic wrap 1 hour prior to chemotherapy appointments Patient not taking: Reported on 04/13/2020 02/18/20   Derek Jack, MD    magnesium oxide (MAG-OX) 400 (241.3 Mg) MG tablet Take 1 tablet (400 mg total) by mouth 2 (two) times daily. 03/10/20   Derek Jack, MD  metFORMIN (GLUCOPHAGE) 1000 MG tablet TAKE 1 Tablet  BY MOUTH TWICE DAILY WITH MEALS Patient taking differently: Take 1,000 mg by mouth 2 (two) times daily with a meal.  04/28/18   Soyla Dryer, PA-C  metoprolol tartrate (LOPRESSOR) 25 MG tablet TAKE 1 Tablet  BY MOUTH TWICE DAILY Patient taking differently: Take 25 mg by mouth 2 (two) times daily.  04/28/18   Soyla Dryer, PA-C  OZEMPIC, 1 MG/DOSE, 2 MG/1.5ML SOPN Inject 1 mg into the skin every Thursday. 12/18/19   [provider]  PACLITAXEL IV Inject into the vein every 21 ( twenty-one) days. 03/02/20   [provider]  prochlorperazine (COMPAZINE) 10 MG tablet Take 1 tablet (10 mg total) by mouth every 6 (six) hours as needed (Nausea or vomiting). Patient not taking: Reported on 04/13/2020 03/02/20   Derek Jack, MD  senna-docusate (SENOKOT-S) 8.6-50 MG tablet  Take 2 tablets by mouth at bedtime. Take to prevent constipation, do not take if having diarrhea 01/21/20   Cross, Lenna Sciara D, NP  SYNTHROID 25 MCG tablet TAKE 1 Tablet BY MOUTH ONCE DAILY BEFORE BREAKFAST Patient taking differently: Take 25 mcg by mouth daily before breakfast.  01/04/18   Soyla Dryer, PA-C  traMADol (ULTRAM) 50 MG tablet Take 1 tablet (50 mg total) by mouth every 6 (six) hours as needed for severe pain. Do not take and drive Patient not taking: Reported on 04/13/2020 03/01/20   Aviva Signs, MD  Vitamin D, Ergocalciferol, (DRISDOL) 1.25 MG (50000 UNIT) CAPS capsule Take 50,000 Units by mouth every Tuesday.  12/18/19   [provider]    Physical Exam: Vitals:   05/02/20 1930 05/02/20 2000 05/02/20 2030 05/02/20 2100  BP: (!) 130/55 128/84 (!) 113/103 (!) 141/114  Pulse: 80 85 99 (!) 132  Resp: 20 (!) 28 (!) 28 19  Temp:      TempSrc:      SpO2: 99% 100% 100% 100%  Weight:       Height:        Constitutional: Looks chronically ill. Eyes: PERRL, lids and conjunctivae mildly injected. ENMT: Mucous membranes are dry.  Posterior pharynx clear of any exudate or lesions.  Neck: normal, supple, no masses, no thyromegaly Respiratory: clear to auscultation bilaterally, no wheezing, no crackles. Normal respiratory effort. No accessory muscle use.  Cardiovascular: Tachycardic in the 110s., no murmurs / rubs / gallops. No extremity edema. 2+ pedal pulses. No carotid bruits.  Abdomen: Nondistended.  BS positive.  Soft, no tenderness, no masses palpated. No hepatosplenomegaly. Bowel sounds positive.  Musculoskeletal: no clubbing / cyanosis. Good ROM, no contractures. Normal muscle tone.  Skin: Areas of ecchymosis on extremities. Neurologic: CN 2-12 grossly intact. Sensation intact, DTR normal. Strength 5/5 in all 4.  Psychiatric: Normal judgment and insight. Alert and oriented x 3. Normal mood.   Labs on Admission: I have personally reviewed following labs and imaging studies  CBC: Recent Labs  Lab 05/02/20 1932  WBC 11.5*  NEUTROABS 9.3*  HGB 6.0*  HCT 17.8*  MCV 98.3  PLT 50*    Basic Metabolic Panel: Recent Labs  Lab 05/02/20 1932  NA 129*  K 3.9  CL 100  CO2 19*  GLUCOSE 288*  BUN 18  CREATININE 0.98  CALCIUM 8.2*    GFR: Estimated Creatinine Clearance: 50.6 mL/min (by C-G formula based on SCr of 0.98 mg/dL).  Liver Function Tests: Recent Labs  Lab 05/02/20 1932  AST 10*  ALT 10  ALKPHOS 98  BILITOT 0.5  PROT 5.9*  ALBUMIN 2.9*    Urine analysis:    Component Value Date/Time   COLORURINE YELLOW 05/02/2020 1926   APPEARANCEUR HAZY (A) 05/02/2020 1926   LABSPEC 1.016 05/02/2020 1926   PHURINE 6.0 05/02/2020 1926   GLUCOSEU 150 (A) 05/02/2020 1926   HGBUR SMALL (A) 05/02/2020 1926   BILIRUBINUR NEGATIVE 05/02/2020 1926   KETONESUR 5 (A) 05/02/2020 1926   PROTEINUR >=300 (A) 05/02/2020 1926   NITRITE NEGATIVE 05/02/2020 1926    LEUKOCYTESUR SMALL (A) 05/02/2020 1926    Radiological Exams on Admission: DG Chest Port 1 View  Result Date: 05/02/2020 CLINICAL DATA:  Per triage note; Pt c/o of abdominal pain and weakness x 2 days. Recent hysterectomy and being treated for ovarian cancer EXAM: PORTABLE CHEST 1 VIEW COMPARISON:  Chest radiograph 03/01/2020 FINDINGS: Left chest port in place with catheter tip projecting in the upper SVC.  Stable cardiomediastinal contours. Chronic coarse interstitial markings. No new focal opacity. No pneumothorax or pleural effusion. No acute finding in the visualized skeleton. IMPRESSION: No acute cardiopulmonary process. Electronically Signed   By: Audie Pinto M.D.   On: 05/02/2020 20:29    EKG: Independently reviewed.  Vent. rate 82 BPM PR interval * ms QRS duration 110 ms QT/QTc 358/419 ms P-R-T axes -32 -9 28 Sinus rhythm Incomplete left bundle branch block Anterior Q waves, possibly due to ILBBB since last tracing no significant change  Assessment/Plan Principal Problem:   Sepsis due to undetermined organism ([place]) Admit to stepdown/inpatient. Continue supplemental oxygen. Continue IV fluids. Continue ceftriaxone 2 g IVPB every 24 hours. Follow-up blood culture and sensitivity. Follow-up urine culture and sensitivity.  Active Problems:   Symptomatic anemia Worsened by recent chemotherapy. A 1 unit PRBC transfusion was ordered. Monitor hematocrit and hemoglobin.    Elevated troponin/chest pain Likely demand ischemia per cardiology. Trend troponin levels. Thrombocytopenic with platelets in the 50s. No heparin or full antiplatelet Rx per cardiology on-call Check echocardiogram. Routine cardiology later today.    Hyponatremia Received IVF. Follow-up sodium level.    Hypomagnesemia Replacement ordered. Continue magnesium oxide twice daily.    CAD (coronary artery disease) On low-dose aspirin, atorvastatin and metoprolol.    Hyperlipidemia Continue  atorvastatin 80 mg p.o. daily.    Type 2 diabetes mellitus with complication (HCC) Carbohydrate modified diet. Continue insulin degludec once dose confirmed. CBG monitoring with RI SS. Metformin held due to lactic acidosis.    Hypothyroidism Continue levothyroxine 25 mcg p.o. daily.    Essential hypertension Continue metoprolol 25 mg p.o. twice daily.    Tobacco use Stopped 2 years ago per patient.   DVT prophylaxis: SCDs. Code Status:   Full code. Family Communication:   Disposition Plan:   Patient is from:  Home.  Anticipated DC to:  Home.  Anticipated DC date:  06/01/2020.  Anticipated DC barriers: Clinical improvement.  Consults called:  On call cardiology (Dr. Rodman Key) suggested onsite cardiology evaluation. Admission status:  Inpatient/stepdown.    Severity of Illness:Very high due to sepsis on a patient who had a recent chemotherapy, now also having chest pressure and elevated troponin.  Reubin Milan MD [facility]  How to contact the [facility] Attending or Consulting provider [place] or covering provider during after hours [place], for this patient?   1. Check the care team in [facility] and look for a) attending/consulting [facility] provider listed and b) the [facility] team listed 2. Log into [email or web address] and use [place]'s universal password to access. If you do not have the password, please contact the hospital operator. 3. Locate the [facility] provider you are looking for under [facility] and page to a number that you can be directly reached. 4. If you still have difficulty reaching the provider, please page the [facility] (Director on Call) for the Hospitalists listed on amion for assistance.  05/02/2020, 9:40 PM   This document was prepared using Dragon voice recognition software and may contain some unintended transcription errors.

## 2020-05-02 NOTE — ED Triage Notes (Signed)
Pt c/o of abdominal pain and weakness x 2 days. Recent hysterectomy and being treated for ovarian cancer

## 2020-05-02 NOTE — ED Provider Notes (Signed)
[facility] EMERGENCY DEPARTMENT Provider Note   CSN: [id number] Arrival date & time: 05/02/20  1819     History Chief Complaint  Patient presents with  . Weakness    Lisa Jenkins is a 69 y.o. female.  HPI   This patient is a 69 year old female with a known history of uterine cancer, coronary disease status post stenting, diabetes, hypertension.  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  She is scheduled to have another infusion within the next couple of days.  She presents to the hospital today with a complaint of increasing weakness of her bilateral legs as well as pain in her legs, the patient is visibly tachypneic but does not have any complaints of chest pain or shortness of breath or coughing.  She was unaware that she had fevers but states that she has had some shaking chills and feeling cold throughout the day.  She has a port in her left upper chest.  Her symptoms are persistent, gradually worsening, nothing seems to make this better or worse.  It is not associated with nausea vomiting or diarrhea.  She has no rashes to the skin.  Past Medical History:  Diagnosis Date  . Arthritis   . Cancer ([place])   . Coronary artery disease    a. s/p NSTEMI in 07/2017 with DES to mid-RCA and residual disease along proximal-mid LAD  . Diabetes mellitus without complication ([place])   . High cholesterol   . Hyperkalemia   . Hypertension   . Myocardial infarction ([place])   . Vitamin D deficiency     Patient Active Problem List   Diagnosis Date Noted  . Endometrial cancer ([place]) 02/11/2020  . Pelvic mass in female 01/20/2020  . Uterine sarcoma ([place]) 01/20/2020  . Preoperative clearance   . Coronary artery disease of native artery of native heart with stable angina pectoris ([place])   . Essential hypertension   . Hyperlipidemia LDL goal <70   . Tobacco use   .  History of coronary artery stent placement   . Leukocytosis 01/10/2020  . AKI (acute kidney injury) ([place]) 01/10/2020  . Hyperkalemia 01/08/2020  . Pelvic mass 01/08/2020  . NSTEMI (non-ST elevated myocardial infarction) ([place]) 07/08/2017  . Chest pain 07/28/2016  . Adrenal hemorrhage ([place]) 07/28/2016  . Hypothyroidism 10/21/2015  . Uncontrolled type 2 diabetes mellitus with complication ([place]) 54/00/8676  . Type 2 diabetes mellitus with complication ([place]) 19/50/9326  . Hyperlipidemia 07/21/2015  . Essential hypertension, benign 07/21/2015  . Cigarette nicotine dependence, uncomplicated 71/24/5809  . Type 2 diabetes mellitus with retinopathy ([place]) 07/21/2015  . Proteinuria 07/21/2015  . Thyroid activity decreased 07/21/2015    Past Surgical History:  Procedure Laterality Date  . ABDOMINAL HYSTERECTOMY    . Bilateral oopherectomy    . BUNIONECTOMY Bilateral   . CORONARY STENT INTERVENTION N/A 07/09/2017   Procedure: CORONARY STENT INTERVENTION;  Surgeon: Belva Crome, MD;  Location: [place] [facility];  Service: Cardiovascular;  Laterality: N/A;  . LEFT HEART CATH AND CORONARY ANGIOGRAPHY N/A 07/09/2017   Procedure: LEFT HEART CATH AND CORONARY ANGIOGRAPHY;  Surgeon: Belva Crome, MD;  Location: [place] [facility];  Service: Cardiovascular;  Laterality: N/A;  . PORTACATH PLACEMENT Left 03/01/2020   Procedure: INSERTION PORT-A-CATH;  Surgeon: Aviva Signs, MD;  Location: [facility];  Service: General;  Laterality: Left;  . ROBOTIC  ASSISTED TOTAL HYSTERECTOMY WITH BILATERAL SALPINGO OOPHERECTOMY Bilateral 01/20/2020   Procedure: XI ROBOTIC ASSISTED TOTAL HYSTERECTOMY, UTERUS GREATER THAN 250 GRAMS WITH BILATERAL SALPINGO OOPHORECTOMY, MINI LAPAROTOMY;  Surgeon: Everitt Amber, MD;  Location: [facility];  Service: Gynecology;  Laterality: Bilateral;     OB History    Gravida      Para      Term      Preterm      AB      Living  0     SAB      TAB      Ectopic      Multiple       Live Births              Family History  Problem Relation Age of Onset  . Diabetes Mother   . Heart disease Mother   . Diabetes Sister   . Heart disease Sister   . Diabetes Brother   . Heart disease Brother   . Diabetes Brother   . Heart disease Brother   . Diabetes Brother   . Heart disease Brother   . Diabetes Brother   . Heart disease Brother   . Diabetes Brother   . Heart disease Brother   . Breast cancer Neg Hx   . Ovarian cancer Neg Hx   . Endometrial cancer Neg Hx   . Colon cancer Neg Hx     Social History   Tobacco Use  . Smoking status: Current Every Day Smoker    Packs/day: 0.50    Years: 46.00    Pack years: 23.00    Types: Cigarettes  . Smokeless tobacco: Never Used  Vaping Use  . Vaping Use: Never used  Substance Use Topics  . Alcohol use: Not Currently    Alcohol/week: 0.0 standard drinks  . Drug use: No    Home Medications Prior to Admission medications   Medication Sig Start Date End Date Taking? Authorizing Provider  acetaminophen (TYLENOL) 325 MG tablet Take 2 tablets (650 mg total) by mouth every 6 (six) hours. 01/12/20   Arrien, Jimmy Picket, MD  allopurinol (ZYLOPRIM) 100 MG tablet Take 1 tablet (100 mg total) by mouth daily. 01/12/20 02/23/21  Arrien, Jimmy Picket, MD  aspirin 81 MG chewable tablet Chew 81 mg by mouth daily.     [provider]  atorvastatin (LIPITOR) 80 MG tablet TAKE 1 Tablet BY MOUTH ONCE DAILY AT 6:00PM Patient taking differently: Take 80 mg by mouth daily at 6 PM. TAKE 1 Tablet BY MOUTH ONCE DAILY AT 6:00PM 02/05/18   Soyla Dryer, PA-C  CARBOPLATIN IV Inject into the vein every 21 ( twenty-one) days. 03/02/20   [provider]  chlorthalidone (HYGROTON) 25 MG tablet Take 1 tablet by mouth once daily 02/26/20   Herminio Commons, MD  hydrOXYzine (ATARAX/VISTARIL) 25 MG tablet  03/16/20   [provider]  insulin degludec (TRESIBA FLEXTOUCH) 100 UNIT/ML FlexTouch Pen Inject 20 Units  into the skin daily.    [provider]  lidocaine-prilocaine (EMLA) cream Apply small amount to port a cath site (do not rub in) and cover with plastic wrap 1 hour prior to chemotherapy appointments Patient not taking: Reported on 04/13/2020 02/18/20   Derek Jack, MD  magnesium oxide (MAG-OX) 400 (241.3 Mg) MG tablet Take 1 tablet (400 mg total) by mouth 2 (two) times daily. 03/10/20   Derek Jack, MD  metFORMIN (GLUCOPHAGE) 1000 MG tablet TAKE 1 Tablet  BY MOUTH TWICE DAILY WITH  MEALS Patient taking differently: Take 1,000 mg by mouth 2 (two) times daily with a meal.  04/28/18   Soyla Dryer, PA-C  metoprolol tartrate (LOPRESSOR) 25 MG tablet TAKE 1 Tablet  BY MOUTH TWICE DAILY Patient taking differently: Take 25 mg by mouth 2 (two) times daily.  04/28/18   Soyla Dryer, PA-C  OZEMPIC, 1 MG/DOSE, 2 MG/1.5ML SOPN Inject 1 mg into the skin every Thursday. 12/18/19   [provider]  PACLITAXEL IV Inject into the vein every 21 ( twenty-one) days. 03/02/20   [provider]  prochlorperazine (COMPAZINE) 10 MG tablet Take 1 tablet (10 mg total) by mouth every 6 (six) hours as needed (Nausea or vomiting). Patient not taking: Reported on 04/13/2020 03/02/20   Derek Jack, MD  senna-docusate (SENOKOT-S) 8.6-50 MG tablet Take 2 tablets by mouth at bedtime. Take to prevent constipation, do not take if having diarrhea 01/21/20   Cross, Lenna Sciara D, NP  SYNTHROID 25 MCG tablet TAKE 1 Tablet BY MOUTH ONCE DAILY BEFORE BREAKFAST Patient taking differently: Take 25 mcg by mouth daily before breakfast.  01/04/18   Soyla Dryer, PA-C  traMADol (ULTRAM) 50 MG tablet Take 1 tablet (50 mg total) by mouth every 6 (six) hours as needed for severe pain. Do not take and drive Patient not taking: Reported on 04/13/2020 03/01/20   Aviva Signs, MD  Vitamin D, Ergocalciferol, (DRISDOL) 1.25 MG (50000 UNIT) CAPS capsule Take 50,000 Units by mouth every Tuesday.  12/18/19    [provider]    Allergies    Patient has no known allergies.  Review of Systems   Review of Systems  All other systems reviewed and are negative.   Physical Exam Updated Vital Signs BP (!) 122/56   Pulse 80   Temp 100.2 F (37.9 C)   Resp (!) 22   Ht 1.6 m (5\' 3" )   Wt 63.5 kg   SpO2 100%   BMI 24.80 kg/m   Physical Exam Vitals and nursing note reviewed.  Constitutional:      General: She is not in acute distress.    Appearance: She is well-developed.  HENT:     Head: Normocephalic and atraumatic.     Mouth/Throat:     Pharynx: No oropharyngeal exudate.  Eyes:     General: No scleral icterus.       Right eye: No discharge.        Left eye: No discharge.     Conjunctiva/sclera: Conjunctivae normal.     Pupils: Pupils are equal, round, and reactive to light.  Neck:     Thyroid: No thyromegaly.     Vascular: No JVD.  Cardiovascular:     Rate and Rhythm: Normal rate and regular rhythm.     Heart sounds: Normal heart sounds. No murmur heard.  No friction rub. No gallop.   Pulmonary:     Effort: No respiratory distress.     Breath sounds: Normal breath sounds. No wheezing or rales.     Comments: Tachypneic, mouth breathing, speaks in full sentences, no abnormal lung sounds Abdominal:     General: Bowel sounds are normal. There is no distension.     Palpations: Abdomen is soft. There is no mass.     Tenderness: There is no abdominal tenderness.  Musculoskeletal:        General: No tenderness. Normal range of motion.     Cervical back: Normal range of motion and neck supple.     Comments: Despite the patient  having leg pain she has normal-appearing legs without bruising swelling edema or deformity, the compartments are soft and the joints are supple bilaterally  Lymphadenopathy:     Cervical: No cervical adenopathy.  Skin:    General: Skin is warm and dry.     Findings: No erythema or rash.  Neurological:     General: No focal deficit present.      Mental Status: She is alert.     Coordination: Coordination normal.  Psychiatric:        Behavior: Behavior normal.     ED Results / Procedures / Treatments   Labs (all labs ordered are listed, but only abnormal results are displayed) Labs Reviewed  COMPREHENSIVE METABOLIC PANEL - Abnormal; Notable for the following components:      Result Value   Sodium 129 (*)    CO2 19 (*)    Glucose, Bld 288 (*)    Calcium 8.2 (*)    Total Protein 5.9 (*)    Albumin 2.9 (*)    AST 10 (*)    All other components within normal limits  CBC WITH DIFFERENTIAL/PLATELET - Abnormal; Notable for the following components:   WBC 11.5 (*)    RBC 1.81 (*)    Hemoglobin 6.0 (*)    HCT 17.8 (*)    RDW 21.4 (*)    Platelets 50 (*)    Neutro Abs 9.3 (*)    All other components within normal limits  APTT - Abnormal; Notable for the following components:   aPTT 39 (*)    All other components within normal limits  URINALYSIS, ROUTINE W REFLEX MICROSCOPIC - Abnormal; Notable for the following components:   APPearance HAZY (*)    Glucose, UA 150 (*)    Hgb urine dipstick SMALL (*)    Ketones, ur 5 (*)    Protein, ur >=300 (*)    Leukocytes,Ua SMALL (*)    Bacteria, UA MANY (*)    All other components within normal limits  CBG MONITORING, ED - Abnormal; Notable for the following components:   Glucose-Capillary 335 (*)    All other components within normal limits  SARS CORONAVIRUS 2 BY RT PCR (HOSPITAL ORDER, [place] [facility])  CULTURE, BLOOD (SINGLE)  URINE CULTURE  LACTIC ACID, PLASMA  PROTIME-INR  LACTIC ACID, PLASMA  HIV ANTIBODY (ROUTINE TESTING W REFLEX)  CBC WITH DIFFERENTIAL/PLATELET  HEMOGLOBIN A1C  TYPE AND SCREEN  PREPARE RBC (CROSSMATCH)    EKG EKG Interpretation  Date/Time:  Sunday May 02 2020 18:26:43 EDT Ventricular Rate:  82 PR Interval:    QRS Duration: 110 QT Interval:  358 QTC Calculation: 419 R Axis:   -9 Text Interpretation: Sinus rhythm  Incomplete left bundle branch block Anterior Q waves, possibly due to ILBBB since last tracing no significant change Confirmed by Noemi Chapel [phone]) on 05/02/2020 6:32:52 PM   Radiology DG Chest Port 1 View  Result Date: 05/02/2020 CLINICAL DATA:  Per triage note; Pt c/o of abdominal pain and weakness x 2 days. Recent hysterectomy and being treated for ovarian cancer EXAM: PORTABLE CHEST 1 VIEW COMPARISON:  Chest radiograph 03/01/2020 FINDINGS: Left chest port in place with catheter tip projecting in the upper SVC. Stable cardiomediastinal contours. Chronic coarse interstitial markings. No new focal opacity. No pneumothorax or pleural effusion. No acute finding in the visualized skeleton. IMPRESSION: No acute cardiopulmonary process. Electronically Signed   By: Audie Pinto M.D.   On: 05/02/2020 20:29    Procedures .Critical  Care Performed by: Noemi Chapel, MD Authorized by: Noemi Chapel, MD   Critical care provider statement:    Critical care time (minutes):  35   Critical care time was exclusive of:  Separately billable procedures and treating other patients and teaching time   Critical care was necessary to treat or prevent imminent or life-threatening deterioration of the following conditions:  Sepsis (severe anemia)   Critical care was time spent personally by me on the following activities:  Blood draw for specimens, development of treatment plan with patient or surrogate, discussions with consultants, evaluation of patient's response to treatment, examination of patient, obtaining history from patient or surrogate, ordering and performing treatments and interventions, ordering and review of laboratory studies, ordering and review of radiographic studies, pulse oximetry, re-evaluation of patient's condition and review of old charts   (including critical care time)  Medications Ordered in ED Medications  cefTRIAXone (ROCEPHIN) 1 g in sodium chloride 0.9 % 100 mL IVPB (1 g  Intravenous New Bag/Given 05/02/20 2123)  acetaminophen (TYLENOL) tablet 650 mg (has no administration in time range)    Or  acetaminophen (TYLENOL) suppository 650 mg (has no administration in time range)  ondansetron (ZOFRAN) tablet 4 mg (has no administration in time range)    Or  ondansetron (ZOFRAN) injection 4 mg (has no administration in time range)  cefTRIAXone (ROCEPHIN) 2 g in sodium chloride 0.9 % 100 mL IVPB (has no administration in time range)  lactated ringers bolus 1,000 mL (1,000 mLs Intravenous New Bag/Given 05/02/20 2149)  insulin aspart (novoLOG) injection 0-15 Units (has no administration in time range)  0.9 %  sodium chloride infusion (Manually program via Guardrails IV Fluids) (has no administration in time range)  lactated ringers bolus 1,000 mL (0 mLs Intravenous Stopped 05/02/20 2113)  ceFEPIme (MAXIPIME) 2 g in sodium chloride 0.9 % 100 mL IVPB (0 g Intravenous Stopped 05/02/20 2113)  acetaminophen (TYLENOL) suppository 650 mg (650 mg Rectal Given 05/02/20 2139)  ondansetron (ZOFRAN) injection 4 mg (4 mg Intravenous Given 05/02/20 2140)    ED Course  I have reviewed the triage vital signs and the nursing notes.  Pertinent labs & imaging results that were available during my care of the patient were reviewed by me and considered in my medical decision making (see chart for details).    MDM Rules/Calculators/A&P                          The patient has a temperature of 100.2, she is on chemotherapy, she will need a rectal temperature as well as a sepsis work-up.  She does appear tachypneic thus a chest x-ray and a Covid swab will be sent as well.  Covid sample sent, chest x-ray ordered, urinalysis ordered with an out cath and a rectal temperature.  Labs show that this patient has severe anemia with a hemoglobin of 6, there is a leukocytosis of 11,500 and a thrombocytopenia of 50,000.  The metabolic panel shows normal kidney function, slight hyponatremia with a normal  lactic acid but the urinalysis does reveal signs of urinary tract infection which correlates with the location of the patient's pain.  She will be treated for possible sepsis, she has no vomiting  Blood transfusion started, antibiotics started, discussed with Dr. Olevia Bowens who will admit.  Final Clinical Impression(s) / ED Diagnoses Final diagnoses:  Severe anemia  Thrombocytopenia (HCC)  Sepsis, due to unspecified organism, unspecified whether acute organ dysfunction present [facility])  Cystitis  Noemi Chapel, MD 05/02/20 2153

## 2020-05-02 NOTE — ED Notes (Signed)
Family updated on care.

## 2020-05-03 ENCOUNTER — Inpatient Hospital Stay (HOSPITAL_COMMUNITY): Payer: Medicare HMO

## 2020-05-03 DIAGNOSIS — D649 Anemia, unspecified: Secondary | ICD-10-CM

## 2020-05-03 DIAGNOSIS — I214 Non-ST elevation (NSTEMI) myocardial infarction: Secondary | ICD-10-CM

## 2020-05-03 DIAGNOSIS — A4150 Gram-negative sepsis, unspecified: Principal | ICD-10-CM

## 2020-05-03 DIAGNOSIS — R9431 Abnormal electrocardiogram [ECG] [EKG]: Secondary | ICD-10-CM

## 2020-05-03 DIAGNOSIS — D696 Thrombocytopenia, unspecified: Secondary | ICD-10-CM | POA: Diagnosis present

## 2020-05-03 LAB — CBC WITH DIFFERENTIAL/PLATELET
Abs Immature Granulocytes: 0.18 10*3/uL — ABNORMAL HIGH (ref 0.00–0.07)
Basophils Absolute: 0 10*3/uL (ref 0.0–0.1)
Basophils Relative: 0 %
Eosinophils Absolute: 0 10*3/uL (ref 0.0–0.5)
Eosinophils Relative: 0 %
HCT: 19.5 % — ABNORMAL LOW (ref 36.0–46.0)
Hemoglobin: 6.4 g/dL — CL (ref 12.0–15.0)
Immature Granulocytes: 1 %
Lymphocytes Relative: 5 %
Lymphs Abs: 0.9 10*3/uL (ref 0.7–4.0)
MCH: 32.8 pg (ref 26.0–34.0)
MCHC: 32.8 g/dL (ref 30.0–36.0)
MCV: 100 fL (ref 80.0–100.0)
Monocytes Absolute: 0.9 10*3/uL (ref 0.1–1.0)
Monocytes Relative: 6 %
Neutro Abs: 14 10*3/uL — ABNORMAL HIGH (ref 1.7–7.7)
Neutrophils Relative %: 88 %
Platelets: 59 10*3/uL — ABNORMAL LOW (ref 150–400)
RBC: 1.95 MIL/uL — ABNORMAL LOW (ref 3.87–5.11)
RDW: 21.3 % — ABNORMAL HIGH (ref 11.5–15.5)
WBC: 16 10*3/uL — ABNORMAL HIGH (ref 4.0–10.5)
nRBC: 0 % (ref 0.0–0.2)

## 2020-05-03 LAB — BASIC METABOLIC PANEL
Anion gap: 13 (ref 5–15)
BUN: 21 mg/dL (ref 8–23)
CO2: 17 mmol/L — ABNORMAL LOW (ref 22–32)
Calcium: 8.3 mg/dL — ABNORMAL LOW (ref 8.9–10.3)
Chloride: 101 mmol/L (ref 98–111)
Creatinine, Ser: 1.19 mg/dL — ABNORMAL HIGH (ref 0.44–1.00)
GFR calc Af Amer: 55 mL/min — ABNORMAL LOW (ref >60)
GFR calc non Af Amer: 48 mL/min — ABNORMAL LOW (ref >60)
Glucose, Bld: 252 mg/dL — ABNORMAL HIGH (ref 70–99)
Potassium: 3.8 mmol/L (ref 3.5–5.1)
Sodium: 131 mmol/L — ABNORMAL LOW (ref 135–145)

## 2020-05-03 LAB — BLOOD CULTURE ID PANEL (REFLEXED) - BCID2

## 2020-05-03 LAB — HEMOGLOBIN A1C
Hgb A1c MFr Bld: 9.4 % — ABNORMAL HIGH (ref 4.8–5.6)
Mean Plasma Glucose: 223.08 mg/dL

## 2020-05-03 LAB — MAGNESIUM: Magnesium: 1.1 mg/dL — ABNORMAL LOW (ref 1.7–2.4)

## 2020-05-03 LAB — ECHOCARDIOGRAM COMPLETE
AR max vel: 1.83 cm2
AV Area VTI: 1.66 cm2
AV Area mean vel: 1.66 cm2
AV Mean grad: 2.1 mmHg
AV Peak grad: 4.3 mmHg
Ao pk vel: 1.04 m/s
Area-P 1/2: 3.46 cm2
Height: 63 in
S' Lateral: 3.96 cm
Weight: 2240 oz

## 2020-05-03 LAB — TROPONIN I (HIGH SENSITIVITY)
Troponin I (High Sensitivity): 2096 ng/L (ref <18)
Troponin I (High Sensitivity): >27000 ng/L (ref <18)

## 2020-05-03 LAB — HIV ANTIBODY (ROUTINE TESTING W REFLEX): HIV Screen 4th Generation wRfx: NONREACTIVE

## 2020-05-03 LAB — GLUCOSE, CAPILLARY
Glucose-Capillary: 276 mg/dL — ABNORMAL HIGH (ref 70–99)
Glucose-Capillary: 312 mg/dL — ABNORMAL HIGH (ref 70–99)
Glucose-Capillary: 217 mg/dL — ABNORMAL HIGH (ref 70–99)
Glucose-Capillary: 186 mg/dL — ABNORMAL HIGH (ref 70–99)

## 2020-05-03 LAB — LACTIC ACID, PLASMA: Lactic Acid, Venous: 2.2 mmol/L (ref 0.5–1.9)

## 2020-05-03 LAB — MRSA PCR SCREENING: MRSA by PCR: NEGATIVE

## 2020-05-03 MED ORDER — METOPROLOL TARTRATE 25 MG PO TABS
25.0000 mg | ORAL_TABLET | Freq: Two times a day (BID) | ORAL | Status: DC
  Administered 2020-05-03: 25 mg via ORAL
  Filled 2020-05-03: qty 1

## 2020-05-03 MED ORDER — MORPHINE SULFATE (PF) 2 MG/ML IV SOLN
2.0000 mg | INTRAVENOUS | Status: DC | PRN
  Administered 2020-05-03: 2 mg via INTRAVENOUS
  Filled 2020-05-03: qty 1

## 2020-05-03 MED ORDER — MORPHINE SULFATE (PF) 4 MG/ML IV SOLN
4.0000 mg | Freq: Once | INTRAVENOUS | Status: AC
  Administered 2020-05-03: 4 mg via INTRAVENOUS
  Filled 2020-05-03: qty 1

## 2020-05-03 MED ORDER — METOPROLOL TARTRATE 5 MG/5ML IV SOLN
2.5000 mg | Freq: Once | INTRAVENOUS | Status: AC
  Administered 2020-05-03: 2.5 mg via INTRAVENOUS
  Filled 2020-05-03: qty 5

## 2020-05-03 MED ORDER — ASPIRIN 81 MG PO CHEW: 324.0000 mg | CHEWABLE_TABLET | Freq: Once | ORAL | Status: DC

## 2020-05-03 MED ORDER — NITROGLYCERIN 2 % TD OINT
0.5000 [in_us] | TOPICAL_OINTMENT | Freq: Four times a day (QID) | TRANSDERMAL | Status: AC
  Administered 2020-05-03: 0.5 [in_us] via TOPICAL
  Filled 2020-05-03 (×2): qty 1

## 2020-05-03 MED ORDER — INSULIN DEGLUDEC 100 UNIT/ML ~~LOC~~ SOPN: 20.0000 [IU] | PEN_INJECTOR | Freq: Every day | SUBCUTANEOUS | Status: DC

## 2020-05-03 MED ORDER — ASPIRIN 81 MG PO CHEW
81.0000 mg | CHEWABLE_TABLET | Freq: Every day | ORAL | Status: DC
  Administered 2020-05-03: 81 mg via ORAL
  Filled 2020-05-03: qty 1

## 2020-05-03 MED ORDER — INSULIN GLARGINE 100 UNIT/ML ~~LOC~~ SOLN
20.0000 [IU] | Freq: Every day | SUBCUTANEOUS | Status: DC
  Administered 2020-05-03: 20 [IU] via SUBCUTANEOUS
  Filled 2020-05-03 (×2): qty 0.2

## 2020-05-03 MED ORDER — CHLORTHALIDONE 25 MG PO TABS
25.0000 mg | ORAL_TABLET | Freq: Every day | ORAL | Status: DC
  Administered 2020-05-03: 25 mg via ORAL
  Filled 2020-05-03 (×3): qty 1

## 2020-05-03 MED ORDER — PROCHLORPERAZINE EDISYLATE 10 MG/2ML IJ SOLN: 5.0000 mg | Freq: Once | INTRAMUSCULAR | Status: DC

## 2020-05-03 MED ORDER — CHLORHEXIDINE GLUCONATE CLOTH 2 % EX PADS
6.0000 | MEDICATED_PAD | Freq: Every day | CUTANEOUS | Status: DC
  Administered 2020-05-03: 6 via TOPICAL

## 2020-05-03 MED ORDER — MAGNESIUM OXIDE 400 (241.3 MG) MG PO TABS
400.0000 mg | ORAL_TABLET | Freq: Two times a day (BID) | ORAL | Status: DC
  Administered 2020-05-03: 400 mg via ORAL
  Filled 2020-05-03: qty 1

## 2020-05-03 MED ORDER — MAGNESIUM SULFATE 2 GM/50ML IV SOLN
2.0000 g | Freq: Once | INTRAVENOUS | Status: AC
  Administered 2020-05-03: 2 g via INTRAVENOUS
  Filled 2020-05-03: qty 50

## 2020-05-03 MED ORDER — ATORVASTATIN CALCIUM 40 MG PO TABS
80.0000 mg | ORAL_TABLET | Freq: Every day | ORAL | Status: DC
  Administered 2020-05-03: 80 mg via ORAL
  Filled 2020-05-03: qty 2

## 2020-05-03 MED ORDER — INSULIN ASPART 100 UNIT/ML FLEXPEN: 0.0000 [IU] | PEN_INJECTOR | Freq: Three times a day (TID) | SUBCUTANEOUS | 11 refills | Status: AC

## 2020-05-03 MED ORDER — ASPIRIN 81 MG PO CHEW: 81.0000 mg | CHEWABLE_TABLET | Freq: Every day | ORAL | 0 refills | Status: AC

## 2020-05-03 MED ORDER — LEVOTHYROXINE SODIUM 25 MCG PO TABS
25.0000 ug | ORAL_TABLET | Freq: Every day | ORAL | Status: DC
  Administered 2020-05-03: 25 ug via ORAL
  Filled 2020-05-03: qty 1

## 2020-05-03 MED ORDER — VITAMIN D (ERGOCALCIFEROL) 1.25 MG (50000 UNIT) PO CAPS
50000.0000 [IU] | ORAL_CAPSULE | ORAL | Status: DC
  Filled 2020-05-03: qty 1

## 2020-05-03 MED ORDER — SENNOSIDES-DOCUSATE SODIUM 8.6-50 MG PO TABS: 2.0000 | ORAL_TABLET | Freq: Every day | ORAL | Status: DC

## 2020-05-03 MED ORDER — FAMOTIDINE IN NACL 20-0.9 MG/50ML-% IV SOLN
20.0000 mg | Freq: Once | INTRAVENOUS | Status: AC
  Administered 2020-05-03: 20 mg via INTRAVENOUS
  Filled 2020-05-03: qty 50

## 2020-05-03 NOTE — Progress Notes (Signed)
Present with Lisa Jenkins for emotional and spiritual support. We prayed for comfort and peace as she moves through these transitions of her health.  Will follow up while she remains here.

## 2020-05-03 NOTE — Discharge Summary (Signed)
Lisa Jenkins, is a 69 y.o. female  DOB [birth year]  MRN [id number].  Admission date:  05/02/2020  Admitting Physician  Reubin Milan, MD  Discharge Date:  05/03/2020   Primary MD  Denyce Robert, FNP  Recommendations for primary care physician for things to follow:   Discharge to residential hospice--- comfort care  Admission Diagnosis  Thrombocytopenia ([place]) [D69.6] Cystitis [N30.90] Severe anemia [D64.9] Sepsis due to undetermined organism ([place]) [A41.9] Sepsis, due to unspecified organism, unspecified whether acute organ dysfunction present [facility]) [A41.9]   Discharge Diagnosis  Thrombocytopenia ([place]) [D69.6] Cystitis [N30.90] Severe anemia [D64.9] Sepsis due to undetermined organism ([place]) [A41.9] Sepsis, due to unspecified organism, unspecified whether acute organ dysfunction present [facility]) [A41.9]    Principal Problem:   Sepsis due to Gram negative bacteria ([place]) Active Problems:   NSTEMI (non-ST elevated myocardial infarction) ([place])   Symptomatic anemia   Thrombocytopenia ([place])   Hyperlipidemia   Type 2 diabetes mellitus with complication ([place])   Hypothyroidism   Essential hypertension   Tobacco use   Sepsis due to undetermined organism ([place])   CAD (coronary artery disease)   Hyponatremia   Hypomagnesemia      Past Medical History:  Diagnosis Date  . Arthritis   . Cancer ([place])   . Coronary artery disease    a. s/p NSTEMI in 07/2017 with DES to mid-RCA and residual disease along proximal-mid LAD  . Diabetes mellitus without complication ([place])   . High cholesterol   . Hyperkalemia   . Hypertension   . Myocardial infarction ([place])   . Vitamin D deficiency     Past Surgical History:  Procedure Laterality Date  . ABDOMINAL HYSTERECTOMY    . Bilateral oopherectomy    . BUNIONECTOMY Bilateral   . CORONARY STENT INTERVENTION N/A 07/09/2017   Procedure: CORONARY STENT  INTERVENTION;  Surgeon: Belva Crome, MD;  Location: [facility];  Service: Cardiovascular;  Laterality: N/A;  . LEFT HEART CATH AND CORONARY ANGIOGRAPHY N/A 07/09/2017   Procedure: LEFT HEART CATH AND CORONARY ANGIOGRAPHY;  Surgeon: Belva Crome, MD;  Location: [place] [facility];  Service: Cardiovascular;  Laterality: N/A;  . PORTACATH PLACEMENT Left 03/01/2020   Procedure: INSERTION PORT-A-CATH;  Surgeon: Aviva Signs, MD;  Location: [facility];  Service: General;  Laterality: Left;  . ROBOTIC ASSISTED TOTAL HYSTERECTOMY WITH BILATERAL SALPINGO OOPHERECTOMY Bilateral 01/20/2020   Procedure: XI ROBOTIC ASSISTED TOTAL HYSTERECTOMY, UTERUS GREATER THAN 250 GRAMS WITH BILATERAL SALPINGO OOPHORECTOMY, MINI LAPAROTOMY;  Surgeon: Everitt Amber, MD;  Location: [facility];  Service: Gynecology;  Laterality: Bilateral;      HPI  from the history and physical done on the day of admission:    Chief Complaint: Abdominal pain and weakness.  HPI: Lisa Jenkins is a 69 y.o. female with medical history significant of osteoarthritis, CAD, history of NSTEMI in 2018, type 2 diabetes, hyperlipidemia, hyperkalemia, hypertension, hypothyroidism, history of tobacco use, history of retinal hemorrhage, vitamin D deficiency, ovarian cancer with 2 recent cycles of chemotherapy who  is coming to the emergency department with complaints of abdominal pain associated with dyspnea, LE weakness for the past 2 days, chills, fatigue and decreased appetite.  She denies fever, but was febrile here.  No rhinorrhea, sore throat, wheezing or hemoptysis.  She denies nausea, emesis, diarrhea, constipation, melena or hematochezia.  No dysuria, frequency or hematuria.  No polyuria, polydipsia, polyphagia or blurred vision.  ED Course: Initial vital signs were temperature 100.2 F, pulse 80, respirations 22, BP 122/56 mmHg and O2 sat 100% on room air.  She received a 1000 mL LR bolus, ondansetron 4 mg IVP and ceftriaxone 2 g IVPB.  I order  fentanyl 50 mcg and famotidine 20 mg IVP in the emergency department.  The patient was subsequently tachycardic in the 130s and developed precordial chest pain after arriving to the floor.  She was treated with supplemental oxygen, IV fluids, morphine, antiemetic and low-dose beta-blocker.  Cardiology on-call was contacted who deferred full dose antiplatelet therapy and heparin due to thrombocytopenia.  Urinalysis shows glucosuria 150, ketonuria 5 and proteinuria Khtari 100 mg/dL.  Small leukocyte esterase, 21-50 WBC per hpf with many bacteria.  CBC showed a white count 11.5, hemoglobin 6.0 g/dL and platelets 50.  PT 14.3, INR 1.2 PTT 39.  Initial lactic acid was 1.6 and follow-up was 3.6 mmol/L.  CMP sodium 129, potassium 3.9, chloride 100 and CO2 19 mmol/L.  Anion gap was normal.  Renal function was normal.  Glucose 288 and calcium 8.2 mg/dL.  Total protein was 5.9 and albumin 2.9 g/dL, the rest of the hepatic functions are unremarkable.  Magnesium levels 1.1 mg/dL.  Review of Systems: As per HPI otherwise all other systems reviewed and are negative    Hospital Course:     1)Acute on chronic anemia and thrombocytopenia--- hemoglobin down to 6 from a baseline usually around 10, platelets down to just over 50 from a baseline usually over 300----suspect this is neoplastic, compounded by chemotherapy -No evidence of significant bleeding at this time -Patient has multiple antibodies and unable PRBCs at this time -I am told that the only compatible PRBC available in the state of [place]--- to take a while to get the blood from [place] here --Patient is comfort care only at this time,  transitioning to hospice house  2)GNR Sepsis-patient presented with fevers, tachypnea, tachycardia and leukocytosis and lactic acidosis------- -Blood cultures with gram-negative rods -clinically suspect sepsis from urinary source-- --urine and blood cultures are pending -Chest x-ray without acute findings -Initially  patient had altered mentation and lethargy, altered mentation and lethargy improved significantly with IV fluids and IV antibiotics --Patient is comfort care only at this time,  transitioning to hospice house  3)H/o CAD----NSTEMI--- history of prior CAD, prior angioplasty and stent placement in 2018 presents with chest pain and elevated troponins Troponin >>> 27,000 -- --unable to treat with heparin, aspirin or Plavix given anemia and thrombocytopenia in the patient with multiple antibodies and will not be able to tolerate transfusion should she bleed  -Cardiology input appreciated --EF is 35 to 40% on echo today, anterior septal walls are severely hypokinetic, dilated left ventricular regional wall abnormalities, and echo also demonstrates grade 2 diastolic dysfunction today -Please note the patient's EF was 55% on LHC from 07/09/2017 -Patient is comfort care only at this time,  transitioning to hospice house  4)DM2- Use Novolog/Humalog Sliding scale insulin with Accu-Cheks/Fingersticks as ordered   5)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, on chemo - 02/2020 CT showed concerning  lung nodules, possible metastatic disease - 04/2020 CT new sclerotic bone lesions concering for mets, stable adrenal mass, RUL lung nodule new --Not a candidate for further chemotherapy given sepsis and severe anemia and severe thrombocytopenia  6)Social/Ethics--- conference with patient, patient's sister-in-law and patient's friend at bedside -- discussed treatment options -Patient and her friend and sister-in-law requested hospice consult -After speaking with hospice they have decided on -Patient is comfort care only at this time,  transitioning to hospice house -[place] Coho and sister in-law Florene Glen at bedside   Discharge Condition: Overall prognosis is grave  Follow UP   Contact information for after-discharge care    [place] .   Service: Inpatient Hospice Contact information: [address] [place] [id number]                   Consults obtained - cardiology/Oncology  Diet and Activity recommendation:  As advised  Discharge Instructions    Discharge Instructions    Call MD for:  difficulty breathing, headache or visual disturbances   Complete by: As directed    Call MD for:  persistant nausea and vomiting   Complete by: As directed    Call MD for:  severe uncontrolled pain   Complete by: As directed    Call MD for:  temperature >100.4   Complete by: As directed    Diet general   Complete by: As directed    Discharge instructions   Complete by: As directed    --Discharge to residential hospice--- comfort care   Increase activity slowly   Complete by: As directed         Discharge Medications     Allergies as of 05/03/2020   No Known Allergies     Medication List    STOP taking these medications   CARBOPLATIN IV   chlorthalidone 25 MG tablet Commonly known as: HYGROTON   hydrOXYzine 25 MG tablet Commonly known as: ATARAX/VISTARIL   lidocaine-prilocaine cream Commonly known as: EMLA   magnesium oxide 400 (241.3 Mg) MG tablet Commonly known as: MAG-OX   metFORMIN 1000 MG tablet Commonly known as: GLUCOPHAGE   Ozempic (1 MG/DOSE) 2 MG/1.5ML Sopn Generic drug: Semaglutide (1 MG/DOSE)   PACLITAXEL IV   prochlorperazine 10 MG tablet Commonly known as: COMPAZINE   senna-docusate 8.6-50 MG tablet Commonly known as: Senokot-S   Synthroid 25 MCG tablet Generic drug: levothyroxine   traMADol 50 MG tablet Commonly known as: Waldon Reining FlexTouch 100 UNIT/ML FlexTouch Pen Generic drug: insulin degludec   Vitamin D (Ergocalciferol) 1.25 MG (50000 UNIT) Caps capsule Commonly known as: DRISDOL     TAKE these medications   acetaminophen 325 MG tablet Commonly known as: TYLENOL Take 2 tablets (650 mg total) by mouth every 6 (six)  hours.   aspirin 81 MG chewable tablet Chew 1 tablet (81 mg total) by mouth daily.   atorvastatin 80 MG tablet Commonly known as: LIPITOR TAKE 1 Tablet BY MOUTH ONCE DAILY AT 6:00PM What changed:   how much to take  how to take this  when to take this   insulin aspart 100 UNIT/ML FlexPen Commonly known as: NOVOLOG Inject 0-10 Units into the skin 3 (three) times daily with meals. insulin aspart (novoLOG) injection 0-10 Units  0-10 Units Subcutaneous, 3 times daily with meals  CBG < 70: Implement Hypoglycemia Standing Orders and refer to Hypoglycemia Standing Orders sidebar report   CBG  70 - 120: 0 unit CBG 121 - 150: 0 unit  CBG 151 - 200: 1 unit  CBG 201 - 250: 2 units  CBG 251 - 300: 4 units  CBG 301 - 350: 6 units   CBG 351 - 400: 8 units  CBG > 400: 10 units   metoprolol tartrate 25 MG tablet Commonly known as: LOPRESSOR TAKE 1 Tablet  BY MOUTH TWICE DAILY       Major procedures and Radiology Reports - PLEASE review detailed and final reports for all details, in brief -    CT Chest W Contrast  Addendum Date: 04/26/2020   ADDENDUM REPORT: 04/26/2020 17:58 ADDENDUM: These results will be called to the ordering clinician or representative by the Radiologist Assistant, and communication documented in the PACS or [employer]. Electronically Signed   By: Zetta Bills M.D.   On: 04/26/2020 17:58   Result Date: 04/26/2020 CLINICAL DATA:  Endometrial cancer, follow-up, on chemotherapy following hysterectomy. EXAM: CT CHEST, ABDOMEN, AND PELVIS WITH CONTRAST TECHNIQUE: Multidetector CT imaging of the chest, abdomen and pelvis was performed following the standard protocol during bolus administration of intravenous contrast. CONTRAST:  166mL OMNIPAQUE IOHEXOL 300 MG/ML  SOLN COMPARISON:  Jan 07, 2020 FINDINGS: CT CHEST FINDINGS Cardiovascular: Calcified and noncalcified atheromatous plaque in the thoracic aorta. No aneurysmal dilation. The three-vessel coronary artery  disease. No pericardial effusion. Central pulmonary arteries are unremarkable on venous phase assessment. LEFT-sided Port-A-Cath terminates in the proximal to mid SVC. Mediastinum/Nodes: Thoracic inlet structures are normal. No axillary adenopathy. No mediastinal adenopathy. No hilar adenopathy. Esophagus grossly normal. Lungs/Pleura: No consolidation. No pleural effusion. Airways are patent. (Image 45, series 4) 11 x 9 mm sub solid nodule No effusion. No consolidation. No abnormality seen in this area on previous imaging from 2017 Musculoskeletal: See below for full musculoskeletal detail. No chest wall lesion. CT ABDOMEN PELVIS FINDINGS Hepatobiliary: Mildly lobular hepatic contours. Suggestion of background hepatic steatosis. No focal, suspicious hepatic lesion. No pericholecystic stranding. No biliary duct dilation. Pancreas: Pancreas is normal. Spleen: Spleen normal in size and contour. Adrenals/Urinary Tract: LEFT adrenal mass measures approximately 3.6 x 2.2 cm previously 3.7 x 2.3 cm. RIGHT adrenal with mild nodularity which is unchanged. Renal cortical scarring and renal vascular calcifications with similar appearance to the prior exam. Striated nephrogram on the RIGHT subtle heterogeneity of the LEFT renal nephrogram in the upper pole. RIGHT-sided abnormalities in the interpolar and lower pole aspects of the RIGHT kidney. No hydronephrosis. Urinary bladder grossly normal. Stomach/Bowel: Stomach under distended. No acute gastrointestinal process. Appendix is normal. Colonic diverticulosis. Vascular/Lymphatic: Calcific and noncalcific atheromatous plaque of the abdominal aorta. No aneurysmal dilation. No adenopathy in the retroperitoneum. Portal vein is patent. Hepatic veins are patent. No pelvic adenopathy. Reproductive: Post hysterectomy. Other: No ascites. Musculoskeletal: No acute musculoskeletal process. New area of sclerosis in the LEFT iliac (image 236, series 4) findings were not present previously  this measures 15 mm. (Image 229, 5 mm. (Image 212, series 4) new sclerotic focus in L5 approximately 1 cm. IMPRESSION: 1. New sclerotic bone lesions that are suspicious for metastatic disease. 2. New area of sub solid nodule in the right upper lobe measuring 11 x 9 mm. No abnormality seen in this area on previous imaging from 2017 this area. Morphology and location raising the question of primary pulmonary neoplasm. PET scan for above 2 findings may be helpful for further assessment given patient history. 3. LEFT adrenal mass is similar 2 its appearance dating back to 2017  where it was characterized as adenoma on a prior MRI. 4. Findings are concerning for bilateral pyelonephritis, worse on the RIGHT. Correlate with urinalysis. 5. Hepatic steatosis. 6. Aortic atherosclerosis. Aortic Atherosclerosis (ICD10-I70.0). Electronically Signed: By: Zetta Bills M.D. On: 04/26/2020 17:18   CT Abdomen Pelvis W Contrast  Addendum Date: 04/26/2020   ADDENDUM REPORT: 04/26/2020 17:58 ADDENDUM: These results will be called to the ordering clinician or representative by the Radiologist Assistant, and communication documented in the PACS or [employer]. Electronically Signed   By: Zetta Bills M.D.   On: 04/26/2020 17:58   Result Date: 04/26/2020 CLINICAL DATA:  Endometrial cancer, follow-up, on chemotherapy following hysterectomy. EXAM: CT CHEST, ABDOMEN, AND PELVIS WITH CONTRAST TECHNIQUE: Multidetector CT imaging of the chest, abdomen and pelvis was performed following the standard protocol during bolus administration of intravenous contrast. CONTRAST:  126mL OMNIPAQUE IOHEXOL 300 MG/ML  SOLN COMPARISON:  Jan 07, 2020 FINDINGS: CT CHEST FINDINGS Cardiovascular: Calcified and noncalcified atheromatous plaque in the thoracic aorta. No aneurysmal dilation. The three-vessel coronary artery disease. No pericardial effusion. Central pulmonary arteries are unremarkable on venous phase assessment. LEFT-sided Port-A-Cath  terminates in the proximal to mid SVC. Mediastinum/Nodes: Thoracic inlet structures are normal. No axillary adenopathy. No mediastinal adenopathy. No hilar adenopathy. Esophagus grossly normal. Lungs/Pleura: No consolidation. No pleural effusion. Airways are patent. (Image 45, series 4) 11 x 9 mm sub solid nodule No effusion. No consolidation. No abnormality seen in this area on previous imaging from 2017 Musculoskeletal: See below for full musculoskeletal detail. No chest wall lesion. CT ABDOMEN PELVIS FINDINGS Hepatobiliary: Mildly lobular hepatic contours. Suggestion of background hepatic steatosis. No focal, suspicious hepatic lesion. No pericholecystic stranding. No biliary duct dilation. Pancreas: Pancreas is normal. Spleen: Spleen normal in size and contour. Adrenals/Urinary Tract: LEFT adrenal mass measures approximately 3.6 x 2.2 cm previously 3.7 x 2.3 cm. RIGHT adrenal with mild nodularity which is unchanged. Renal cortical scarring and renal vascular calcifications with similar appearance to the prior exam. Striated nephrogram on the RIGHT subtle heterogeneity of the LEFT renal nephrogram in the upper pole. RIGHT-sided abnormalities in the interpolar and lower pole aspects of the RIGHT kidney. No hydronephrosis. Urinary bladder grossly normal. Stomach/Bowel: Stomach under distended. No acute gastrointestinal process. Appendix is normal. Colonic diverticulosis. Vascular/Lymphatic: Calcific and noncalcific atheromatous plaque of the abdominal aorta. No aneurysmal dilation. No adenopathy in the retroperitoneum. Portal vein is patent. Hepatic veins are patent. No pelvic adenopathy. Reproductive: Post hysterectomy. Other: No ascites. Musculoskeletal: No acute musculoskeletal process. New area of sclerosis in the LEFT iliac (image 236, series 4) findings were not present previously this measures 15 mm. (Image 229, 5 mm. (Image 212, series 4) new sclerotic focus in L5 approximately 1 cm. IMPRESSION: 1. New  sclerotic bone lesions that are suspicious for metastatic disease. 2. New area of sub solid nodule in the right upper lobe measuring 11 x 9 mm. No abnormality seen in this area on previous imaging from 2017 this area. Morphology and location raising the question of primary pulmonary neoplasm. PET scan for above 2 findings may be helpful for further assessment given patient history. 3. LEFT adrenal mass is similar 2 its appearance dating back to 2017 where it was characterized as adenoma on a prior MRI. 4. Findings are concerning for bilateral pyelonephritis, worse on the RIGHT. Correlate with urinalysis. 5. Hepatic steatosis. 6. Aortic atherosclerosis. Aortic Atherosclerosis (ICD10-I70.0). Electronically Signed: By: Zetta Bills M.D. On: 04/26/2020 17:18   DG Chest Port 1 View  Result Date:  05/02/2020 CLINICAL DATA:  Per triage note; Pt c/o of abdominal pain and weakness x 2 days. Recent hysterectomy and being treated for ovarian cancer EXAM: PORTABLE CHEST 1 VIEW COMPARISON:  Chest radiograph 03/01/2020 FINDINGS: Left chest port in place with catheter tip projecting in the upper SVC. Stable cardiomediastinal contours. Chronic coarse interstitial markings. No new focal opacity. No pneumothorax or pleural effusion. No acute finding in the visualized skeleton. IMPRESSION: No acute cardiopulmonary process. Electronically Signed   By: Audie Pinto M.D.   On: 05/02/2020 20:29   ECHOCARDIOGRAM COMPLETE  Result Date: 05/03/2020    ECHOCARDIOGRAM REPORT   Patient Name:   Lisa Jenkins Date of Exam: 05/03/2020 Medical Rec #:  [id number]    Height:       63.0 in Accession #:    [id number]   Weight:       140.0 lb Date of Birth:  1951-03-24    BSA:          1.662 m Patient Age:    56 years     BP:           101/62 mmHg Patient Gender: F            HR:           88 bpm. Exam Location:  [facility] Procedure: 2D Echo Indications:    Elevated Troponin  History:        Patient has no prior history of Echocardiogram  examinations. CAD                 and Previous Myocardial Infarction; Risk Factors:Dyslipidemia,                 Diabetes and Hypertension. Endometrial cancer.  Sonographer:    Leavy Cella RDCS (AE) Referring Phys: [id number] DAVID MANUEL [place]  1. The anterior and anteroseptal walls are severely hypokinetic. Left ventricular ejection fraction, by estimation, is 35 to 40%. The left ventricle has moderately decreased function. The left ventricle demonstrates regional wall motion abnormalities (see scoring diagram/findings for description). There is mild left ventricular hypertrophy. Left ventricular diastolic parameters are consistent with Grade II diastolic dysfunction (pseudonormalization). Elevated left atrial pressure.  2. Right ventricular systolic function is normal. The right ventricular size is normal. There is moderately elevated pulmonary artery systolic pressure.  3. The mitral valve is normal in structure. Trivial mitral valve regurgitation. No evidence of mitral stenosis.  4. The aortic valve is tricuspid. Aortic valve regurgitation is not visualized. No aortic stenosis is present.  5. The inferior vena cava is normal in size with greater than 50% respiratory variability, suggesting right atrial pressure of 3 mmHg. FINDINGS  Left Ventricle: The anterior and anteroseptal walls are severely hypokinetic. Left ventricular ejection fraction, by estimation, is 35 to 40%. The left ventricle has moderately decreased function. The left ventricle demonstrates regional wall motion abnormalities. The left ventricular internal cavity size was normal in size. There is mild left ventricular hypertrophy. Left ventricular diastolic parameters are consistent with Grade II diastolic dysfunction (pseudonormalization). Elevated left atrial pressure. Right Ventricle: The right ventricular size is normal. No increase in right ventricular wall thickness. Right ventricular systolic function is normal. There is  moderately elevated pulmonary artery systolic pressure. The tricuspid regurgitant velocity is 3.03 m/s, and with an assumed right atrial pressure of 10 mmHg, the estimated right ventricular systolic pressure is 26.7 mmHg. Left Atrium: Left atrial size was normal in size. Right Atrium: Right atrial size was normal in size.  Pericardium: There is no evidence of pericardial effusion. Mitral Valve: The mitral valve is normal in structure. Trivial mitral valve regurgitation. No evidence of mitral valve stenosis. Tricuspid Valve: The tricuspid valve is normal in structure. Tricuspid valve regurgitation is trivial. No evidence of tricuspid stenosis. Aortic Valve: The aortic valve is tricuspid. . There is mild thickening and mild calcification of the aortic valve. Aortic valve regurgitation is not visualized. No aortic stenosis is present. Mild aortic valve annular calcification. There is mild thickening of the aortic valve. There is mild calcification of the aortic valve. Aortic valve mean gradient measures 2.1 mmHg. Aortic valve peak gradient measures 4.3 mmHg. Aortic valve area, by VTI measures 1.66 cm. Pulmonic Valve: The pulmonic valve was not well visualized. Pulmonic valve regurgitation is not visualized. No evidence of pulmonic stenosis. Aorta: The aortic root is normal in size and structure. Pulmonary Artery: Indeterminant PASP, inadeate TR jet. Venous: The inferior vena cava is normal in size with greater than 50% respiratory variability, suggesting right atrial pressure of 3 mmHg. IAS/Shunts: The interatrial septum was not well visualized.  LEFT VENTRICLE PLAX 2D LVIDd:         4.74 cm  Diastology LVIDs:         3.96 cm  LV e' lateral:   3.75 cm/s LV PW:         1.19 cm  LV E/e' lateral: 18.2 LV IVS:        1.01 cm  LV e' medial:    3.38 cm/s LVOT diam:     1.90 cm  LV E/e' medial:  20.1 LV SV:         28 LV SV Index:   17 LVOT Area:     2.84 cm  RIGHT VENTRICLE RV S prime:     8.18 cm/s TAPSE (M-mode): 1.4 cm  LEFT ATRIUM             Index       RIGHT ATRIUM           Index LA diam:        3.50 cm 2.11 cm/m  RA Area:     11.90 cm LA Vol (A2C):   45.3 ml 27.26 ml/m RA Volume:   30.70 ml  18.48 ml/m LA Vol (A4C):   33.3 ml 20.04 ml/m LA Biplane Vol: 42.1 ml 25.34 ml/m  AORTIC VALVE AV Area (Vmax):    1.83 cm AV Area (Vmean):   1.66 cm AV Area (VTI):     1.66 cm AV Vmax:           103.84 cm/s AV Vmean:          66.423 cm/s AV VTI:            0.171 m AV Peak Grad:      4.3 mmHg AV Mean Grad:      2.1 mmHg LVOT Vmax:         66.84 cm/s LVOT Vmean:        38.984 cm/s LVOT VTI:          0.100 m LVOT/AV VTI ratio: 0.59  AORTA Ao Root diam: 2.70 cm MITRAL VALVE               TRICUSPID VALVE MV Area (PHT): 3.46 cm    TR Peak grad:   36.7 mmHg MV Decel Time: 219 msec    TR Vmax:        303.00 cm/s MV E velocity: 68.10 cm/s MV  A velocity: 48.80 cm/s  SHUNTS MV E/A ratio:  1.40        Systemic VTI:  0.10 m                            Systemic Diam: 1.90 cm Carlyle Dolly MD Electronically signed by Carlyle Dolly MD Signature Date/Time: 05/03/2020/2:49:09 PM    Final     Micro Results   Recent Results (from the past 240 hour(s))  SARS Coronavirus 2 by RT PCR (hospital order, performed in [facility] hospital lab) Nasopharyngeal Nasopharyngeal Swab     Status: None   Collection Time: 05/02/20  7:27 PM   Specimen: Nasopharyngeal Swab  Result Value Ref Range Status   SARS Coronavirus 2 NEGATIVE NEGATIVE Final    Comment: (NOTE) SARS-CoV-2 target nucleic acids are NOT DETECTED.  The SARS-CoV-2 RNA is generally detectable in upper and lower respiratory specimens during the acute phase of infection. The lowest concentration of SARS-CoV-2 viral copies this assay can detect is 250 copies / mL. A negative result does not preclude SARS-CoV-2 infection and should not be used as the sole basis for treatment or other patient management decisions.  A negative result may occur with improper specimen collection /  handling, submission of specimen other than nasopharyngeal swab, presence of viral mutation(s) within the areas targeted by this assay, and inadequate number of viral copies (<250 copies / mL). A negative result must be combined with clinical observations, patient history, and epidemiological information.  Fact Sheet for Patients:   StrictlyIdeas.no  Fact Sheet for Healthcare Providers: BankingDealers.co.za  This test is not yet approved or  cleared by the [place] FDA and has been authorized for detection and/or diagnosis of SARS-CoV-2 by FDA under an Emergency Use Authorization (EUA).  This EUA will remain in effect (meaning this test can be used) for the duration of the COVID-19 declaration under Section 564(b)(1) of the Act, 21 U.S.C. section 360bbb-3(b)(1), unless the authorization is terminated or revoked sooner.  Performed at [facility], [address], [place], [place] [address]   Blood culture (routine single)     Status: None (Preliminary result)   Collection Time: 05/02/20  7:33 PM   Specimen: BLOOD  Result Value Ref Range Status   Specimen Description BLOOD LEFT ANTECUBITAL  Final   Special Requests   Final    BOTTLES DRAWN AEROBIC AND ANAEROBIC Blood Culture adequate volume   Culture  Setup Time   Final    GRAM NEGATIVE RODS RECOVERED FROM THE ANAEROBIC BOTTLE Gram Stain Report Called to,Read Back By and Verified With: FOLEY,B. AT 29 ON 05/03/2020 BY BAUGHAM,M. Performed at [facility] Performed at [facility], [address], [place], [place] [address]    Culture PENDING  Incomplete   Report Status PENDING  Incomplete       Today   Subjective    Lisa Jenkins today has decided on -Patient is comfort care only at this time,  transitioning to hospice house        Friend Neysa Hotter and sister in-law Florene Glen at bedside  Patient has been seen and examined prior to discharge   Objective   Blood  pressure (!) 103/51, pulse 98, temperature 97.9 F (36.6 C), temperature source Oral, resp. rate (!) 24, height 5\' 3"  (1.6 m), weight 63.5 kg, SpO2 93 %.   Intake/Output Summary (Last 24 hours) at 05/03/2020 1754 Last data filed at 05/02/2020 2201 Gross per  24 hour  Intake 1200 ml  Output --  Net 1200 ml   Exam Gen:- Awake Alert, looks ill  HEENT:- [place].AT, No sclera icterus Neck-Supple Neck,   Lungs-  Diminished breath sounds , scattered rhonchi CV- S1, S2 normal, regular Abd-  +ve B.Sounds, Abd Soft, No tenderness,    Extremity/Skin:-   good pulses Psych-affect is appropriate, oriented x3 Neuro-generalized weakness, no new focal deficits, no tremors    Data Review   CBC w Diff:  Lab Results  Component Value Date   WBC 16.0 (H) 05/03/2020   HGB 6.4 (LL) 05/03/2020   HGB 11.5 07/19/2017   HCT 19.5 (L) 05/03/2020   HCT 35.7 07/19/2017   PLT 59 (L) 05/03/2020   PLT 409 (H) 07/19/2017   LYMPHOPCT 5 05/03/2020   MONOPCT 6 05/03/2020   EOSPCT 0 05/03/2020   BASOPCT 0 05/03/2020    CMP:  Lab Results  Component Value Date   NA 131 (L) 05/03/2020   K 3.8 05/03/2020   CL 101 05/03/2020   CO2 17 (L) 05/03/2020   BUN 21 05/03/2020   CREATININE 1.19 (H) 05/03/2020   CREATININE 0.82 05/28/2016   PROT 5.9 (L) 05/02/2020   ALBUMIN 2.9 (L) 05/02/2020   BILITOT 0.5 05/02/2020   ALKPHOS 98 05/02/2020   AST 10 (L) 05/02/2020   ALT 10 05/02/2020  .   Total Discharge time is about 33 minutes  Roxan Hockey M.D on 05/03/2020 at 5:54 PM  Go to [email or web address] -  for contact info  [facility] - Office  [phone]

## 2020-05-03 NOTE — Progress Notes (Signed)
Lab called and notified me of the patient's unit of blood ordered for transfusion. Lab states that there are no appropriate matches in the state of [place] and the closest place they are reaching out to is [place]. MD notified via amion of the situation.

## 2020-05-03 NOTE — Care Plan (Signed)
Report called to [facility]. EMS notified.

## 2020-05-03 NOTE — Progress Notes (Signed)
EMS called for transport of patient to the [place] a truck will come as soon as available.

## 2020-05-03 NOTE — Consult Note (Signed)
Cardiology Consultation:   Patient ID: Lisa Jenkins MRN: [id number]; DOB: 08-Sep-1950  Admit date: 05/02/2020 Date of Consult: 05/03/2020  Primary Care Provider: Denyce Robert, [facility] Cardiologist: Kate Sable, MD (Inactive)  [facility] Electrophysiologist:  None    Patient Profile:   Lisa Jenkins is a 69 y.o. female with a hx of endometrial cancer on chemo, CAD with prior stenting who is being seen today for the evaluation of elevated troponin/NSTEMI at the request of Dr Denton Brick.  History of Present Illness:   Lisa Jenkins 69 yo female history of CAD with prior NSTEMI and DES to mid RCA in 07/2017, HTN, HL, DM2 presents with abdominal pain and weakness. Being managed for sepsis and gram neg rod bacteremia, as well as severe anemia and thrombocytopenia by primary team. In this setting developed chest pain, found to have EKG changes and elevated troponin.   Lactic acid 1.6-->3.6 K 3.9 Na 129 BUN 18 Cr 0.98 WBC 11.5 Hgb 6 Plt 50 Mg 1.1 Trop 2096-->27,000 EKG SR, V4-V6 ST depression COVID neg CXR no acute process Echo pending  07/2017 cath: prox LAD 50%, RCA 85%. Received DES to RCA    Past Medical History:  Diagnosis Date  . Arthritis   . Cancer ([place])   . Coronary artery disease    a. s/p NSTEMI in 07/2017 with DES to mid-RCA and residual disease along proximal-mid LAD  . Diabetes mellitus without complication ([place])   . High cholesterol   . Hyperkalemia   . Hypertension   . Myocardial infarction ([place])   . Vitamin D deficiency     Past Surgical History:  Procedure Laterality Date  . ABDOMINAL HYSTERECTOMY    . Bilateral oopherectomy    . BUNIONECTOMY Bilateral   . CORONARY STENT INTERVENTION N/A 07/09/2017   Procedure: CORONARY STENT INTERVENTION;  Surgeon: Belva Crome, MD;  Location: [place] [facility];  Service: Cardiovascular;  Laterality: N/A;  . LEFT HEART CATH AND CORONARY ANGIOGRAPHY N/A 07/09/2017   Procedure: LEFT HEART CATH AND CORONARY  ANGIOGRAPHY;  Surgeon: Belva Crome, MD;  Location: [place] [facility];  Service: Cardiovascular;  Laterality: N/A;  . PORTACATH PLACEMENT Left 03/01/2020   Procedure: INSERTION PORT-A-CATH;  Surgeon: Aviva Signs, MD;  Location: [facility];  Service: General;  Laterality: Left;  . ROBOTIC ASSISTED TOTAL HYSTERECTOMY WITH BILATERAL SALPINGO OOPHERECTOMY Bilateral 01/20/2020   Procedure: XI ROBOTIC ASSISTED TOTAL HYSTERECTOMY, UTERUS GREATER THAN 250 GRAMS WITH BILATERAL SALPINGO OOPHORECTOMY, MINI LAPAROTOMY;  Surgeon: Everitt Amber, MD;  Location: [facility];  Service: Gynecology;  Laterality: Bilateral;       Inpatient Medications: Scheduled Meds: . sodium chloride   Intravenous Once  . aspirin  81 mg Oral Daily  . atorvastatin  80 mg Oral q1800  . chlorthalidone  25 mg Oral Daily  . feeding supplement (ENSURE ENLIVE)  237 mL Oral BID BM  . insulin aspart  0-15 Units Subcutaneous TID WC  . insulin glargine  20 Units Subcutaneous Daily  . levothyroxine  25 mcg Oral QAC breakfast  . magnesium oxide  400 mg Oral BID  . metoprolol tartrate  25 mg Oral BID  . nitroGLYCERIN  0.5 inch Topical Q6H  . prochlorperazine  5 mg Intravenous Once  . senna-docusate  2 tablet Oral QHS  . [START ON 05-07-20] Vitamin D (Ergocalciferol)  50,000 Units Oral Q Tue   Continuous Infusions: . cefTRIAXone (ROCEPHIN)  IV     PRN Meds: acetaminophen **OR** acetaminophen, morphine injection, ondansetron **  OR** ondansetron (ZOFRAN) IV  Allergies:   No Known Allergies  Social History:   Social History   Socioeconomic History  . Marital status: Divorced    Spouse name: Not on file  . Number of children: Not on file  . Years of education: Not on file  . Highest education level: Not on file  Occupational History  . Occupation: retired  Tobacco Use  . Smoking status: Current Every Day Smoker    Packs/day: 0.50    Years: 46.00    Pack years: 23.00    Types: Cigarettes  . Smokeless tobacco: Never Used    Vaping Use  . Vaping Use: Never used  Substance and Sexual Activity  . Alcohol use: Not Currently    Alcohol/week: 0.0 standard drinks  . Drug use: No  . Sexual activity: Not Currently  Other Topics Concern  . Not on file  Social History Narrative  . Not on file   Social Determinants of Health   Financial Resource Strain: Low Risk   . Difficulty of Paying Living Expenses: Not hard at all  Food Insecurity: No Food Insecurity  . Worried About Charity fundraiser in the Last Year: Never true  . Ran Out of Food in the Last Year: Never true  Transportation Needs: No Transportation Needs  . Lack of Transportation (Medical): No  . Lack of Transportation (Non-Medical): No  Physical Activity: Insufficiently Active  . Days of Exercise per Week: 2 days  . Minutes of Exercise per Session: 10 min  Stress: No Stress Concern Present  . Feeling of Stress : Only a little  Social Connections: Moderately Integrated  . Frequency of Communication with Friends and Family: Twice a week  . Frequency of Social Gatherings with Friends and Family: Twice a week  . Attends Religious Services: More than 4 times per year  . Active Member of Clubs or Organizations: No  . Attends Archivist Meetings: 1 to 4 times per year  . Marital Status: Divorced  Human resources officer Violence: Not At Risk  . Fear of Current or Ex-Partner: No  . Emotionally Abused: No  . Physically Abused: No  . Sexually Abused: No    Family History:    Family History  Problem Relation Age of Onset  . Diabetes Mother   . Heart disease Mother   . Diabetes Sister   . Heart disease Sister   . Diabetes Brother   . Heart disease Brother   . Diabetes Brother   . Heart disease Brother   . Diabetes Brother   . Heart disease Brother   . Diabetes Brother   . Heart disease Brother   . Diabetes Brother   . Heart disease Brother   . Breast cancer Neg Hx   . Ovarian cancer Neg Hx   . Endometrial cancer Neg Hx   . Colon  cancer Neg Hx      ROS:  Please see the history of present illness.   All other ROS reviewed and negative.     Physical Exam/Data:   Vitals:   05/03/20 0017 05/03/20 0117 05/03/20 0223 05/03/20 0750  BP:  134/73 114/65   Pulse:  (!) 133 (!) 113   Resp:  16 19   Temp:   97.8 F (36.6 C) (!) 97 F (36.1 C)  TempSrc:    Axillary  SpO2: 100% 94% 91%   Weight:      Height:        Intake/Output Summary (Last  24 hours) at 05/03/2020 1022 Last data filed at 05/02/2020 2201 Gross per 24 hour  Intake 1200 ml  Output --  Net 1200 ml   Last 3 Weights 05/02/2020 04/13/2020 04/05/2020  Weight (lbs) 140 lb 135 lb 12.8 oz 136 lb  Weight (kg) 63.504 kg 61.598 kg 61.689 kg     Body mass index is 24.8 kg/m.  General:  Well nourished, well developed, in no acute distress HEENT: normal Lymph: no adenopathy Neck: no JVD Endocrine:  No thryomegaly Vascular: No carotid bruits; FA pulses 2+ bilaterally without bruits  Cardiac:  normal S1, S2; RRR; no murmur  Lungs:mild coarse bilaterla breath sounds Abd: soft, nontender, no hepatomegaly  Ext: no edema Musculoskeletal:  No deformities, BUE and BLE strength normal and equal Skin: warm and dry  Neuro:  CNs 2-12 intact, no focal abnormalities noted Psych:  Normal affect    Laboratory Data:  High Sensitivity Troponin:   Recent Labs  Lab 05/03/20 0137  TROPONINIHS 2,096*     Chemistry Recent Labs  Lab 05/02/20 1932 05/03/20 0137  NA 129* 131*  K 3.9 3.8  CL 100 101  CO2 19* 17*  GLUCOSE 288* 252*  BUN 18 21  CREATININE 0.98 1.19*  CALCIUM 8.2* 8.3*  GFRNONAA >60 48*  GFRAA >60 55*  ANIONGAP 10 13    Recent Labs  Lab 05/02/20 1932  PROT 5.9*  ALBUMIN 2.9*  AST 10*  ALT 10  ALKPHOS 98  BILITOT 0.5   Hematology Recent Labs  Lab 05/02/20 1932 05/03/20 0137  WBC 11.5* 16.0*  RBC 1.81* 1.95*  HGB 6.0* 6.4*  HCT 17.8* 19.5*  MCV 98.3 100.0  MCH 33.1 32.8  MCHC 33.7 32.8  RDW 21.4* 21.3*  PLT 50* 59*   BNPNo  results for input(s): BNP, PROBNP in the last 168 hours.  DDimer No results for input(s): DDIMER in the last 168 hours.   Radiology/Studies:  DG Chest Port 1 View  Result Date: 05/02/2020 CLINICAL DATA:  Per triage note; Pt c/o of abdominal pain and weakness x 2 days. Recent hysterectomy and being treated for ovarian cancer EXAM: PORTABLE CHEST 1 VIEW COMPARISON:  Chest radiograph 03/01/2020 FINDINGS: Left chest port in place with catheter tip projecting in the upper SVC. Stable cardiomediastinal contours. Chronic coarse interstitial markings. No new focal opacity. No pneumothorax or pleural effusion. No acute finding in the visualized skeleton. IMPRESSION: No acute cardiopulmonary process. Electronically Signed   By: Audie Pinto M.D.   On: 05/02/2020 20:29   {  Assessment and Plan:   1. NSTEMI - in setting of severe anemia Hgb 6 on admission, lactic acidosis, tachycardia - management complicated by severe thrombocytopenia  - suspect large component of demand ischemia in setting of sepsis. Decreased supply with severe anemia on admission. WIth second trop >09,326 certainly appears to be a component of obstructive CAD as well, much higher than demand ischemia alone would cause.    - with severe anemia and thromboctyopenia and active sepsis she is not a cath candidate. Also history of endometrial cancer advanced on chemo - would avoid anticoag at this time. Once transfused if Hgb stable and platelets normalize and sepsis resolves could reconsider cath pending overall clinical prognosis.  - would transfuse Hgb to 9 when able.  - already on medical therapy with ASA, atorva, low dose lopressor. If significant sysotlic dysfunction would hold lopressor. HOld ACE/ARB and follow hemodynamics.   2. Anemia - Hgb 6 on admission - transfusions per primary team,  from nursing note there is difficulty finding a match for her and having to look out of state.   3. Thrombocytopenia - plateltes 50 on  admission, mild uptrend today to 59   4. Sepsis - fever 100.5 druing admission, other SIRs criteria - unknown source  - broadspectrum abx per primary team  - cultures with gram neg rods blood  5. 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 Very guarded prognosis, consider early assessment of goals of care and possible palliative consult   For questions or updates, please contact [place] Please consult [email or web address] for contact info under    Signed, Carlyle Dolly, MD  05/03/2020 10:22 AM

## 2020-05-03 NOTE — Progress Notes (Signed)
Pt out with [facility] at [place]. Awake and oriented and in no obvious or stated distress.

## 2020-05-03 NOTE — TOC Transition Note (Addendum)
Transition of Care [facility]) - CM/SW Discharge Note   Patient Details  Name: IVELIZ GARAY MRN: [id number] Date of Birth: October 18, 1950  Transition of Care [facility] CM/SW Contact:  Boneta Lucks, RN Phone Number: 05/03/2020, 3:33 PM   Clinical Narrative:   MD met with patient and family, the requested comfort care and want [facility] facility. Referral sent to [facility], she will contact family in the room and update TOC when patient can discharge.   Addendum: Consent signed, RN will send DC summary, call report and get patient to Hospice house this evening.    Barriers to Discharge: Barriers Resolved   Patient Goals and CMS Choice Patient states their goals for this hospitalization and ongoing recovery are:: to go to [facility]. CMS Medicare.gov Compare Post Acute Care list provided to:: Patient Choice offered to / list presented to : Patient  Discharge Placement       [place] [facility]

## 2020-05-03 NOTE — Discharge Instructions (Signed)
Discharge to residential hospice--- comfort care

## 2020-05-03 NOTE — Progress Notes (Signed)
*  PRELIMINARY RESULTS* Echocardiogram 2D Echocardiogram has been performed.  Leavy Cella 05/03/2020, 12:49 PM

## 2020-05-03 NOTE — Plan of Care (Signed)
CRITICAL VALUE ALERT  Critical Value:  Troponin > 27,000  Date & Time Notied:  8/30 10:40  Provider Notified: Dr. Joesph Fillers   Orders Received/Actions taken: See new orders

## 2020-05-03 NOTE — Plan of Care (Signed)
CRITICAL VALUE ALERT  Critical Value:  Positive blood cultures, gram negative rods   Date & Time Notied:  08/30 10:40  Provider Notified: Dr. Joesph Fillers   Orders Received/Actions taken: See new orders

## 2020-05-03 NOTE — Progress Notes (Signed)
**Note De-identified Gilmar Bua Obfuscation** EKG complete and placed in patient chart 

## 2020-05-03 NOTE — Progress Notes (Signed)
CRITICAL VALUE ALERT  Critical Value:  Lactic Acid: 3.6  Date & Time Notied:  05/02/20 2328  Provider Notified: On-call clinician via Amion  Orders Received/Actions taken: Lactated Ringer Bolus

## 2020-05-04 ENCOUNTER — Ambulatory Visit (HOSPITAL_COMMUNITY): Payer: Medicare HMO

## 2020-05-04 ENCOUNTER — Inpatient Hospital Stay (HOSPITAL_COMMUNITY): Payer: Medicare HMO | Admitting: Hematology

## 2020-05-04 ENCOUNTER — Inpatient Hospital Stay (HOSPITAL_COMMUNITY): Payer: Medicare HMO

## 2020-05-05 LAB — CULTURE, BLOOD (SINGLE): Special Requests: ADEQUATE

## 2020-05-05 LAB — URINE CULTURE: Culture: >=100000 — AB

## 2020-05-05 DEATH — deceased

## 2020-05-06 ENCOUNTER — Ambulatory Visit (HOSPITAL_COMMUNITY): Payer: Medicare HMO

## 2020-05-06 LAB — TYPE AND SCREEN
ABO/RH(D): O POS
Antibody Screen: POSITIVE
Donor AG Type: NEGATIVE
Donor AG Type: NEGATIVE
Unit division: 0
Unit division: 0

## 2020-05-06 LAB — BPAM RBC
Blood Product Expiration Date: 202110072359
Unit Type and Rh: 5100

## 2020-05-21 ENCOUNTER — Encounter (HOSPITAL_COMMUNITY): Payer: Medicare HMO

## 2020-06-22 ENCOUNTER — Ambulatory Visit: Payer: Medicare HMO

## 2021-02-18 IMAGING — CT CT CHEST W/ CM
2 of 4 series · 15 of 36 positions shown, 18 images · IV contrast (Omnipaque or Isovue)
Comparison: CT angio chest 07/28/2016

CLINICAL DATA: Stage III endometrial carcinosarcoma.

EXAM:
CT CHEST WITH CONTRAST
TECHNIQUE: Multidetector CT imaging of the chest was performed during
intravenous contrast administration.
CONTRAST:  75mL OMNIPAQUE IOHEXOL 300 MG/ML  SOLN

[Series 2: routine chest with · axial · 0.64mm/px · z∈[+1395,+1649]mm · 12 of 151 slices shown, 15 images]
[im 12/151  mediastinal]
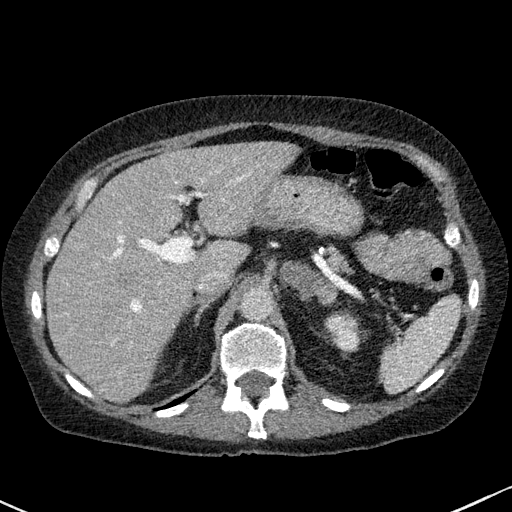
[im 12/151  lung]
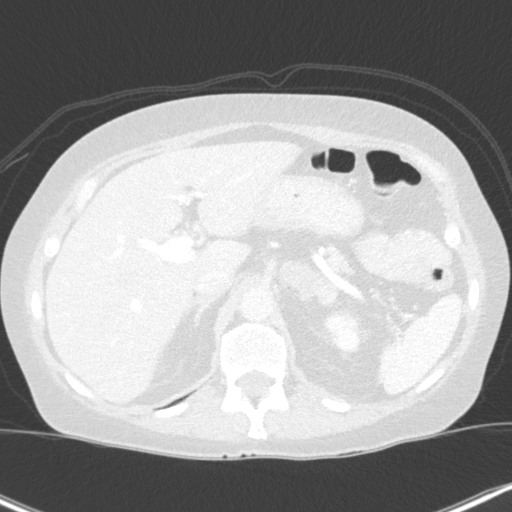
[im 24/151  lung]
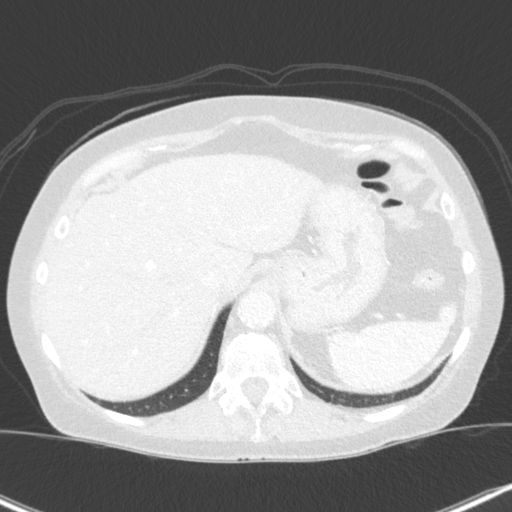
[im 35/151  lung]
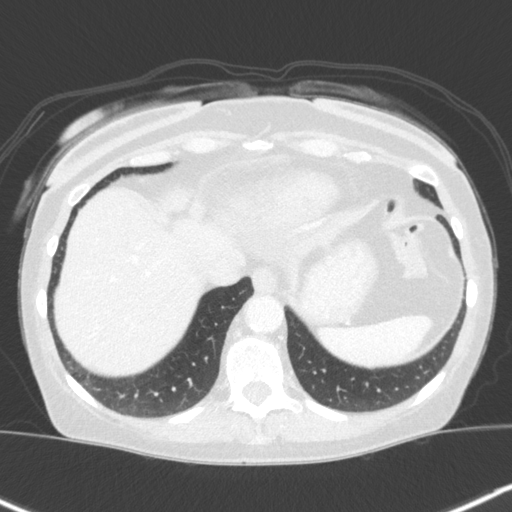
[im 47/151  lung]
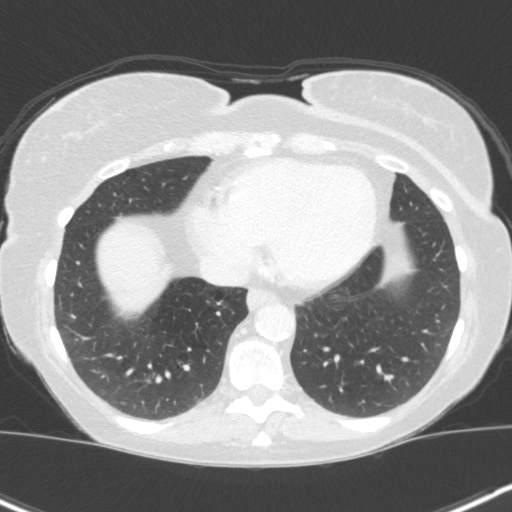
[im 58/151  mediastinal]
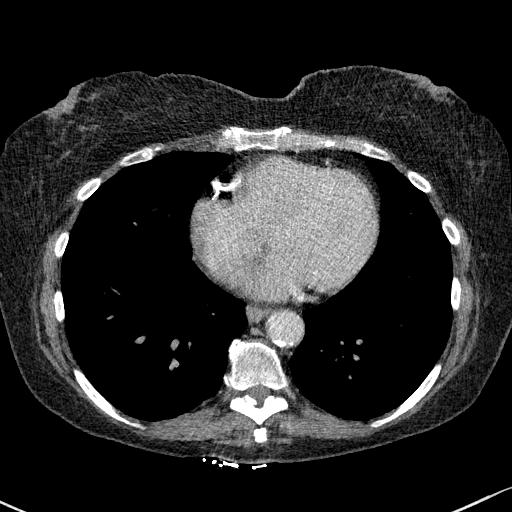
[im 58/151  lung]
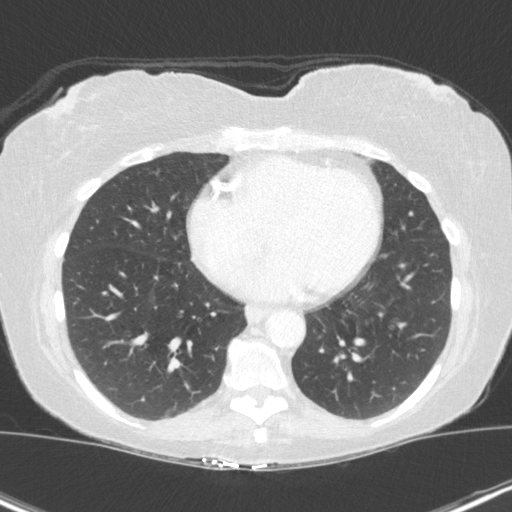
[im 70/151  lung]
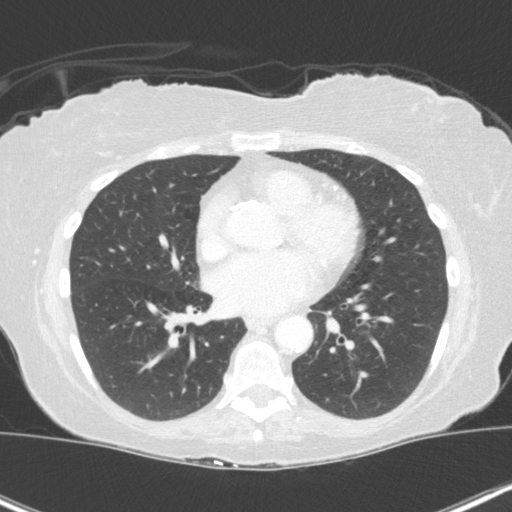
[im 81/151  lung]
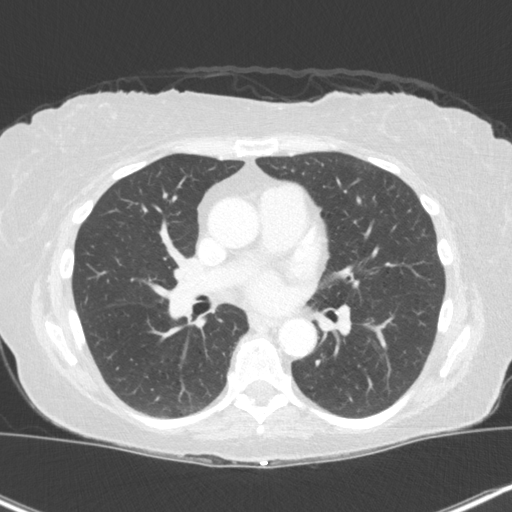
[im 93/151  lung]
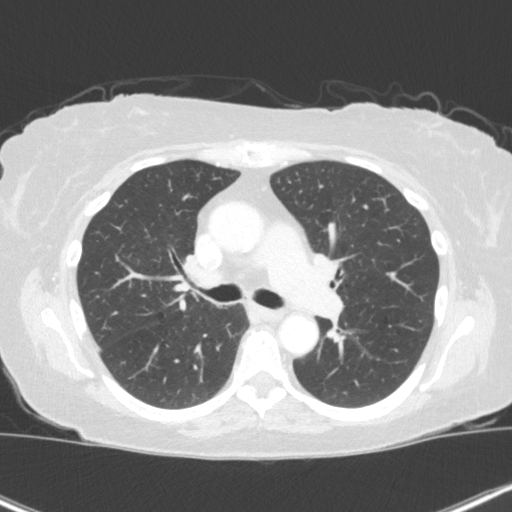
[im 104/151  mediastinal]
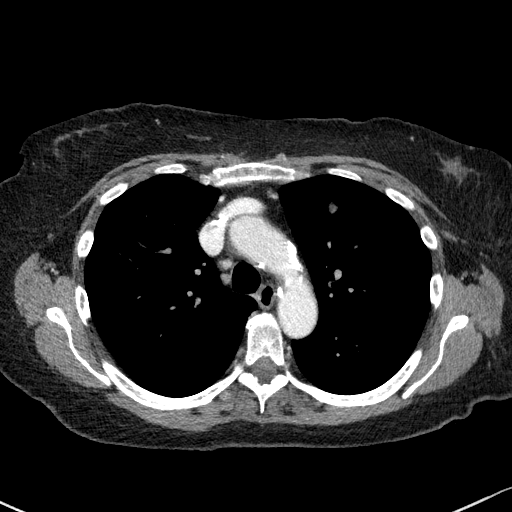
[im 104/151  lung]
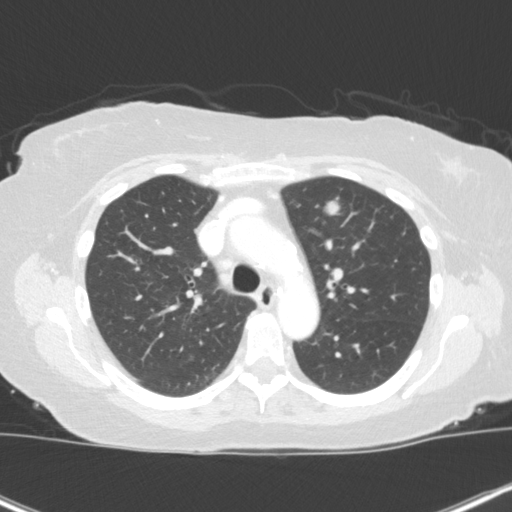
[im 116/151  lung]
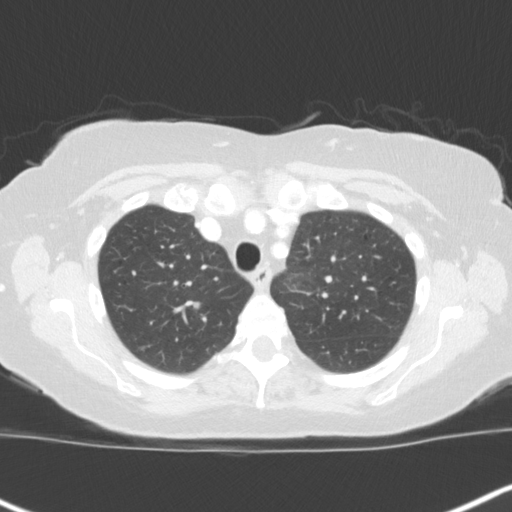
[im 127/151  lung]
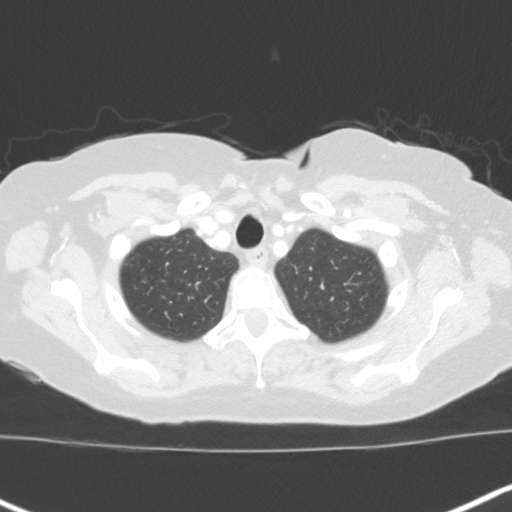
[im 139/151  lung]
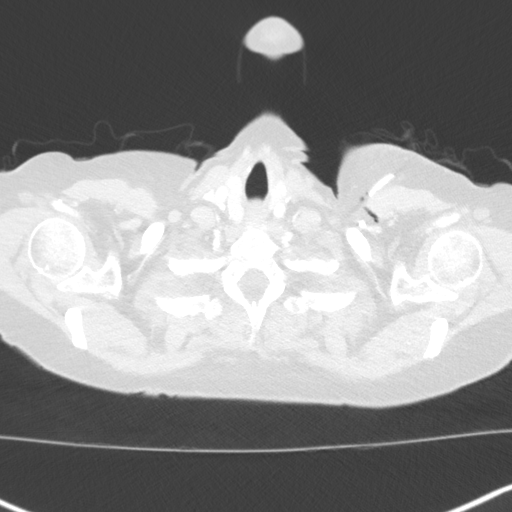

[Series 5: coronal · coronal · 0.61mm/px · 3 of 110 slices shown]
[im 22/110  lung]
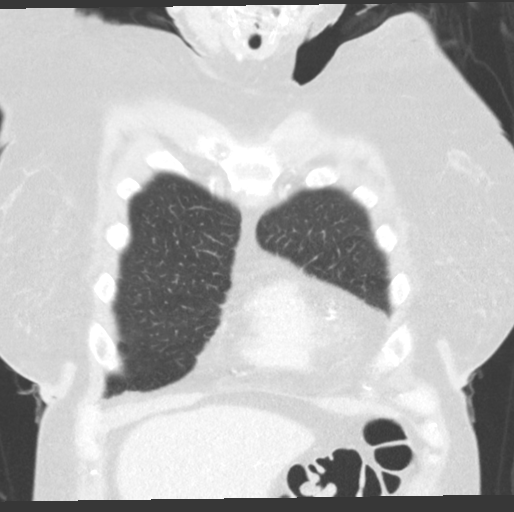
[im 44/110  lung]
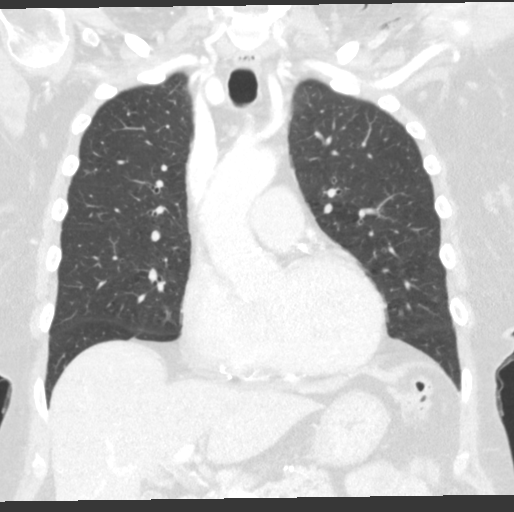
[im 66/110  lung]
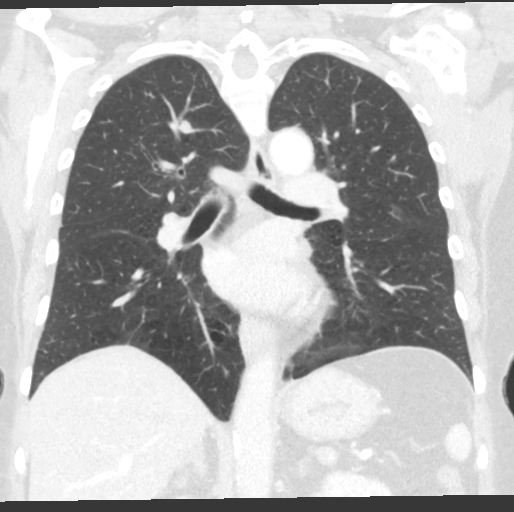

[15 of 36 positions shown; findings below may reference images not displayed]

FINDINGS: Cardiovascular: Normal heart size. Aortic atherosclerosis. Lad, left
circumflex and RCA coronary artery calcifications.

Mediastinum/Nodes: No enlarged mediastinal, hilar, or axillary lymph
nodes. Thyroid gland, trachea, and esophagus demonstrate no
significant findings.

Lungs/Pleura: No pleural effusion. No airspace consolidation,
atelectasis or pneumothorax. Several pulmonary nodules are
identified including:

Anteromedial left upper lobe nodule measures 1.0 cm, image 49/4. New
from previous exam.

Right upper lobe lung nodule measures 0.7 cm, image 38/4. Also new
from previous exam.

Posterior left upper lobe lung nodule measures 2 mm, image 65/4. New
from previous exam.

Upper Abdomen: No acute abnormality identified within the imaged
portions of the upper abdomen. Left adrenal lesion is again noted
measuring 3.1 x 2.6 cm, unchanged, image 145/2. Also unchanged is a
right adrenal nodule measuring 1.1 x 0.8 cm, image 147/2.

Musculoskeletal: No chest wall abnormality. No acute or significant
osseous findings.
IMPRESSION: 1. There are several pulmonary nodules identified within both lungs
measuring up to 1.0 cm. New from 07/28/2016 suspicious for
metastatic disease.
2. Multi vessel coronary artery calcifications noted.
3. Stable bilateral adrenal nodules.
4. Aortic atherosclerosis.

Aortic Atherosclerosis ([id number]-[id number].A).

## 2021-04-14 IMAGING — CT CT CHEST W/ CM
2 of 3 series · 11 of 35 positions shown, 13 images · IV contrast (Omnipaque or Isovue)
Comparison: January 07, 2020
COMPARISON: January 07, 2020

Addendum:
CLINICAL DATA: Endometrial cancer, follow-up, on chemotherapy
following hysterectomy.

EXAM:
CT CHEST, ABDOMEN, AND PELVIS WITH CONTRAST
TECHNIQUE: Multidetector CT imaging of the chest, abdomen and pelvis was
performed following the standard protocol during bolus
administration of intravenous contrast.
CONTRAST:  100mL OMNIPAQUE IOHEXOL 300 MG/ML  SOLN

[Series 2: cap with · axial · 0.71mm/px · z∈[+859,+1409]mm · 8 of 128 slices shown, 10 images]
[im 9/128  mediastinal]
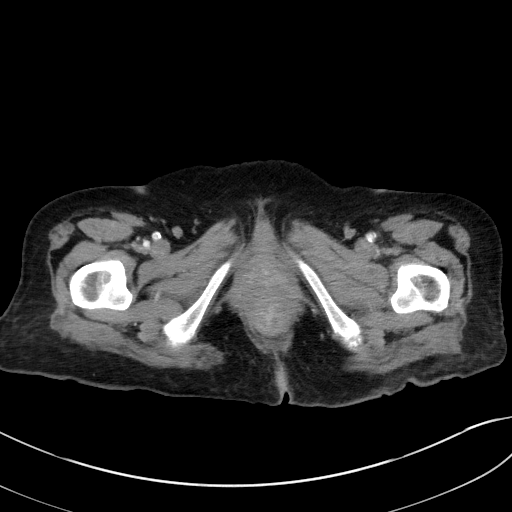
[im 9/128  lung]
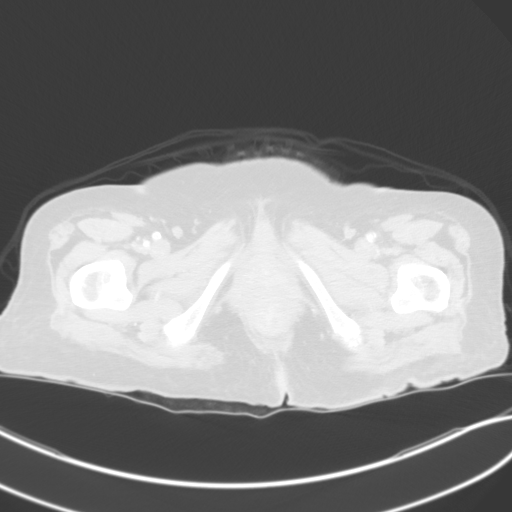
[im 26/128  lung]
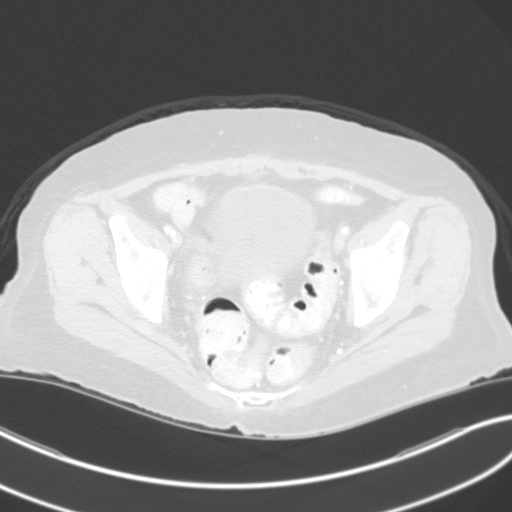
[im 43/128  lung]
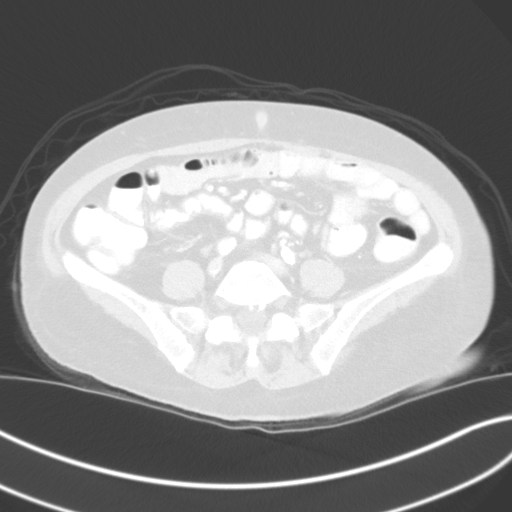
[im 60/128  lung]
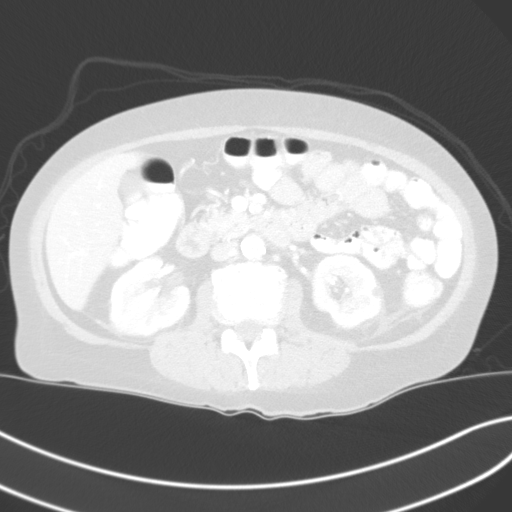
[im 68/128  mediastinal]
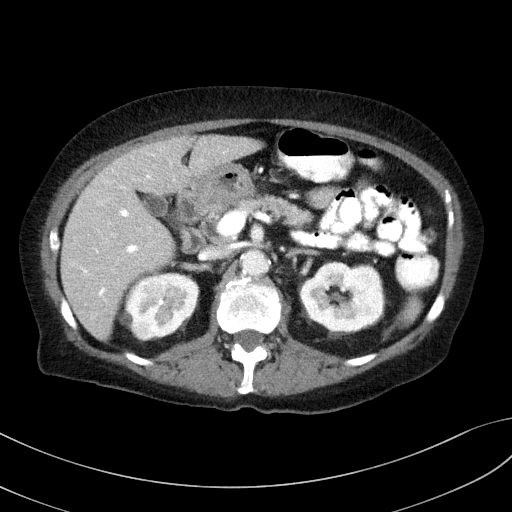
[im 68/128  lung]
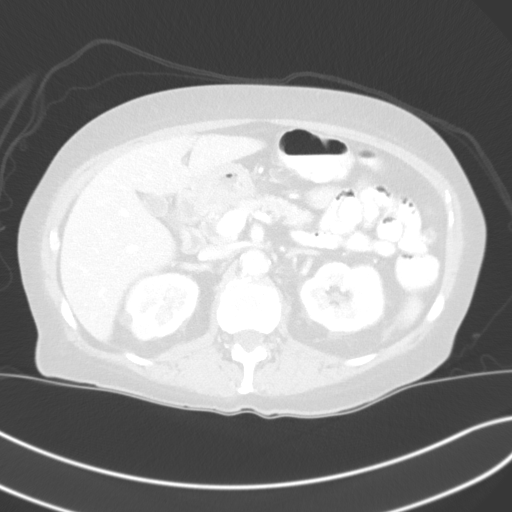
[im 85/128  lung]
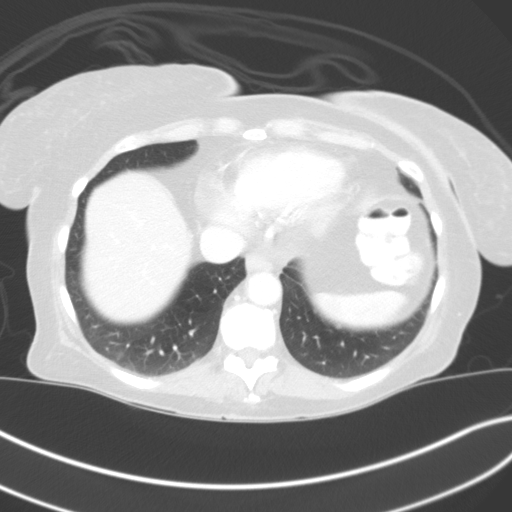
[im 102/128  lung]
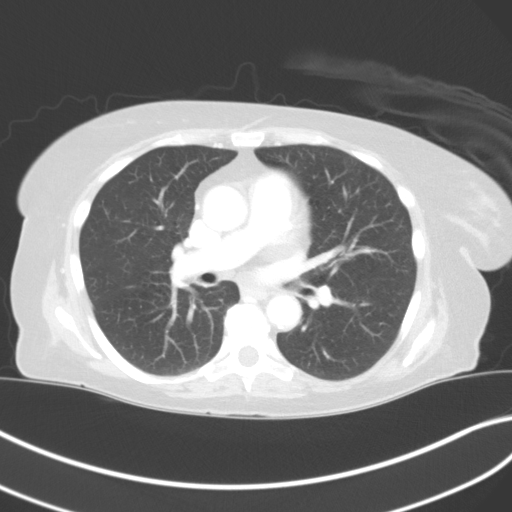
[im 119/128  lung]
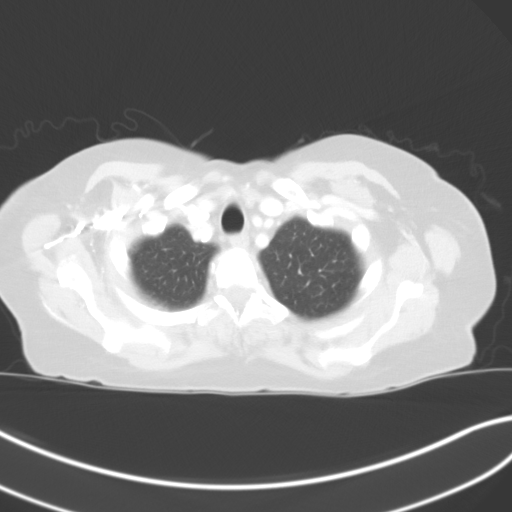

[Series 5: coronals · coronal · 0.86mm/px · 3 of 137 slices shown]
[im 28/137  lung]
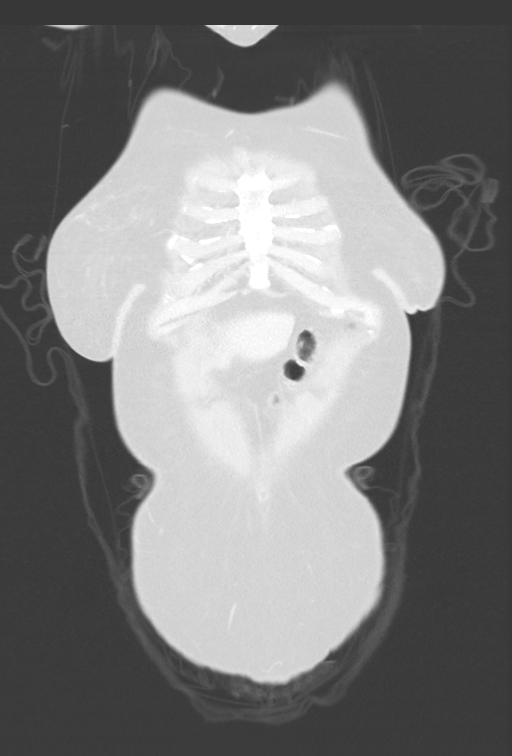
[im 55/137  lung]
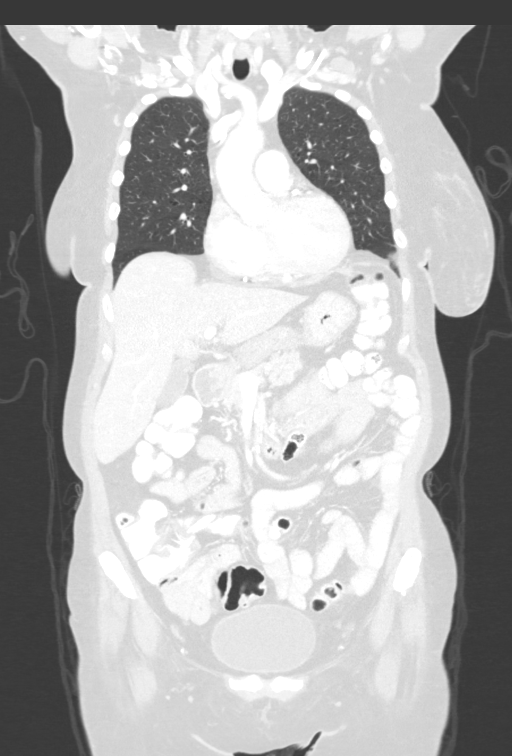
[im 82/137  lung]
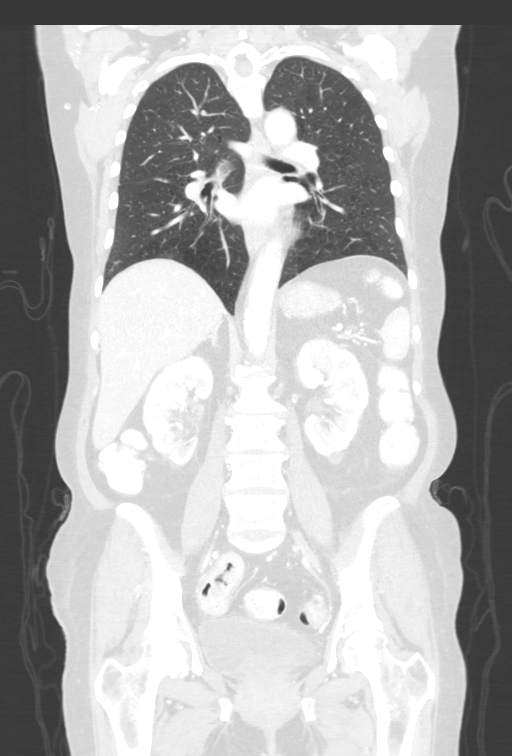

[11 of 35 positions shown; findings below may reference images not displayed]

FINDINGS: CT CHEST FINDINGS

Cardiovascular: Calcified and noncalcified atheromatous plaque in
the thoracic aorta. No aneurysmal dilation. The three-vessel
coronary artery disease. No pericardial effusion. Central pulmonary
arteries are unremarkable on venous phase assessment.

LEFT-sided Port-A-Cath terminates in the proximal to mid SVC.

Mediastinum/Nodes: Thoracic inlet structures are normal.

No axillary adenopathy. No mediastinal adenopathy. No hilar
adenopathy. Esophagus grossly normal.

Lungs/Pleura: No consolidation. No pleural effusion. Airways are
patent.

(Image 45, series [DATE] x 9 mm sub solid nodule

No effusion. No consolidation. No abnormality seen in this area on
previous imaging from 4284

Musculoskeletal: See below for full musculoskeletal detail. No chest
wall lesion.

CT ABDOMEN PELVIS FINDINGS

Hepatobiliary: Mildly lobular hepatic contours. Suggestion of
background hepatic steatosis. No focal, suspicious hepatic lesion.
No pericholecystic stranding. No biliary duct dilation.

Pancreas: Pancreas is normal.

Spleen: Spleen normal in size and contour.

Adrenals/Urinary Tract: LEFT adrenal mass measures approximately
x 2.2 cm previously 3.7 x 2.3 cm. RIGHT adrenal with mild nodularity
which is unchanged.

Renal cortical scarring and renal vascular calcifications with
similar appearance to the prior exam.

Striated nephrogram on the RIGHT subtle heterogeneity of the LEFT
renal nephrogram in the upper pole. RIGHT-sided abnormalities in the
interpolar and lower pole aspects of the RIGHT kidney. No
hydronephrosis. Urinary bladder grossly normal.

Stomach/Bowel: Stomach under distended. No acute gastrointestinal
process. Appendix is normal. Colonic diverticulosis.

Vascular/Lymphatic: Calcific and noncalcific atheromatous plaque of
the abdominal aorta. No aneurysmal dilation. No adenopathy in the
retroperitoneum. Portal vein is patent. Hepatic veins are patent.

No pelvic adenopathy.

Reproductive: Post hysterectomy.

Other: No ascites.

Musculoskeletal: No acute musculoskeletal process.

New area of sclerosis in the LEFT iliac (image 236, series 4)
findings were not present previously this measures 15 mm.

(Image 229, 5 mm.

(Image 212, series 4) new sclerotic focus in L5 approximately 1 cm.
IMPRESSION: 1. New sclerotic bone lesions that are suspicious for metastatic
disease.
2. New area of sub solid nodule in the right upper lobe measuring 11
x 9 mm. No abnormality seen in this area on previous imaging from
4284 this area. Morphology and location raising the question of
primary pulmonary neoplasm. PET scan for above 2 findings may be
helpful for further assessment given patient history.
3. LEFT adrenal mass is similar 2 its appearance dating back to 4284
where it was characterized as adenoma on a prior MRI.
4. Findings are concerning for bilateral pyelonephritis, worse on
the RIGHT. Correlate with urinalysis.
5. Hepatic steatosis.
6. Aortic atherosclerosis.

Aortic Atherosclerosis ([id number]-WDY.Y).

ADDENDUM:
These results will be called to the ordering clinician or
representative by the Radiologist Assistant, and communication
documented in the PACS or [REDACTED].

*** End of Addendum ***
FINDINGS: CT CHEST FINDINGS

Cardiovascular: Calcified and noncalcified atheromatous plaque in
the thoracic aorta. No aneurysmal dilation. The three-vessel
coronary artery disease. No pericardial effusion. Central pulmonary
arteries are unremarkable on venous phase assessment.

LEFT-sided Port-A-Cath terminates in the proximal to mid SVC.

Mediastinum/Nodes: Thoracic inlet structures are normal.

No axillary adenopathy. No mediastinal adenopathy. No hilar
adenopathy. Esophagus grossly normal.

Lungs/Pleura: No consolidation. No pleural effusion. Airways are
patent.

(Image 45, series [DATE] x 9 mm sub solid nodule

No effusion. No consolidation. No abnormality seen in this area on
previous imaging from 4284

Musculoskeletal: See below for full musculoskeletal detail. No chest
wall lesion.

CT ABDOMEN PELVIS FINDINGS

Hepatobiliary: Mildly lobular hepatic contours. Suggestion of
background hepatic steatosis. No focal, suspicious hepatic lesion.
No pericholecystic stranding. No biliary duct dilation.

Pancreas: Pancreas is normal.

Spleen: Spleen normal in size and contour.

Adrenals/Urinary Tract: LEFT adrenal mass measures approximately
x 2.2 cm previously 3.7 x 2.3 cm. RIGHT adrenal with mild nodularity
which is unchanged.

Renal cortical scarring and renal vascular calcifications with
similar appearance to the prior exam.

Striated nephrogram on the RIGHT subtle heterogeneity of the LEFT
renal nephrogram in the upper pole. RIGHT-sided abnormalities in the
interpolar and lower pole aspects of the RIGHT kidney. No
hydronephrosis. Urinary bladder grossly normal.

Stomach/Bowel: Stomach under distended. No acute gastrointestinal
process. Appendix is normal. Colonic diverticulosis.

Vascular/Lymphatic: Calcific and noncalcific atheromatous plaque of
the abdominal aorta. No aneurysmal dilation. No adenopathy in the
retroperitoneum. Portal vein is patent. Hepatic veins are patent.

No pelvic adenopathy.

Reproductive: Post hysterectomy.

Other: No ascites.

Musculoskeletal: No acute musculoskeletal process.

New area of sclerosis in the LEFT iliac (image 236, series 4)
findings were not present previously this measures 15 mm.

(Image 229, 5 mm.

(Image 212, series 4) new sclerotic focus in L5 approximately 1 cm.
IMPRESSION: 1. New sclerotic bone lesions that are suspicious for metastatic
disease.
2. New area of sub solid nodule in the right upper lobe measuring 11
x 9 mm. No abnormality seen in this area on previous imaging from
4284 this area. Morphology and location raising the question of
primary pulmonary neoplasm. PET scan for above 2 findings may be
helpful for further assessment given patient history.
3. LEFT adrenal mass is similar 2 its appearance dating back to 4284
where it was characterized as adenoma on a prior MRI.
4. Findings are concerning for bilateral pyelonephritis, worse on
the RIGHT. Correlate with urinalysis.
5. Hepatic steatosis.
6. Aortic atherosclerosis.

Aortic Atherosclerosis ([id number].Y).
